# Patient Record
Sex: Female | Born: 1950
Health system: Southern US, Community
[De-identification: ages and names within clinical notes are randomized; demographics above are authoritative.]

## PROBLEM LIST (undated history)

## (undated) DIAGNOSIS — E785 Hyperlipidemia, unspecified: Secondary | ICD-10-CM

## (undated) DIAGNOSIS — F32A Depression, unspecified: Secondary | ICD-10-CM

## (undated) DIAGNOSIS — E7800 Pure hypercholesterolemia, unspecified: Secondary | ICD-10-CM

## (undated) DIAGNOSIS — Z974 Presence of external hearing-aid: Secondary | ICD-10-CM

## (undated) DIAGNOSIS — R112 Nausea with vomiting, unspecified: Secondary | ICD-10-CM

## (undated) DIAGNOSIS — M1612 Unilateral primary osteoarthritis, left hip: Secondary | ICD-10-CM

## (undated) DIAGNOSIS — Z860100 Personal history of colon polyps, unspecified: Secondary | ICD-10-CM

## (undated) DIAGNOSIS — M5412 Radiculopathy, cervical region: Secondary | ICD-10-CM

## (undated) DIAGNOSIS — B019 Varicella without complication: Secondary | ICD-10-CM

## (undated) DIAGNOSIS — R002 Palpitations: Secondary | ICD-10-CM

## (undated) DIAGNOSIS — D649 Anemia, unspecified: Secondary | ICD-10-CM

## (undated) DIAGNOSIS — I447 Left bundle-branch block, unspecified: Secondary | ICD-10-CM

## (undated) DIAGNOSIS — M1990 Unspecified osteoarthritis, unspecified site: Secondary | ICD-10-CM

## (undated) DIAGNOSIS — T753XXA Motion sickness, initial encounter: Secondary | ICD-10-CM

## (undated) DIAGNOSIS — N390 Urinary tract infection, site not specified: Secondary | ICD-10-CM

## (undated) DIAGNOSIS — F329 Major depressive disorder, single episode, unspecified: Secondary | ICD-10-CM

## (undated) DIAGNOSIS — Z9889 Other specified postprocedural states: Secondary | ICD-10-CM

## (undated) HISTORY — DX: Urinary tract infection, site not specified: N39.0

## (undated) HISTORY — DX: Major depressive disorder, single episode, unspecified: F32.9

## (undated) HISTORY — DX: Personal history of colonic polyps: Z86.010

## (undated) HISTORY — PX: JOINT REPLACEMENT: SHX530

## (undated) HISTORY — DX: Depression, unspecified: F32.A

## (undated) HISTORY — DX: Varicella without complication: B01.9

## (undated) HISTORY — PX: TUBAL LIGATION: SHX77

## (undated) HISTORY — DX: Personal history of colon polyps, unspecified: Z86.0100

---

## 1985-06-09 HISTORY — PX: ORIF ULNAR FRACTURE: SHX5417

## 1993-06-09 HISTORY — PX: TUBAL LIGATION: SHX77

## 2003-06-10 HISTORY — PX: BREAST SURGERY: SHX581

## 2005-06-09 HISTORY — PX: AUGMENTATION MAMMAPLASTY: SUR837

## 2010-08-28 ENCOUNTER — Ambulatory Visit: Payer: Self-pay | Admitting: Family Medicine

## 2011-06-23 ENCOUNTER — Ambulatory Visit: Payer: Self-pay | Admitting: Unknown Physician Specialty

## 2012-07-06 ENCOUNTER — Ambulatory Visit: Payer: Self-pay | Admitting: Family Medicine

## 2012-07-08 ENCOUNTER — Ambulatory Visit: Payer: Self-pay | Admitting: Family Medicine

## 2012-07-12 ENCOUNTER — Ambulatory Visit: Payer: Self-pay | Admitting: Family Medicine

## 2013-02-16 ENCOUNTER — Ambulatory Visit: Payer: Self-pay | Admitting: Family Medicine

## 2013-07-20 LAB — TSH: TSH: 2.4 u[IU]/mL (ref 0.41–5.90)

## 2013-07-20 LAB — BASIC METABOLIC PANEL
BUN: 12 mg/dL (ref 4–21)
Creatinine: 0.8 mg/dL (ref 0.5–1.1)
GLUCOSE: 91 mg/dL
SODIUM: 141 mmol/L (ref 137–147)

## 2013-07-20 LAB — CBC AND DIFFERENTIAL: WBC: 4.8 10*3/mL

## 2013-08-11 ENCOUNTER — Ambulatory Visit: Payer: Self-pay | Admitting: Family Medicine

## 2014-04-10 ENCOUNTER — Ambulatory Visit: Payer: Self-pay | Admitting: Obstetrics and Gynecology

## 2014-11-01 ENCOUNTER — Encounter: Payer: Self-pay | Admitting: Nurse Practitioner

## 2014-11-01 ENCOUNTER — Ambulatory Visit (INDEPENDENT_AMBULATORY_CARE_PROVIDER_SITE_OTHER): Payer: 59 | Admitting: Nurse Practitioner

## 2014-11-01 ENCOUNTER — Encounter (INDEPENDENT_AMBULATORY_CARE_PROVIDER_SITE_OTHER): Payer: Self-pay

## 2014-11-01 VITALS — BP 102/68 | HR 57 | Temp 98.0°F | Resp 16 | Ht 69.0 in | Wt 140.8 lb

## 2014-11-01 DIAGNOSIS — F324 Major depressive disorder, single episode, in partial remission: Secondary | ICD-10-CM | POA: Diagnosis not present

## 2014-11-01 DIAGNOSIS — F325 Major depressive disorder, single episode, in full remission: Secondary | ICD-10-CM

## 2014-11-01 DIAGNOSIS — R1011 Right upper quadrant pain: Secondary | ICD-10-CM

## 2014-11-01 DIAGNOSIS — Z7689 Persons encountering health services in other specified circumstances: Secondary | ICD-10-CM

## 2014-11-01 DIAGNOSIS — Z0000 Encounter for general adult medical examination without abnormal findings: Secondary | ICD-10-CM

## 2014-11-01 LAB — LIPID PANEL
Cholesterol: 219 mg/dL — ABNORMAL HIGH (ref 0–200)
HDL: 69.7 mg/dL (ref >39.00)
LDL CALC: 132 mg/dL — AB (ref 0–99)
NonHDL: 149.3
Total CHOL/HDL Ratio: 3
Triglycerides: 85 mg/dL (ref 0.0–149.0)
VLDL: 17 mg/dL (ref 0.0–40.0)

## 2014-11-01 LAB — COMPREHENSIVE METABOLIC PANEL
ALBUMIN: 4.1 g/dL (ref 3.5–5.2)
ALK PHOS: 48 U/L (ref 39–117)
ALT: 12 U/L (ref 0–35)
AST: 17 U/L (ref 0–37)
BUN: 10 mg/dL (ref 6–23)
CHLORIDE: 105 meq/L (ref 96–112)
CO2: 27 meq/L (ref 19–32)
Calcium: 9.3 mg/dL (ref 8.4–10.5)
Creatinine, Ser: 0.71 mg/dL (ref 0.40–1.20)
GFR: 88.47 mL/min (ref >60.00)
GLUCOSE: 99 mg/dL (ref 70–99)
Potassium: 4.3 mEq/L (ref 3.5–5.1)
SODIUM: 138 meq/L (ref 135–145)
Total Bilirubin: 1 mg/dL (ref 0.2–1.2)
Total Protein: 6.6 g/dL (ref 6.0–8.3)

## 2014-11-01 LAB — CBC WITH DIFFERENTIAL/PLATELET
Basophils Absolute: 0 10*3/uL (ref 0.0–0.1)
Basophils Relative: 0.6 % (ref 0.0–3.0)
EOS ABS: 0 10*3/uL (ref 0.0–0.7)
EOS PCT: 0.3 % (ref 0.0–5.0)
HEMATOCRIT: 33.4 % — AB (ref 36.0–46.0)
Hemoglobin: 11.5 g/dL — ABNORMAL LOW (ref 12.0–15.0)
Lymphocytes Relative: 31.5 % (ref 12.0–46.0)
Lymphs Abs: 1.7 10*3/uL (ref 0.7–4.0)
MCHC: 34.3 g/dL (ref 30.0–36.0)
MCV: 93.2 fl (ref 78.0–100.0)
MONOS PCT: 3.4 % (ref 3.0–12.0)
Monocytes Absolute: 0.2 10*3/uL (ref 0.1–1.0)
Neutro Abs: 3.4 10*3/uL (ref 1.4–7.7)
Neutrophils Relative %: 64.2 % (ref 43.0–77.0)
Platelets: 265 10*3/uL (ref 150.0–400.0)
RBC: 3.58 Mil/uL — ABNORMAL LOW (ref 3.87–5.11)
RDW: 13.3 % (ref 11.5–15.5)
WBC: 5.4 10*3/uL (ref 4.0–10.5)

## 2014-11-01 LAB — HEMOGLOBIN A1C: HEMOGLOBIN A1C: 5.6 % (ref 4.6–6.5)

## 2014-11-01 LAB — TSH: TSH: 1.53 u[IU]/mL (ref 0.35–4.50)

## 2014-11-01 NOTE — Progress Notes (Signed)
Pre visit review using our clinic review tool, if applicable. No additional management support is needed unless otherwise documented below in the visit note. 

## 2014-11-01 NOTE — Patient Instructions (Signed)
See you in 1 year for your annual physical.   You can call anytime you have concerns or illness.  Please visit the lab before leaving today.   Welcome to [facility]!

## 2014-11-01 NOTE — Progress Notes (Signed)
Subjective:    Patient ID: Angelica Newton, female    DOB: [birth year], 64 y.o.   MRN: [id number]  HPI  Angelica Newton is a 64 yo female establishing care and need of physical. Does not need PAP or breast exam today.   1) Health Maintenance-   Diet- Does not eat a lot of red meat   Exercise- Spinning 3-4 x a week, 1 hour, 1 hour other gym times   Immunizations- Flu 2016    tdap 2015   Mammogram- 2015, November- Dense breast tissue (Goes with ultrasound screenings)   Pap- 2015, normal results    Bone Density- Over 6 years ago   Colonoscopy- Due last year, insurance wanted her to go to     [place].   Eye Exam- 02/2014, glasses   Dental Exam- UTD  2) Chronic Problems-  Depression- Went through a divorce, was on Lexapro. Now   improved after divorce.   Feels like turtle neck on intermittently for years- no problems ever   found after work up by another provider  Feels RUQ "getting her attention", no episodes recently, also had   worked up with no findings.   Review of Systems  Constitutional: Positive for chills and fatigue. Negative for fever and diaphoresis.       Intermittent fatigue and chills 2 weeks ago   HENT: Negative for tinnitus and trouble swallowing.        Bilateral hearing aids  Eyes: Negative for visual disturbance.  Respiratory: Negative for chest tightness, shortness of breath and wheezing.   Cardiovascular: Negative for chest pain, palpitations and leg swelling.  Gastrointestinal: Negative for nausea, vomiting, diarrhea and constipation.  Genitourinary: Negative for difficulty urinating.  Musculoskeletal: Negative for back pain and neck pain.  Skin: Negative for rash.  Neurological: Negative for dizziness, syncope, weakness, numbness and headaches.  Hematological: Does not bruise/bleed easily.  Psychiatric/Behavioral: Negative for suicidal ideas and sleep disturbance. The patient is not nervous/anxious.    Past Medical History  Diagnosis Date  . Chicken pox     . Depression   . UTI (urinary tract infection)     History   Social History  . Marital Status: Single    Spouse Name: N/A  . Number of Children: N/A  . Years of Education: N/A   Occupational History  . Not on file.   Social History Main Topics  . Smoking status: Former Smoker    Types: Cigarettes    Quit date: 06/09/2010  . Smokeless tobacco: Never Used  . Alcohol Use: 0.6 oz/week    1 Glasses of wine per week  . Drug Use: No  . Sexual Activity:    Partners: Male   Other Topics Concern  . Not on file   Social History Narrative   Retired, but volunteers    Lives by herself   Children- 2 (2 and 74 yo)    Pets: None    Caffeine: 2 cups in the morning, diet coke rarely    Right handed    Enjoys bike riding, cooking, traveling        Past Surgical History  Procedure Laterality Date  . Orif ulnar fracture  1987    Broken Ulna/plates and screws  . Breast surgery  2005    Breast Lift    Family History  Problem Relation Age of Onset  . Hyperlipidemia Mother   . Stroke Mother   . Diabetes Mother   . Hyperlipidemia Father   . Stroke Father   .  Hypertension Father   . Kidney failure Father   . Diabetes Father   . Cancer Brother     squamous cell carcinoma  . Diabetes Maternal Grandmother   . Cancer Paternal Grandmother     Breast    Allergies  Allergen Reactions  . Tetracyclines & Related Other (See Comments)    Just felt lousey  . Zocor [Simvastatin] Other (See Comments)    Flu type symptoms  . Mango Butter Rash    Swollen lips    No current outpatient prescriptions on file prior to visit.   No current facility-administered medications on file prior to visit.      Objective:   Physical Exam  Constitutional: She is oriented to person, place, and time. She appears well-developed and well-nourished. No distress.  BP 102/68 mmHg  Pulse 57  Temp(Src) 98 F (36.7 C)  Resp 16  Ht 5\' 9"  (1.753 m)  Wt 140 lb 12.8 oz (63.866 kg)  BMI 20.78 kg/m2   SpO2 99%   HENT:  Head: Normocephalic and atraumatic.  Right Ear: External ear normal.  Left Ear: External ear normal.  Nose: Nose normal.  Mouth/Throat: Oropharynx is clear and moist. No oropharyngeal exudate.  TMs and canals clear bilaterally  Eyes: Conjunctivae and EOM are normal. Pupils are equal, round, and reactive to light. Right eye exhibits no discharge. Left eye exhibits no discharge. No scleral icterus.  Neck: Normal range of motion. Neck supple. No thyromegaly present.  Cardiovascular: Normal rate, regular rhythm, normal heart sounds and intact distal pulses.  Exam reveals no gallop and no friction rub.   No murmur heard. Pulmonary/Chest: Effort normal and breath sounds normal. No respiratory distress. She has no wheezes. She has no rales. She exhibits no tenderness.  Abdominal: Soft. Bowel sounds are normal. She exhibits no distension and no mass. There is no tenderness. There is no rebound and no guarding.  Genitourinary:  Deferred  Musculoskeletal: Normal range of motion. She exhibits no edema or tenderness.  Lymphadenopathy:    She has no cervical adenopathy.  Neurological: She is alert and oriented to person, place, and time. She has normal reflexes. No cranial nerve deficit. She exhibits normal muscle tone. Coordination normal.  Skin: Skin is warm and dry. No rash noted. She is not diaphoretic. No erythema. No pallor.  Psychiatric: She has a normal mood and affect. Her behavior is normal. Judgment and thought content normal.      Assessment & Plan:

## 2014-11-16 DIAGNOSIS — F325 Major depressive disorder, single episode, in full remission: Secondary | ICD-10-CM | POA: Insufficient documentation

## 2014-11-16 DIAGNOSIS — Z7689 Persons encountering health services in other specified circumstances: Secondary | ICD-10-CM | POA: Insufficient documentation

## 2014-11-16 DIAGNOSIS — R1011 Right upper quadrant pain: Secondary | ICD-10-CM | POA: Insufficient documentation

## 2014-11-16 DIAGNOSIS — Z0000 Encounter for general adult medical examination without abnormal findings: Secondary | ICD-10-CM | POA: Insufficient documentation

## 2014-11-16 NOTE — Assessment & Plan Note (Signed)
Will obtain records from previous facility. Patient came for physical and did not need breast or PAP exam today.

## 2014-11-16 NOTE — Assessment & Plan Note (Signed)
No problems today. Improved. Asked pt to follow up if flares in the future.

## 2014-11-16 NOTE — Assessment & Plan Note (Signed)
Discussed acute and chronic issues. Reviewed health maintenance measures, PFSHx, and immunizations. Obtain routine labs TSH, Lipid panel, CBC w/ diff, A1c, and CMET.

## 2014-11-16 NOTE — Assessment & Plan Note (Signed)
Patient improved and no longer on medication. States her divorced helped her.

## 2015-01-10 ENCOUNTER — Telehealth: Payer: Self-pay | Admitting: *Deleted

## 2015-01-10 NOTE — Telephone Encounter (Signed)
Patient called and was wanting a referral to [facility] Dr. Evorn Gong. Pt states she has a mole on Rt side that is bothering her. Pt states that she mentioned it during her Annual physical.  . Pt is requesting a call back if anything comes up.  She will be out of town her contact # is [phone]. Please advise NP

## 2015-01-11 ENCOUNTER — Other Ambulatory Visit: Payer: Self-pay | Admitting: Nurse Practitioner

## 2015-01-11 DIAGNOSIS — D229 Melanocytic nevi, unspecified: Secondary | ICD-10-CM

## 2015-01-29 ENCOUNTER — Ambulatory Visit (INDEPENDENT_AMBULATORY_CARE_PROVIDER_SITE_OTHER): Payer: 59 | Admitting: Nurse Practitioner

## 2015-01-29 VITALS — BP 116/82 | HR 57 | Temp 98.1°F | Resp 16 | Ht 69.0 in | Wt 143.6 lb

## 2015-01-29 DIAGNOSIS — M79671 Pain in right foot: Secondary | ICD-10-CM

## 2015-01-29 NOTE — Progress Notes (Signed)
Patient ID: Jowana Thumma, female    DOB: 04-09-1951  Age: 64 y.o. MRN: [id number]  CC: Foot Pain   HPI Deltha Bernales presents for right foot pain x 7 days.   1) Bottom of foot tingling, right foot 1 week No treatment to date. No history of diabetes. Patient reports no other symptoms or changes to skin  History [place] has a past medical history of Chicken pox; Depression; and UTI (urinary tract infection).   She has past surgical history that includes ORIF ulnar fracture (8101) and Breast surgery (2005).   Her family history includes Cancer in her brother and paternal grandmother; Diabetes in her father, maternal grandmother, and mother; Hyperlipidemia in her father and mother; Hypertension in her father; Kidney failure in her father; Stroke in her father and mother.She reports that she quit smoking about 4 years ago. Her smoking use included Cigarettes. She has never used smokeless tobacco. She reports that she drinks about 0.6 oz of alcohol per week. She reports that she does not use illicit drugs.  Outpatient Prescriptions Prior to Visit  Medication Sig Dispense Refill  . calcium-vitamin D (OSCAL WITH D) 250-125 MG-UNIT per tablet Take 1 tablet by mouth daily.    . cholecalciferol (VITAMIN D) 1000 UNITS tablet Take 1,000 Units by mouth daily.    . LUTEIN PO Take 1 tablet by mouth daily.     No facility-administered medications prior to visit.    ROS Review of Systems  Constitutional: Negative for fever, chills, diaphoresis and fatigue.  Musculoskeletal: Positive for arthralgias.       Right foot is tingling  Neurological: Positive for numbness.       Right foot    Objective:  BP 116/82 mmHg  Pulse 57  Temp(Src) 98.1 F (36.7 C)  Resp 16  Ht 5\' 9"  (1.753 m)  Wt 143 lb 9.6 oz (65.137 kg)  BMI 21.20 kg/m2  SpO2 97%  Physical Exam  Constitutional: She is oriented to person, place, and time. She appears well-developed and well-nourished. No distress.   HENT:  Head: Normocephalic and atraumatic.  Right Ear: External ear normal.  Left Ear: External ear normal.  Neurological: She is alert and oriented to person, place, and time. No cranial nerve deficit. She exhibits normal muscle tone. Coordination normal.  Monofilament exam is normal on right  Skin: Skin is warm and dry. No rash noted. She is not diaphoretic.  Psychiatric: She has a normal mood and affect. Her behavior is normal. Judgment and thought content normal.   Assessment & Plan:   There are no diagnoses linked to this encounter. I am having Ms. Fonte maintain her cholecalciferol, LUTEIN PO, and calcium-vitamin D.  No orders of the defined types were placed in this encounter.     Follow-up: No Follow-up on file.

## 2015-01-29 NOTE — Progress Notes (Signed)
Pre visit review using our clinic review tool, if applicable. No additional management support is needed unless otherwise documented below in the visit note. 

## 2015-01-29 NOTE — Patient Instructions (Signed)
Rest, Ice, and Advil/ibuprofen/Aspirin as needed   Call me if this worsens or persists over 7 more days.

## 2015-02-08 ENCOUNTER — Encounter: Payer: Self-pay | Admitting: Nurse Practitioner

## 2015-02-08 DIAGNOSIS — M79671 Pain in right foot: Secondary | ICD-10-CM | POA: Insufficient documentation

## 2015-02-08 NOTE — Assessment & Plan Note (Signed)
Uncontrolled. Patient and I discussed possible treatment methods. Patient decided to go with rest, ice, ibuprofen. Asked patient to call in 7 days if not improving.

## 2015-05-10 ENCOUNTER — Ambulatory Visit (INDEPENDENT_AMBULATORY_CARE_PROVIDER_SITE_OTHER): Payer: 59 | Admitting: Nurse Practitioner

## 2015-05-10 ENCOUNTER — Encounter: Payer: Self-pay | Admitting: Nurse Practitioner

## 2015-05-10 VITALS — BP 118/72 | HR 55 | Temp 98.0°F | Wt 142.0 lb

## 2015-05-10 DIAGNOSIS — E785 Hyperlipidemia, unspecified: Secondary | ICD-10-CM | POA: Diagnosis not present

## 2015-05-10 DIAGNOSIS — D649 Anemia, unspecified: Secondary | ICD-10-CM | POA: Insufficient documentation

## 2015-05-10 DIAGNOSIS — Z23 Encounter for immunization: Secondary | ICD-10-CM

## 2015-05-10 LAB — CBC WITH DIFFERENTIAL/PLATELET
BASOS PCT: 0.8 % (ref 0.0–3.0)
Basophils Absolute: 0 10*3/uL (ref 0.0–0.1)
Eosinophils Absolute: 0.1 10*3/uL (ref 0.0–0.7)
Eosinophils Relative: 1.3 % (ref 0.0–5.0)
HEMATOCRIT: 38.4 % (ref 36.0–46.0)
HEMOGLOBIN: 12.8 g/dL (ref 12.0–15.0)
LYMPHS PCT: 45 % (ref 12.0–46.0)
Lymphs Abs: 1.9 10*3/uL (ref 0.7–4.0)
MCHC: 33.3 g/dL (ref 30.0–36.0)
MCV: 95.4 fl (ref 78.0–100.0)
MONO ABS: 0.3 10*3/uL (ref 0.1–1.0)
Monocytes Relative: 6.3 % (ref 3.0–12.0)
Neutro Abs: 2 10*3/uL (ref 1.4–7.7)
Neutrophils Relative %: 46.6 % (ref 43.0–77.0)
PLATELETS: 228 10*3/uL (ref 150.0–400.0)
RBC: 4.03 Mil/uL (ref 3.87–5.11)
RDW: 13.3 % (ref 11.5–15.5)
WBC: 4.3 10*3/uL (ref 4.0–10.5)

## 2015-05-10 LAB — LIPID PANEL
CHOL/HDL RATIO: 3
Cholesterol: 204 mg/dL — ABNORMAL HIGH (ref 0–200)
HDL: 63.2 mg/dL (ref >39.00)
LDL CALC: 127 mg/dL — AB (ref 0–99)
NONHDL: 140.34
Triglycerides: 65 mg/dL (ref 0.0–149.0)
VLDL: 13 mg/dL (ref 0.0–40.0)

## 2015-05-10 NOTE — Progress Notes (Signed)
Patient ID: [name], female    DOB: [birth year]  Age: 64 y.o. MRN: [id number]  CC: Follow-up   HPI Angelica Newton presents for follow up of lipids and cbc w/ diff, flu vaccine and CC of neck spasm.   1) Put x-mas tree in trunk Sharp pain in neck. Has improved on its own today without anything. Mother in town for 10 days and she reports there is stress related to this.   2)Repeat cholesterol and CBC w/ diff today Patient is fasting She reports giving blood regularly and is due to go Sat. She reports she has never had that problem before with being turned down due to low hgb.   History Angelica Newton has a past medical history of Chicken pox; Depression; and UTI (urinary tract infection).   She has past surgical history that includes ORIF ulnar fracture [id number]) and Breast surgery (2005).   Her family history includes Cancer in her brother and paternal grandmother; Diabetes in her father, maternal grandmother, and mother; Hyperlipidemia in her father and mother; Hypertension in her father; Kidney failure in her father; Stroke in her father and mother.She reports that she quit smoking about 4 years ago. Her smoking use included Cigarettes. She has never used smokeless tobacco. She reports that she drinks about 0.6 oz of alcohol per week. She reports that she does not use illicit drugs.  Outpatient Prescriptions Prior to Visit  Medication Sig Dispense Refill  . calcium-vitamin D (OSCAL WITH D) 250-125 MG-UNIT per tablet Take 1 tablet by mouth daily.    . cholecalciferol (VITAMIN D) 1000 UNITS tablet Take 1,000 Units by mouth daily.    . LUTEIN PO Take 1 tablet by mouth daily.     No facility-administered medications prior to visit.    ROS Review of Systems  Constitutional: Negative for fever, chills, diaphoresis and fatigue.  Respiratory: Negative for chest tightness, shortness of breath and wheezing.   Cardiovascular: Negative for chest pain, palpitations and leg swelling.   Gastrointestinal: Negative for nausea, vomiting and diarrhea.  Musculoskeletal: Positive for myalgias.       Muscle pain around left shoulder  Skin: Negative for rash.  Neurological: Negative for dizziness, weakness, numbness and headaches.  Psychiatric/Behavioral: The patient is not nervous/anxious.     Objective:  BP 118/72 mmHg  Pulse 55  Temp(Src) 98 F (36.7 C) (Oral)  Wt 142 lb (64.411 kg)  SpO2 98%  Physical Exam  Constitutional: She is oriented to person, place, and time. She appears well-developed and well-nourished. No distress.  HENT:  Head: Normocephalic and atraumatic.  Right Ear: External ear normal.  Left Ear: External ear normal.  Cardiovascular: Normal rate, regular rhythm and normal heart sounds.  Exam reveals no gallop and no friction rub.   No murmur heard. Pulmonary/Chest: Effort normal and breath sounds normal. No respiratory distress. She has no wheezes. She has no rales. She exhibits no tenderness.  Musculoskeletal: Normal range of motion. She exhibits tenderness. She exhibits no edema.  Slightly tender to palpation Left Trapezius muscle. No tenderness of shoulder joint or scapula. Full ROM left shoulder and neck  Neurological: She is alert and oriented to person, place, and time. No cranial nerve deficit. She exhibits normal muscle tone. Coordination normal.  Skin: Skin is warm and dry. No rash noted. She is not diaphoretic.  Psychiatric: She has a normal mood and affect. Her behavior is normal. Judgment and thought content normal.   Assessment & Plan:   Angelica Newton was seen today for  follow-up.  Diagnoses and all orders for this visit:  Hyperlipidemia -     Lipid Profile  Anemia, unspecified anemia type -     CBC with Differential/Platelet  Need for prophylactic vaccination and inoculation against influenza -     Flu Vaccine QUAD 36+ mos IM   I am having Ms. Cuellar maintain her cholecalciferol, LUTEIN PO, and calcium-vitamin D.  No orders of  the defined types were placed in this encounter.     Follow-up: Return in about 1 year (around 05/09/2016) for CPE w/ fasting labs.

## 2015-05-10 NOTE — Assessment & Plan Note (Signed)
Repeat lipid panel today. Pt is not on therapy at this time and is a fairly healthy eater. Will follow after results

## 2015-05-10 NOTE — Patient Instructions (Addendum)
Please visit the lab today.   See your for physical next year and Happy Holidays!

## 2015-05-10 NOTE — Assessment & Plan Note (Addendum)
Pt had anemia on last CBC w/ diff. Will repeat today. Denies bleeding from any orifice. Does bruise easily she reports. She gives blood regularly (next time is Sat), and reports they have not had any concerns with anemia.

## 2015-08-27 ENCOUNTER — Encounter: Payer: Self-pay | Admitting: Nurse Practitioner

## 2015-08-27 ENCOUNTER — Ambulatory Visit (INDEPENDENT_AMBULATORY_CARE_PROVIDER_SITE_OTHER): Payer: 59 | Admitting: Nurse Practitioner

## 2015-08-27 VITALS — BP 112/72 | HR 66 | Temp 98.1°F | Resp 14 | Ht 69.0 in | Wt 144.2 lb

## 2015-08-27 DIAGNOSIS — M76891 Other specified enthesopathies of right lower limb, excluding foot: Secondary | ICD-10-CM

## 2015-08-27 DIAGNOSIS — Z1211 Encounter for screening for malignant neoplasm of colon: Secondary | ICD-10-CM | POA: Diagnosis not present

## 2015-08-27 DIAGNOSIS — M65851 Other synovitis and tenosynovitis, right thigh: Secondary | ICD-10-CM

## 2015-08-27 DIAGNOSIS — Z1239 Encounter for other screening for malignant neoplasm of breast: Secondary | ICD-10-CM

## 2015-08-27 NOTE — Patient Instructions (Signed)
[facility]  Address: [address] [place], [place] [address]  Phone: [phone] Hours:  Monday - Thursday: 8 a.m. - 5 p.m. Friday: 8 a.m. - 3 p.m.   We will call about your colonoscopy.   Follow up in 3 months.

## 2015-08-27 NOTE — Progress Notes (Signed)
Patient ID: Angelica Newton, female    DOB: Mar 22, 1951  Age: 65 y.o. MRN: [id number]  CC: Follow-up and Hip Pain   HPI Zarahi Foor presents for CC of colonoscopy need, mammogram, and right hip pain.   1) Colonoscopy- Dr. Allen Norris or Vira Agar   2) Right hip  Family history of hip arthritis  Trouble tying shoes  Aleve, ice, stretching, topicals without relief   3) Wants to order mammogram  History [place] has a past medical history of Chicken pox; Depression; and UTI (urinary tract infection).   She has past surgical history that includes ORIF ulnar fracture [id number]) and Breast surgery (2005).   Her family history includes Cancer in her brother and paternal grandmother; Diabetes in her father, maternal grandmother, and mother; Hyperlipidemia in her father and mother; Hypertension in her father; Kidney failure in her father; Stroke in her father and mother.She reports that she quit smoking about 5 years ago. Her smoking use included Cigarettes. She has never used smokeless tobacco. She reports that she drinks about 0.6 oz of alcohol per week. She reports that she does not use illicit drugs.  Outpatient Prescriptions Prior to Visit  Medication Sig Dispense Refill  . calcium-vitamin D (OSCAL WITH D) 250-125 MG-UNIT per tablet Take 1 tablet by mouth daily.    . cholecalciferol (VITAMIN D) 1000 UNITS tablet Take 1,000 Units by mouth daily. Reported on 08/27/2015    . LUTEIN PO Take 1 tablet by mouth daily. Reported on 08/27/2015     No facility-administered medications prior to visit.    ROS Review of Systems  Constitutional: Negative for fever, chills, diaphoresis and fatigue.  Respiratory: Negative for chest tightness, shortness of breath and wheezing.   Cardiovascular: Negative for chest pain, palpitations and leg swelling.  Gastrointestinal: Negative for nausea, vomiting and diarrhea.  Skin: Negative for rash.  Neurological: Negative for dizziness and headaches.     Objective:  BP 112/72 mmHg  Pulse 66  Temp(Src) 98.1 F (36.7 C) (Oral)  Resp 14  Ht 5\' 9"  (1.753 m)  Wt 144 lb 3.2 oz (65.409 kg)  BMI 21.29 kg/m2  SpO2 97%  Physical Exam  Constitutional: She is oriented to person, place, and time. She appears well-developed and well-nourished. No distress.  HENT:  Head: Normocephalic and atraumatic.  Right Ear: External ear normal.  Left Ear: External ear normal.  Cardiovascular: Normal rate and regular rhythm.   Pulmonary/Chest: Effort normal and breath sounds normal. No respiratory distress. She has no wheezes. She has no rales. She exhibits no tenderness.  Neurological: She is alert and oriented to person, place, and time. No cranial nerve deficit. She exhibits normal muscle tone. Coordination normal.  Skin: Skin is warm and dry. No rash noted. She is not diaphoretic.  Psychiatric: She has a normal mood and affect. Her behavior is normal. Judgment and thought content normal.    Assessment & Plan:   Angelica Newton was seen today for follow-up and hip pain.  Diagnoses and all orders for this visit:  Screening for breast cancer -     MM SCREENING BREAST TOMO BILATERAL; Future  Special screening for malignant neoplasms, colon -     Ambulatory referral to Gastroenterology   I am having Ms. Montigny maintain her cholecalciferol, LUTEIN PO, and calcium-vitamin D.  No orders of the defined types were placed in this encounter.     Follow-up: Return in about 3 months (around 11/27/2015) for Follow up for right hip and epigastric pain.

## 2015-09-02 DIAGNOSIS — Z0001 Encounter for general adult medical examination with abnormal findings: Secondary | ICD-10-CM | POA: Insufficient documentation

## 2015-09-02 DIAGNOSIS — Z1211 Encounter for screening for malignant neoplasm of colon: Secondary | ICD-10-CM | POA: Insufficient documentation

## 2015-09-02 DIAGNOSIS — M25551 Pain in right hip: Secondary | ICD-10-CM | POA: Insufficient documentation

## 2015-09-02 NOTE — Assessment & Plan Note (Signed)
Tight hip flexors and likely a tendinitis  Light stretching ice and NSAIDs

## 2015-09-02 NOTE — Assessment & Plan Note (Signed)
Ordered mammogram.

## 2015-09-02 NOTE — Assessment & Plan Note (Signed)
Amb referral for colonoscopy placed

## 2015-09-05 ENCOUNTER — Telehealth: Payer: Self-pay

## 2015-09-05 ENCOUNTER — Other Ambulatory Visit: Payer: Self-pay

## 2015-09-05 NOTE — Telephone Encounter (Signed)
Gastroenterology Pre-Procedure Review  Request Date: 09/17/15 Requesting Physician: Lorane Gell, NP  PATIENT REVIEW QUESTIONS: The patient responded to the following health history questions as indicated:    1. Are you having any GI issues? no 2. Do you have a personal history of Polyps? yes (repeat every 10 years) 3. Do you have a family history of Colon Cancer or Polyps? yes (Maternal Aunt, colon cancer- unsure) 4. Diabetes Mellitus? no 5. Joint replacements in the past 12 months?no 6. Major health problems in the past 3 months?no 7. Any artificial heart valves, MVP, or defibrillator?no    MEDICATIONS & ALLERGIES:    Patient reports the following regarding taking any anticoagulation/antiplatelet therapy:   Plavix, Coumadin, Eliquis, Xarelto, Lovenox, Pradaxa, Brilinta, or Effient? no Aspirin? no  Patient confirms/reports the following medications:  Current Outpatient Prescriptions  Medication Sig Dispense Refill  . calcium-vitamin D (OSCAL WITH D) 250-125 MG-UNIT per tablet Take 1 tablet by mouth daily.    . cholecalciferol (VITAMIN D) 1000 UNITS tablet Take 1,000 Units by mouth daily. Reported on 08/27/2015    . LUTEIN PO Take 1 tablet by mouth daily. Reported on 08/27/2015     No current facility-administered medications for this visit.    Patient confirms/reports the following allergies:  Allergies  Allergen Reactions  . Lipitor [Atorvastatin]     "feels terrible"  . Tetracyclines & Related Other (See Comments)    Just felt lousey  . Zocor [Simvastatin] Other (See Comments)    Flu type symptoms  . Mango Butter Rash    Swollen lips    No orders of the defined types were placed in this encounter.    AUTHORIZATION INFORMATION Primary Insurance: 1D#: Group #:  Secondary Insurance: 1D#: Group #:  SCHEDULE INFORMATION: Date: 09/17/15 Time: Location: [facility]

## 2015-09-12 ENCOUNTER — Encounter: Payer: Self-pay | Admitting: *Deleted

## 2015-09-13 NOTE — Discharge Instructions (Signed)

## 2015-09-17 ENCOUNTER — Ambulatory Visit: Payer: No Typology Code available for payment source | Admitting: Anesthesiology

## 2015-09-17 ENCOUNTER — Encounter
Admission: RE | Disposition: A | Discharge status: Home / Self Care | Payer: Self-pay | Source: Ambulatory Visit | Attending: Gastroenterology

## 2015-09-17 ENCOUNTER — Ambulatory Visit
Admission: RE | Admit: 2015-09-17 | Discharge: 2015-09-17 | Disposition: A | Discharge status: Home / Self Care | Payer: No Typology Code available for payment source | Source: Ambulatory Visit | Attending: Gastroenterology | Admitting: Gastroenterology

## 2015-09-17 DIAGNOSIS — Z881 Allergy status to other antibiotic agents status: Secondary | ICD-10-CM | POA: Insufficient documentation

## 2015-09-17 DIAGNOSIS — F329 Major depressive disorder, single episode, unspecified: Secondary | ICD-10-CM | POA: Diagnosis not present

## 2015-09-17 DIAGNOSIS — Z8249 Family history of ischemic heart disease and other diseases of the circulatory system: Secondary | ICD-10-CM | POA: Diagnosis not present

## 2015-09-17 DIAGNOSIS — Z808 Family history of malignant neoplasm of other organs or systems: Secondary | ICD-10-CM | POA: Diagnosis not present

## 2015-09-17 DIAGNOSIS — Z1211 Encounter for screening for malignant neoplasm of colon: Secondary | ICD-10-CM | POA: Diagnosis not present

## 2015-09-17 DIAGNOSIS — Z888 Allergy status to other drugs, medicaments and biological substances status: Secondary | ICD-10-CM | POA: Insufficient documentation

## 2015-09-17 DIAGNOSIS — D125 Benign neoplasm of sigmoid colon: Secondary | ICD-10-CM | POA: Insufficient documentation

## 2015-09-17 DIAGNOSIS — M1611 Unilateral primary osteoarthritis, right hip: Secondary | ICD-10-CM | POA: Diagnosis not present

## 2015-09-17 DIAGNOSIS — Z91018 Allergy to other foods: Secondary | ICD-10-CM | POA: Insufficient documentation

## 2015-09-17 DIAGNOSIS — Z841 Family history of disorders of kidney and ureter: Secondary | ICD-10-CM | POA: Diagnosis not present

## 2015-09-17 DIAGNOSIS — Z87891 Personal history of nicotine dependence: Secondary | ICD-10-CM | POA: Insufficient documentation

## 2015-09-17 DIAGNOSIS — Z823 Family history of stroke: Secondary | ICD-10-CM | POA: Insufficient documentation

## 2015-09-17 DIAGNOSIS — H9193 Unspecified hearing loss, bilateral: Secondary | ICD-10-CM | POA: Diagnosis not present

## 2015-09-17 DIAGNOSIS — Z803 Family history of malignant neoplasm of breast: Secondary | ICD-10-CM | POA: Insufficient documentation

## 2015-09-17 DIAGNOSIS — Z833 Family history of diabetes mellitus: Secondary | ICD-10-CM | POA: Diagnosis not present

## 2015-09-17 DIAGNOSIS — Z79899 Other long term (current) drug therapy: Secondary | ICD-10-CM | POA: Insufficient documentation

## 2015-09-17 HISTORY — DX: Presence of external hearing-aid: Z97.4

## 2015-09-17 HISTORY — DX: Unspecified osteoarthritis, unspecified site: M19.90

## 2015-09-17 HISTORY — DX: Other specified postprocedural states: Z98.890

## 2015-09-17 HISTORY — DX: Motion sickness, initial encounter: T75.3XXA

## 2015-09-17 HISTORY — DX: Other specified postprocedural states: R11.2

## 2015-09-17 HISTORY — PX: COLONOSCOPY WITH PROPOFOL: SHX5780

## 2015-09-17 HISTORY — PX: POLYPECTOMY: SHX149

## 2015-09-17 SURGERY — COLONOSCOPY WITH PROPOFOL
Anesthesia: Monitor Anesthesia Care | Wound class: Contaminated

## 2015-09-17 MED ORDER — OXYCODONE HCL 5 MG PO TABS: 5.0000 mg | ORAL_TABLET | Freq: Once | ORAL | Status: DC | PRN

## 2015-09-17 MED ORDER — LACTATED RINGERS IV SOLN
INTRAVENOUS | Status: DC
  Administered 2015-09-17: 07:00:00 via INTRAVENOUS

## 2015-09-17 MED ORDER — LIDOCAINE HCL (CARDIAC) 20 MG/ML IV SOLN
INTRAVENOUS | Status: DC | PRN
  Administered 2015-09-17: 40 mg via INTRAVENOUS

## 2015-09-17 MED ORDER — LACTATED RINGERS IV SOLN: INTRAVENOUS | Status: DC

## 2015-09-17 MED ORDER — OXYCODONE HCL 5 MG/5ML PO SOLN: 5.0000 mg | Freq: Once | ORAL | Status: DC | PRN

## 2015-09-17 MED ORDER — PROPOFOL 10 MG/ML IV BOLUS
INTRAVENOUS | Status: DC | PRN
  Administered 2015-09-17 (×2): 20 mg via INTRAVENOUS
  Administered 2015-09-17: 30 mg via INTRAVENOUS
  Administered 2015-09-17 (×3): 20 mg via INTRAVENOUS
  Administered 2015-09-17: 70 mg via INTRAVENOUS
  Administered 2015-09-17: 20 mg via INTRAVENOUS

## 2015-09-17 SURGICAL SUPPLY — 21 items
CANISTER SUCT 1200ML W/VALVE (MISCELLANEOUS) ×4 IMPLANT
CLIP HMST 235XBRD CATH ROT (MISCELLANEOUS) IMPLANT
CLIP RESOLUTION 360 11X235 (MISCELLANEOUS)
FCP ESCP3.2XJMB 240X2.8X (MISCELLANEOUS)
FORCEPS BIOP RAD 4 LRG CAP 4 (CUTTING FORCEPS) IMPLANT
FORCEPS BIOP RJ4 240 W/NDL (MISCELLANEOUS)
FORCEPS ESCP3.2XJMB 240X2.8X (MISCELLANEOUS) IMPLANT
GOWN CVR UNV OPN BCK APRN NK (MISCELLANEOUS) ×4 IMPLANT
GOWN ISOL THUMB LOOP REG UNIV (MISCELLANEOUS) ×4
INJECTOR VARIJECT VIN23 (MISCELLANEOUS) IMPLANT
KIT DEFENDO VALVE AND CONN (KITS) IMPLANT
KIT ENDO PROCEDURE OLY (KITS) ×4 IMPLANT
MARKER SPOT ENDO TATTOO 5ML (MISCELLANEOUS) IMPLANT
PAD GROUND ADULT SPLIT (MISCELLANEOUS) IMPLANT
PROBE APC STR FIRE (PROBE) ×4 IMPLANT
SNARE SHORT THROW 13M SML OVAL (MISCELLANEOUS) ×4 IMPLANT
SNARE SHORT THROW 30M LRG OVAL (MISCELLANEOUS) IMPLANT
SPOT EX ENDOSCOPIC TATTOO (MISCELLANEOUS)
VARIJECT INJECTOR VIN23 (MISCELLANEOUS)
WATER STERILE IRR 250ML POUR (IV SOLUTION) ×4 IMPLANT
WIDE-EYE POLYPTRAP (MISCELLANEOUS) ×4 IMPLANT

## 2015-09-17 NOTE — Anesthesia Procedure Notes (Signed)
Procedure Name: MAC Performed by: Delailah Spieth Pre-anesthesia Checklist: Patient identified, Emergency Drugs available, Suction available, Timeout performed and Patient being monitored Patient Re-evaluated:Patient Re-evaluated prior to inductionOxygen Delivery Method: Nasal cannula Placement Confirmation: positive ETCO2       

## 2015-09-17 NOTE — H&P (Signed)
[facility]  [address], [place], [place] [address] Phone: [phone] Fax : [phone]  Primary Care Physician:  Rubbie Battiest, NP Primary Gastroenterologist:  Dr. Allen Norris  Pre-Procedure History & Physical: HPI:  Angelica Newton is a 65 y.o. female is here for a screening colonoscopy.   Past Medical History  Diagnosis Date  . Chicken pox   . Depression   . UTI (urinary tract infection)   . PONV (postoperative nausea and vomiting)   . Motion sickness     in past  . Arthritis     Right hip  . Wears hearing aid     bilateral    Past Surgical History  Procedure Laterality Date  . Orif ulnar fracture  1987    Broken Ulna/plates and screws  . Breast surgery  2005    Breast Lift  . Tubal ligation      Prior to Admission medications   Medication Sig Start Date End Date Taking? Authorizing Provider  calcium-vitamin D (OSCAL WITH D) 250-125 MG-UNIT per tablet Take 1 tablet by mouth daily.   Yes Historical Provider, MD  cholecalciferol (VITAMIN D) 1000 UNITS tablet Take 1,000 Units by mouth daily. Reported on 08/27/2015   Yes Historical Provider, MD  LUTEIN PO Take 1 tablet by mouth daily. Reported on 08/27/2015   Yes Historical Provider, MD    Allergies as of 09/05/2015 - Review Complete 09/05/2015  Allergen Reaction Noted  . Lipitor [atorvastatin]  05/10/2015  . Tetracyclines & related Other (See Comments) 11/01/2014  . Zocor [simvastatin] Other (See Comments) 11/01/2014  . Mango butter Rash 11/01/2014    Family History  Problem Relation Age of Onset  . Hyperlipidemia Mother   . Stroke Mother   . Diabetes Mother   . Hyperlipidemia Father   . Stroke Father   . Hypertension Father   . Kidney failure Father   . Diabetes Father   . Cancer Brother     squamous cell carcinoma  . Diabetes Maternal Grandmother   . Cancer Paternal Grandmother     Breast    Social History   Social History  . Marital Status: Single    Spouse Name: N/A    . Number of Children: N/A  . Years of Education: N/A   Occupational History  . Not on file.   Social History Main Topics  . Smoking status: Former Smoker    Types: Cigarettes    Quit date: 06/09/2010  . Smokeless tobacco: Never Used  . Alcohol Use: 6.0 oz/week    10 Glasses of wine per week  . Drug Use: No  . Sexual Activity:    Partners: Male   Other Topics Concern  . Not on file   Social History Narrative   Retired, but volunteers    Lives by herself   Children- 2 (79 and 60 yo)    Pets: None    Caffeine: 2 cups in the morning, diet coke rarely    Right handed    Enjoys bike riding, cooking, traveling        Review of Systems: See HPI, otherwise negative ROS  Physical Exam: BP 108/72 mmHg  Pulse 63  Temp(Src) 97.5 F (36.4 C) (Temporal)  Resp 16  Ht 5\' 9"  (1.753 m)  Wt 143 lb (64.864 kg)  BMI 21.11 kg/m2  SpO2 100% General:   Alert,  pleasant and cooperative in NAD Head:  Normocephalic and atraumatic. Neck:  Supple; no masses or thyromegaly. Lungs:  Clear  throughout to auscultation.    Heart:  Regular rate and rhythm. Abdomen:  Soft, nontender and nondistended. Normal bowel sounds, without guarding, and without rebound.   Neurologic:  Alert and  oriented x4;  grossly normal neurologically.  Impression/Plan: Angelica Newton is now here to undergo a screening colonoscopy.  Risks, benefits, and alternatives regarding colonoscopy have been reviewed with the patient.  Questions have been answered.  All parties agreeable.

## 2015-09-17 NOTE — Transfer of Care (Signed)
Immediate Anesthesia Transfer of Care Note  Patient: Angelica Newton  Procedure(s) Performed: Procedure(s) with comments: COLONOSCOPY WITH PROPOFOL (N/A) POLYPECTOMY INTESTINAL - Sigmoid colon polyp  Patient Location: PACU  Anesthesia Type: MAC  Level of Consciousness: awake, alert  and patient cooperative  Airway and Oxygen Therapy: Patient Spontanous Breathing and Patient connected to supplemental oxygen  Post-op Assessment: Post-op Vital signs reviewed, Patient's Cardiovascular Status Stable, Respiratory Function Stable, Patent Airway and No signs of Nausea or vomiting  Post-op Vital Signs: Reviewed and stable  Complications: No apparent anesthesia complications

## 2015-09-17 NOTE — Anesthesia Postprocedure Evaluation (Signed)
Anesthesia Post Note  Patient: Angelica Newton  Procedure(s) Performed: Procedure(s) (LRB): COLONOSCOPY WITH PROPOFOL (N/A) POLYPECTOMY INTESTINAL  Patient location during evaluation: PACU Anesthesia Type: MAC Level of consciousness: awake and alert Pain management: pain level controlled Vital Signs Assessment: post-procedure vital signs reviewed and stable Respiratory status: spontaneous breathing and respiratory function stable Cardiovascular status: stable Anesthetic complications: no    Jaci Standard, III,  Rett Stehlik D

## 2015-09-17 NOTE — Op Note (Signed)
[facility] Gastroenterology Patient Name: Angelica Newton Procedure Date: 09/17/2015 8:26 AM MRN: [id number] Account #: [id number] Date of Birth: 29-Feb-1951 Admit Type: Outpatient Age: 65 Room: [facility] OR ROOM 01 Gender: Female Note Status: Finalized Procedure:            Colonoscopy Indications:          Screening for colorectal malignant neoplasm Providers:            Lucilla Lame, MD Referring MD:         Velora Heckler. Doss (Referring MD) Medicines:            Propofol per Anesthesia Complications:        No immediate complications. Procedure:            Pre-Anesthesia Assessment:                       - Prior to the procedure, a History and Physical was                        performed, and patient medications and allergies were                        reviewed. The patient's tolerance of previous                        anesthesia was also reviewed. The risks and benefits of                        the procedure and the sedation options and risks were                        discussed with the patient. All questions were                        answered, and informed consent was obtained. Prior                        Anticoagulants: The patient has taken no previous                        anticoagulant or antiplatelet agents. ASA Grade                        Assessment: II - A patient with mild systemic disease.                        After reviewing the risks and benefits, the patient was                        deemed in satisfactory condition to undergo the                        procedure.                       After obtaining informed consent, the colonoscope was                        passed under direct vision. Throughout the procedure,  the patient's blood pressure, pulse, and oxygen                        saturations were monitored continuously. The Olympus CF                        H180AL colonoscope (S#: [id number]) was introduced through                      the anus and advanced to the the cecum, identified by                        appendiceal orifice and ileocecal valve. The                        colonoscopy was performed without difficulty. The                        patient tolerated the procedure well. The quality of                        the bowel preparation was excellent. Findings:      The perianal and digital rectal examinations were normal.      A 7 mm polyp was found in the sigmoid colon. The polyp was pedunculated.       The polyp was removed with a cold snare. Resection and retrieval were       complete. Impression:           - One 7 mm polyp in the sigmoid colon, removed with a                        cold snare. Resected and retrieved. Recommendation:       - Await pathology results.                       - Repeat colonoscopy in 5 years if polyp adenoma and 10                        years if hyperplastic Procedure Code(s):    --- Professional ---                       [phone], Colonoscopy, flexible; with removal of tumor(s),                        polyp(s), or other lesion(s) by snare technique Diagnosis Code(s):    --- Professional ---                       Z12.11, Encounter for screening for malignant neoplasm                        of colon                       D12.5, Benign neoplasm of sigmoid colon CPT copyright 2016 American Medical Association. All rights reserved. The codes documented in this report are preliminary and upon coder review may  be revised to meet current compliance requirements. Lucilla Lame, MD 09/17/2015 8:47:07 AM This report has been signed electronically. Number of Addenda: 0 Note Initiated On:  09/17/2015 8:26 AM Scope Withdrawal Time: 0 hours 6 minutes 28 seconds  Total Procedure Duration: 0 hours 11 minutes 16 seconds       [facility]

## 2015-09-17 NOTE — Anesthesia Preprocedure Evaluation (Signed)
Anesthesia Evaluation  Patient identified by MRN, date of birth, ID band Patient awake    Reviewed: Allergy & Precautions, H&P , NPO status , Patient's Chart, lab work & pertinent test results  History of Anesthesia Complications (+) history of anesthetic complications (ponv)  Airway Mallampati: I  TM Distance: >3 FB Neck ROM: full    Dental no notable dental hx.    Pulmonary neg pulmonary ROS, former smoker,    Pulmonary exam normal        Cardiovascular Normal cardiovascular exam     Neuro/Psych    GI/Hepatic negative GI ROS, Neg liver ROS,   Endo/Other  negative endocrine ROS  Renal/GU negative Renal ROS     Musculoskeletal   Abdominal   Peds  Hematology negative hematology ROS (+)   Anesthesia Other Findings   Reproductive/Obstetrics                             Anesthesia Physical Anesthesia Plan  ASA: I  Anesthesia Plan: MAC   Post-op Pain Management:    Induction:   Airway Management Planned:   Additional Equipment:   Intra-op Plan:   Post-operative Plan:   Informed Consent: I have reviewed the patients History and Physical, chart, labs and discussed the procedure including the risks, benefits and alternatives for the proposed anesthesia with the patient or authorized representative who has indicated his/her understanding and acceptance.     Plan Discussed with: CRNA  Anesthesia Plan Comments:         Anesthesia Quick Evaluation

## 2015-09-18 ENCOUNTER — Encounter: Payer: Self-pay | Admitting: Gastroenterology

## 2015-09-26 ENCOUNTER — Ambulatory Visit
Admission: RE | Admit: 2015-09-26 | Discharge: 2015-09-26 | Disposition: A | Discharge status: Home / Self Care | Payer: No Typology Code available for payment source | Source: Ambulatory Visit | Attending: Nurse Practitioner | Admitting: Nurse Practitioner

## 2015-09-26 DIAGNOSIS — Z1239 Encounter for other screening for malignant neoplasm of breast: Secondary | ICD-10-CM

## 2015-09-26 DIAGNOSIS — Z1231 Encounter for screening mammogram for malignant neoplasm of breast: Secondary | ICD-10-CM | POA: Insufficient documentation

## 2015-11-28 ENCOUNTER — Ambulatory Visit (INDEPENDENT_AMBULATORY_CARE_PROVIDER_SITE_OTHER): Payer: No Typology Code available for payment source

## 2015-11-28 ENCOUNTER — Ambulatory Visit (INDEPENDENT_AMBULATORY_CARE_PROVIDER_SITE_OTHER): Payer: No Typology Code available for payment source | Admitting: Family Medicine

## 2015-11-28 ENCOUNTER — Ambulatory Visit: Payer: 59 | Admitting: Nurse Practitioner

## 2015-11-28 ENCOUNTER — Encounter: Payer: Self-pay | Admitting: Family Medicine

## 2015-11-28 VITALS — BP 102/62 | HR 69 | Temp 98.2°F | Ht 69.0 in | Wt 143.1 lb

## 2015-11-28 DIAGNOSIS — M25551 Pain in right hip: Secondary | ICD-10-CM

## 2015-11-28 DIAGNOSIS — E785 Hyperlipidemia, unspecified: Secondary | ICD-10-CM | POA: Diagnosis not present

## 2015-11-28 DIAGNOSIS — R21 Rash and other nonspecific skin eruption: Secondary | ICD-10-CM | POA: Diagnosis not present

## 2015-11-28 NOTE — Assessment & Plan Note (Signed)
Suspect rash was contact dermatitis related to sunscreen patient use. Advised to avoid that sunscreen. Continue monitor as it is improving. Given return precautions.

## 2015-11-28 NOTE — Progress Notes (Signed)
Patient ID: Angelica Newton, female   DOB: 27-Aug-1950, 65 y.o.   MRN: [id number]  Angelica Rumps, MD Phone: [phone]  Angelica Newton is a 65 y.o. female who presents today for follow-up.  Hyperlipidemia: No longer on medications. Mildly elevated LDL. She works on diet and exercise. Exercises 5 days a week by riding a bike. Fish oil. Fruit. Little red meat. Lots of lean cuisine. No longer smoking. No chest pain, shortness of breath, or myalgias.  Right hip pain: Chronic issue. Hurts with lifting leg at the hip. Not debilitating. Notes strong family history. Has never had an x-ray of her hip.  Rash: Patient reports rash on legs and arms from applying sunscreen when in the [place] recently. Was very itchy. Has improved significantly since returning from the [place]. Did take some ibuprofen for this.  PMH: former smoker   ROS see history of present illness  Objective  Physical Exam Filed Vitals:   11/28/15 1340  BP: 102/62  Pulse: 69  Temp: 98.2 F (36.8 C)    BP Readings from Last 3 Encounters:  11/28/15 102/62  09/17/15 109/61  08/27/15 112/72   Wt Readings from Last 3 Encounters:  11/28/15 143 lb 1.6 oz (64.91 kg)  09/17/15 143 lb (64.864 kg)  08/27/15 144 lb 3.2 oz (65.409 kg)    Physical Exam  Constitutional: She is well-developed, well-nourished, and in no distress.  HENT:  Head: Normocephalic and atraumatic.  Right Ear: External ear normal.  Left Ear: External ear normal.  Cardiovascular: Normal rate, regular rhythm and normal heart sounds.   Pulmonary/Chest: Effort normal and breath sounds normal.  Musculoskeletal:  No hip tenderness bilaterally, right hip with discomfort on internal range of motion and external range of motion, discomfort on hip flexor strength testing, left hip with no discomfort on range of motion, 5 out of 5 strength in bilateral hip flexors, quads, hamstrings, plantar flexion, and dorsiflexion, sensation light touch intact  bilateral extremities  Neurological: She is alert. Gait normal.  Skin: Skin is warm and dry. She is not diaphoretic.  Minimal faint erythematous papules still noticeable     Assessment/Plan: Please see individual problem list.  Right hip pain Given exam suspect right hip pain is due to osteoarthritis. Will obtain an x-ray of her right hip and pelvis to determine extent of arthritis.  Hyperlipidemia Has not tolerated statins. Is quite active with exercise. Diet is fairly good. Discussed continuing to monitor and rechecking cholesterol in 6 months.  Rash and nonspecific skin eruption Suspect rash was contact dermatitis related to sunscreen patient use. Advised to avoid that sunscreen. Continue monitor as it is improving. Given return precautions.    Orders Placed This Encounter  Procedures  . DG HIP UNILAT WITH PELVIS 2-3 VIEWS RIGHT    Standing Status: Future     Number of Occurrences: 1     Standing Expiration Date: 01/27/2017    Order Specific Question:  Reason for Exam (SYMPTOM  OR DIAGNOSIS REQUIRED)    Answer:  right hip pain, discomfort on internal ROM    Order Specific Question:  Preferred imaging location?    Answer:  [facility]    Angelica Rumps, MD [place]

## 2015-11-28 NOTE — Assessment & Plan Note (Signed)
Has not tolerated statins. Is quite active with exercise. Diet is fairly good. Discussed continuing to monitor and rechecking cholesterol in 6 months.

## 2015-11-28 NOTE — Patient Instructions (Signed)
Nice to meet you. We will obtain an x-ray of your right hip to evaluate further. Please continue diet and exercise. Please avoid sunscreen negative rash. If you develop recurrent rash, worsening hip pain, chest pain or shortness of breath, bleeding, or any new or changing symptoms please seek medical attention.

## 2015-11-28 NOTE — Progress Notes (Signed)
Pre visit review using our clinic review tool, if applicable. No additional management support is needed unless otherwise documented below in the visit note. 

## 2015-11-28 NOTE — Assessment & Plan Note (Signed)
Given exam suspect right hip pain is due to osteoarthritis. Will obtain an x-ray of her right hip and pelvis to determine extent of arthritis.

## 2016-05-07 ENCOUNTER — Encounter: Payer: Self-pay | Admitting: Family Medicine

## 2016-05-07 ENCOUNTER — Ambulatory Visit (INDEPENDENT_AMBULATORY_CARE_PROVIDER_SITE_OTHER): Payer: No Typology Code available for payment source | Admitting: Family Medicine

## 2016-05-07 ENCOUNTER — Other Ambulatory Visit: Payer: 59

## 2016-05-07 ENCOUNTER — Other Ambulatory Visit (HOSPITAL_COMMUNITY)
Admission: RE | Admit: 2016-05-07 | Discharge: 2016-05-07 | Disposition: A | Discharge status: Home / Self Care | Payer: No Typology Code available for payment source | Source: Ambulatory Visit | Attending: Family Medicine | Admitting: Family Medicine

## 2016-05-07 VITALS — BP 118/68 | HR 66 | Temp 98.4°F | Wt 143.8 lb

## 2016-05-07 DIAGNOSIS — Z01419 Encounter for gynecological examination (general) (routine) without abnormal findings: Secondary | ICD-10-CM | POA: Diagnosis present

## 2016-05-07 DIAGNOSIS — Z0000 Encounter for general adult medical examination without abnormal findings: Secondary | ICD-10-CM | POA: Diagnosis not present

## 2016-05-07 DIAGNOSIS — Z1151 Encounter for screening for human papillomavirus (HPV): Secondary | ICD-10-CM | POA: Insufficient documentation

## 2016-05-07 DIAGNOSIS — E785 Hyperlipidemia, unspecified: Secondary | ICD-10-CM

## 2016-05-07 DIAGNOSIS — Z130 Encounter for screening for diseases of the blood and blood-forming organs and certain disorders involving the immune mechanism: Secondary | ICD-10-CM

## 2016-05-07 DIAGNOSIS — Z1329 Encounter for screening for other suspected endocrine disorder: Secondary | ICD-10-CM

## 2016-05-07 DIAGNOSIS — Z124 Encounter for screening for malignant neoplasm of cervix: Secondary | ICD-10-CM

## 2016-05-07 LAB — COMPREHENSIVE METABOLIC PANEL
ALK PHOS: 45 U/L (ref 39–117)
ALT: 17 U/L (ref 0–35)
AST: 22 U/L (ref 0–37)
Albumin: 4.5 g/dL (ref 3.5–5.2)
BUN: 17 mg/dL (ref 6–23)
CHLORIDE: 105 meq/L (ref 96–112)
CO2: 28 mEq/L (ref 19–32)
Calcium: 9.5 mg/dL (ref 8.4–10.5)
Creatinine, Ser: 0.88 mg/dL (ref 0.40–1.20)
GFR: 68.72 mL/min (ref >60.00)
GLUCOSE: 87 mg/dL (ref 70–99)
POTASSIUM: 4.1 meq/L (ref 3.5–5.1)
SODIUM: 141 meq/L (ref 135–145)
TOTAL PROTEIN: 7.1 g/dL (ref 6.0–8.3)
Total Bilirubin: 0.8 mg/dL (ref 0.2–1.2)

## 2016-05-07 LAB — LIPID PANEL
Cholesterol: 248 mg/dL — ABNORMAL HIGH (ref 0–200)
HDL: 68 mg/dL (ref >39.00)
LDL Cholesterol: 162 mg/dL — ABNORMAL HIGH (ref 0–99)
NONHDL: 179.51
Total CHOL/HDL Ratio: 4
Triglycerides: 89 mg/dL (ref 0.0–149.0)
VLDL: 17.8 mg/dL (ref 0.0–40.0)

## 2016-05-07 LAB — TSH: TSH: 1.89 u[IU]/mL (ref 0.35–4.50)

## 2016-05-07 LAB — CBC
HEMATOCRIT: 38.2 % (ref 36.0–46.0)
HEMOGLOBIN: 13 g/dL (ref 12.0–15.0)
MCHC: 33.9 g/dL (ref 30.0–36.0)
MCV: 94.5 fl (ref 78.0–100.0)
Platelets: 239 10*3/uL (ref 150.0–400.0)
RBC: 4.04 Mil/uL (ref 3.87–5.11)
RDW: 13.4 % (ref 11.5–15.5)
WBC: 6.1 10*3/uL (ref 4.0–10.5)

## 2016-05-07 NOTE — Assessment & Plan Note (Addendum)
Doing well overall. Healthy weight. Blood pressure well controlled. Health maintenance up-to-date with exception of flu shot. She will return after she sees if her sniffles improve to get a flu shot. We will obtain lab work as outlined below. Pap smear completed. Breast exam completed. Most recent mammogram reviewed. She will monitor for recurrence of the catch that she described. She has a benign exam and has not had any recurrence.

## 2016-05-07 NOTE — Progress Notes (Signed)
Pre visit review using our clinic review tool, if applicable. No additional management support is needed unless otherwise documented below in the visit note. 

## 2016-05-07 NOTE — Progress Notes (Signed)
Tommi Rumps, MD Phone: [phone]  Angelica Newton is a 65 y.o. female who presents today for physical exam.  Diet is described as pretty good. Does not eat much red meat. She eats oatmeal and eggs. Does not eat out. Exercises by spinning 4 times a week. Last Pap smear about 3 years ago. Mammogram and colonoscopy up-to-date. Vaccinations up-to-date. With exception of flu shot. Patient does report some sniffles over the last 3 days with a mild sore throat. She reports no other symptoms with this. Has not progressed since Sunday. Hepatitis C and HIV testing up-to-date. Former cigarette smoker. Quit many years ago. Drinks 1-2 glasses of wine several times a week. No illicit drug use. Sees a dentist and ophthalmologist.  Patient reports 1-2 months ago she developed a catch in her right side while she was in spin class. Notes it felt as though she was ovulating at that time. Lasted for a day and when away on its own. She had no other associated symptoms with it. No nausea or vomiting or diarrhea with it. No vaginal bleeding. Has not recurred since then.  Active Ambulatory Problems    Diagnosis Date Noted  . Routine general medical examination at a health care facility 11/16/2014  . Major depression in remission ([place]) 11/16/2014  . RUQ pain 11/16/2014  . Right foot pain 02/08/2015  . Hyperlipidemia 05/10/2015  . Absolute anemia 05/10/2015  . Screening for breast cancer 09/02/2015  . Special screening for malignant neoplasms, colon 09/02/2015  . Right hip pain 09/02/2015  . Benign neoplasm of sigmoid colon   . Rash and nonspecific skin eruption 11/28/2015   Resolved Ambulatory Problems    Diagnosis Date Noted  . Encounter to establish care 11/16/2014   Past Medical History:  Diagnosis Date  . Arthritis   . Chicken pox   . Depression   . Motion sickness   . PONV (postoperative nausea and vomiting)   . UTI (urinary tract infection)   . Wears hearing aid     Family  History  Problem Relation Age of Onset  . Hyperlipidemia Mother   . Stroke Mother   . Diabetes Mother   . Hyperlipidemia Father   . Stroke Father   . Hypertension Father   . Kidney failure Father   . Diabetes Father   . Cancer Brother     squamous cell carcinoma  . Diabetes Maternal Grandmother   . Cancer Paternal Grandmother     Breast  . Breast cancer Paternal Aunt     Social History   Social History  . Marital status: Single    Spouse name: N/A  . Number of children: N/A  . Years of education: N/A   Occupational History  . Not on file.   Social History Main Topics  . Smoking status: Former Smoker    Types: Cigarettes    Quit date: 06/09/2010  . Smokeless tobacco: Never Used  . Alcohol use 6.0 oz/week    10 Glasses of wine per week  . Drug use: No  . Sexual activity: Yes    Partners: Male   Other Topics Concern  . Not on file   Social History Narrative   Retired, but volunteers    Lives by herself   Children- 2 (37 and 29 yo)    Pets: None    Caffeine: 2 cups in the morning, diet coke rarely    Right handed    Enjoys bike riding, cooking, traveling  ROS  General:  Negative for nexplained weight loss, fever Skin: Negative for new or changing mole, sore that won't heal HEENT: Negative for trouble hearing, trouble seeing, ringing in ears, mouth sores, hoarseness, change in voice, dysphagia. CV:  Negative for chest pain, dyspnea, edema, palpitations Resp: Negative for cough, dyspnea, hemoptysis GI: Negative for nausea, vomiting, diarrhea, constipation, abdominal pain, melena, hematochezia. GU: Negative for dysuria, incontinence, urinary hesitance, hematuria, vaginal or penile discharge, polyuria, sexual difficulty, lumps in testicle or breasts MSK: Negative for muscle cramps or aches, joint pain or swelling Neuro: Negative for headaches, weakness, numbness, dizziness, passing out/fainting Psych: Negative for depression, anxiety, memory  problems  Objective  Physical Exam Vitals:   05/07/16 0806  BP: 118/68  Pulse: 66  Temp: 98.4 F (36.9 C)    BP Readings from Last 3 Encounters:  05/07/16 118/68  11/28/15 102/62  09/17/15 109/61   Wt Readings from Last 3 Encounters:  05/07/16 143 lb 12.8 oz (65.2 kg)  11/28/15 143 lb 1.6 oz (64.9 kg)  09/17/15 143 lb (64.9 kg)    Physical Exam  Constitutional: She is well-developed, well-nourished, and in no distress.  HENT:  Head: Normocephalic and atraumatic.  Mouth/Throat: Oropharynx is clear and moist.  Eyes: Conjunctivae are normal. Pupils are equal, round, and reactive to light.  Cardiovascular: Normal rate, regular rhythm and normal heart sounds.   Pulmonary/Chest: Effort normal and breath sounds normal.  Abdominal: Soft. Bowel sounds are normal. She exhibits no distension. There is no tenderness. There is no rebound and no guarding.  Genitourinary: Cervix normal.  Genitourinary Comments: Mildly atrophic vaginal mucosa, normal cervix, normal bimanual exam, bilateral breasts with nipple inversion that the patient reports is chronic and unchanged, no skin changes, no masses palpated, no axillary masses bilaterally  Musculoskeletal: She exhibits no edema.  Neurological: She is alert. Gait normal.  Skin: Skin is warm and dry.  Psychiatric: Mood and affect normal.     Assessment/Plan:   Routine general medical examination at a health care facility Doing well overall. Healthy weight. Blood pressure well controlled. Health maintenance up-to-date with exception of flu shot. She will return after she sees if her sniffles improve to get a flu shot. We will obtain lab work as outlined below. Pap smear completed. Breast exam completed. Most recent mammogram reviewed. She will monitor for recurrence of the catch that she described. She has a benign exam and has not had any recurrence.   Orders Placed This Encounter  Procedures  . Lipid panel  . Comp Met (CMET)  . CBC   . TSH    No orders of the defined types were placed in this encounter.    Tommi Rumps, MD [place]

## 2016-05-07 NOTE — Patient Instructions (Signed)
Nice to see you. We will check some lab work and call with the results. Please monitor your sniffles and if they progress please let us know. If they improve or do not progress you can come back for your flu shot. Please continue diet and exercise. Please monitor for recurrence of a catch in her right side. If this occurs please let us know.

## 2016-05-09 ENCOUNTER — Encounter: Payer: 59 | Admitting: Nurse Practitioner

## 2016-05-09 ENCOUNTER — Other Ambulatory Visit: Payer: Self-pay | Admitting: Family Medicine

## 2016-05-09 LAB — CYTOLOGY - PAP
Diagnosis: NEGATIVE
HPV (WINDOPATH): NOT DETECTED

## 2016-05-09 MED ORDER — EZETIMIBE 10 MG PO TABS: 10.0000 mg | ORAL_TABLET | Freq: Every day | ORAL | 3 refills | Status: DC

## 2017-04-05 DIAGNOSIS — Z23 Encounter for immunization: Secondary | ICD-10-CM | POA: Diagnosis not present

## 2017-05-02 ENCOUNTER — Other Ambulatory Visit: Payer: Self-pay | Admitting: Family Medicine

## 2017-05-08 ENCOUNTER — Encounter: Payer: Self-pay | Admitting: Family Medicine

## 2017-05-08 ENCOUNTER — Other Ambulatory Visit: Payer: Self-pay

## 2017-05-08 ENCOUNTER — Ambulatory Visit (INDEPENDENT_AMBULATORY_CARE_PROVIDER_SITE_OTHER): Payer: Medicare Other | Admitting: Family Medicine

## 2017-05-08 VITALS — BP 118/64 | HR 60 | Temp 97.8°F | Ht 69.0 in | Wt 148.0 lb

## 2017-05-08 DIAGNOSIS — R42 Dizziness and giddiness: Secondary | ICD-10-CM | POA: Insufficient documentation

## 2017-05-08 DIAGNOSIS — H539 Unspecified visual disturbance: Secondary | ICD-10-CM

## 2017-05-08 DIAGNOSIS — I951 Orthostatic hypotension: Secondary | ICD-10-CM | POA: Diagnosis not present

## 2017-05-08 DIAGNOSIS — Z0001 Encounter for general adult medical examination with abnormal findings: Secondary | ICD-10-CM

## 2017-05-08 DIAGNOSIS — Z8669 Personal history of other diseases of the nervous system and sense organs: Secondary | ICD-10-CM

## 2017-05-08 DIAGNOSIS — E785 Hyperlipidemia, unspecified: Secondary | ICD-10-CM | POA: Diagnosis not present

## 2017-05-08 DIAGNOSIS — Z780 Asymptomatic menopausal state: Secondary | ICD-10-CM

## 2017-05-08 DIAGNOSIS — Z23 Encounter for immunization: Secondary | ICD-10-CM

## 2017-05-08 DIAGNOSIS — Z1239 Encounter for other screening for malignant neoplasm of breast: Secondary | ICD-10-CM

## 2017-05-08 LAB — COMPREHENSIVE METABOLIC PANEL
ALBUMIN: 4.3 g/dL (ref 3.5–5.2)
ALK PHOS: 45 U/L (ref 39–117)
ALT: 26 U/L (ref 0–35)
AST: 28 U/L (ref 0–37)
BILIRUBIN TOTAL: 0.8 mg/dL (ref 0.2–1.2)
BUN: 14 mg/dL (ref 6–23)
CALCIUM: 9.5 mg/dL (ref 8.4–10.5)
CO2: 28 mEq/L (ref 19–32)
CREATININE: 0.9 mg/dL (ref 0.40–1.20)
Chloride: 105 mEq/L (ref 96–112)
GFR: 66.75 mL/min (ref >60.00)
Glucose, Bld: 95 mg/dL (ref 70–99)
Potassium: 4.7 mEq/L (ref 3.5–5.1)
Sodium: 139 mEq/L (ref 135–145)
Total Protein: 7.2 g/dL (ref 6.0–8.3)

## 2017-05-08 LAB — LIPID PANEL
CHOLESTEROL: 226 mg/dL — AB (ref 0–200)
HDL: 75 mg/dL (ref >39.00)
LDL CALC: 139 mg/dL — AB (ref 0–99)
NonHDL: 151.36
TRIGLYCERIDES: 62 mg/dL (ref 0.0–149.0)
Total CHOL/HDL Ratio: 3
VLDL: 12.4 mg/dL (ref 0.0–40.0)

## 2017-05-08 LAB — CBC
HCT: 36.8 % (ref 36.0–46.0)
Hemoglobin: 12.1 g/dL (ref 12.0–15.0)
MCHC: 32.9 g/dL (ref 30.0–36.0)
MCV: 94.5 fl (ref 78.0–100.0)
Platelets: 305 10*3/uL (ref 150.0–400.0)
RBC: 3.9 Mil/uL (ref 3.87–5.11)
RDW: 13.5 % (ref 11.5–15.5)
WBC: 4.4 10*3/uL (ref 4.0–10.5)

## 2017-05-08 MED ORDER — PNEUMOCOCCAL 13-VAL CONJ VACC IM SUSP: 0.5000 mL | INTRAMUSCULAR | 0 refills | Status: DC

## 2017-05-08 MED ORDER — EZETIMIBE 10 MG PO TABS: 10.0000 mg | ORAL_TABLET | Freq: Every day | ORAL | 3 refills | Status: DC

## 2017-05-08 NOTE — Assessment & Plan Note (Signed)
Physical exam completed.  Encouraged diet and exercise.  Mammogram ordered.  DEXA scan ordered.  Prevnar given.  Lab work as outlined below.  Other health maintenance up-to-date.

## 2017-05-08 NOTE — Assessment & Plan Note (Signed)
Normal on vision screening today.  She will monitor and if not improving follow-up with her eye doctor.

## 2017-05-08 NOTE — Progress Notes (Signed)
Tommi Rumps, MD Phone: [phone]  Angelica Newton is a 66 y.o. female who presents today for physical exam.  Exercises 3 times a week at the gym. Diet has not been quite as good recently though she did stop eating cereal.  She eats plenty of fruits and vegetables.  Tuna and eggs for protein. Pap smear last year was negative.  She reports no abnormal Pap smears over the last 10 years.  Did have an abnormal one many years ago.  No history of cervical cancer Colonoscopy last year.  5-year recall. DEXA scan due. Prevnar due. Tdap up-to-date.  Hepatitis C up-to-date.  HIV screening up-to-date.  Flu vaccine up-to-date. No tobacco use or illicit drug use.  6-8 alcoholic beverages a week.  Patient reports 3 or 4 times recently she has felt lightheaded.  It particularly occurs when she stands up or after she is still up and walked for short period of time.  Resolves quickly.  Does not occur all the time.  Orthostatic today.  She reports some occasional blurry vision particularly when she looks at the TV.  She last saw the eye doctor in June of this year  Active Ambulatory Problems    Diagnosis Date Noted  . Major depression in remission ([place]) 11/16/2014  . RUQ pain 11/16/2014  . Right foot pain 02/08/2015  . Hyperlipidemia 05/10/2015  . Absolute anemia 05/10/2015  . Encounter for general adult medical examination with abnormal findings 09/02/2015  . Special screening for malignant neoplasms, colon 09/02/2015  . Right hip pain 09/02/2015  . Benign neoplasm of sigmoid colon   . Rash and nonspecific skin eruption 11/28/2015  . Light headedness 05/08/2017  . Vision changes 05/08/2017   Resolved Ambulatory Problems    Diagnosis Date Noted  . Encounter to establish care 11/16/2014  . Routine general medical examination at a health care facility 11/16/2014   Past Medical History:  Diagnosis Date  . Arthritis   . Chicken pox   . Depression   . Motion sickness   . PONV  (postoperative nausea and vomiting)   . UTI (urinary tract infection)   . Wears hearing aid     Family History  Problem Relation Age of Onset  . Hyperlipidemia Mother   . Stroke Mother   . Diabetes Mother   . Hyperlipidemia Father   . Stroke Father   . Hypertension Father   . Kidney failure Father   . Diabetes Father   . Cancer Brother        squamous cell carcinoma  . Diabetes Maternal Grandmother   . Cancer Paternal Grandmother        Breast  . Breast cancer Paternal Aunt     Social History   Socioeconomic History  . Marital status: Single    Spouse name: Not on file  . Number of children: Not on file  . Years of education: Not on file  . Highest education level: Not on file  Social Needs  . Financial resource strain: Not on file  . Food insecurity - worry: Not on file  . Food insecurity - inability: Not on file  . Transportation needs - medical: Not on file  . Transportation needs - non-medical: Not on file  Occupational History  . Not on file  Tobacco Use  . Smoking status: Former Smoker    Types: Cigarettes    Last attempt to quit: 06/09/2010    Years since quitting: 6.9  . Smokeless tobacco: Never Used  Substance and  Sexual Activity  . Alcohol use: Yes    Alcohol/week: 6.0 oz    Types: 10 Glasses of wine per week  . Drug use: No  . Sexual activity: Yes    Partners: Male  Other Topics Concern  . Not on file  Social History Narrative   Retired, but volunteers    Lives by herself   Children- 2 (53 and 11 yo)    Pets: None    Caffeine: 2 cups in the morning, diet coke rarely    Right handed    Enjoys bike riding, cooking, traveling     ROS  General:  Negative for nexplained weight loss, fever Skin: Negative for new or changing mole, sore that won't heal HEENT: Negative for trouble hearing, trouble seeing, ringing in ears, mouth sores, hoarseness, change in voice, dysphagia. CV: Positive for lightheadedness, negative for chest pain, dyspnea, edema,  palpitations Resp: Negative for cough, dyspnea, hemoptysis GI: Negative for nausea, vomiting, diarrhea, constipation, abdominal pain, melena, hematochezia. GU: Negative for dysuria, incontinence, urinary hesitance, hematuria, vaginal or penile discharge, polyuria, sexual difficulty, lumps in testicle or breasts MSK: Negative for muscle cramps or aches, joint pain or swelling Neuro: Negative for headaches, weakness, numbness, passing out/fainting Psych: Negative for depression, anxiety, memory problems  Objective  Physical Exam Vitals:   05/08/17 0759  BP: 118/64  Pulse: 60  Temp: 97.8 F (36.6 C)  SpO2: 99%   Laying blood pressure 130/70 pulse 61 Sitting blood pressure 112/70 pulse 59 Standing blood pressure 100/70  BP Readings from Last 3 Encounters:  05/08/17 118/64  05/07/16 118/68  11/28/15 102/62   Wt Readings from Last 3 Encounters:  05/08/17 148 lb (67.1 kg)  05/07/16 143 lb 12.8 oz (65.2 kg)  11/28/15 143 lb 1.6 oz (64.9 kg)    Physical Exam  Constitutional: No distress.  HENT:  Head: Normocephalic and atraumatic.  Mouth/Throat: Oropharynx is clear and moist. No oropharyngeal exudate.  Eyes: Conjunctivae are normal. Pupils are equal, round, and reactive to light.  Cardiovascular: Normal rate, regular rhythm and normal heart sounds.  Pulmonary/Chest: Effort normal and breath sounds normal.  Abdominal: Soft. Bowel sounds are normal. She exhibits no distension. There is no tenderness. There is no rebound and no guarding.  Genitourinary:  Genitourinary Comments: Bilateral nipples slightly inverted which the patient reports is been a chronic issue since she was very young no changes to the breasts per patient, skin changes or masses palpated, bilateral axilla with no masses  Musculoskeletal: She exhibits no edema.  Neurological: She is alert. Gait normal.  Skin: Skin is warm and dry. She is not diaphoretic.  Psychiatric: Mood and affect normal.      Assessment/Plan:   Encounter for general adult medical examination with abnormal findings Physical exam completed.  Encouraged diet and exercise.  Mammogram ordered.  DEXA scan ordered.  Prevnar given.  Lab work as outlined below.  Other health maintenance up-to-date.  Light headedness Orthostatic on exam.  Patient reports she does not drink very much water.  We will check lab work as below.  She will increase water intake to at least 8 glasses/day.  We will continue to monitor.  Vision changes Normal on vision screening today.  She will monitor and if not improving follow-up with her eye doctor.   Orders Placed This Encounter  Procedures  . DG Bone Density    Standing Status:   Future    Standing Expiration Date:   07/08/2018    Order Specific Question:  Reason for Exam (SYMPTOM  OR DIAGNOSIS REQUIRED)    Answer:   osteoporosis screening, post menopausal estrogen deficiency    Order Specific Question:   Preferred imaging location?    Answer:   [facility]  . MM SCREENING BREAST TOMO BILATERAL    Standing Status:   Future    Standing Expiration Date:   07/08/2018    Order Specific Question:   Reason for Exam (SYMPTOM  OR DIAGNOSIS REQUIRED)    Answer:   breast cancer screening    Order Specific Question:   Preferred imaging location?    Answer:   [facility]  . Comp Met (CMET)  . CBC  . Lipid panel    Meds ordered this encounter  Medications  . DISCONTD: pneumococcal 13-valent conjugate vaccine (PREVNAR 13) SUSP injection    Sig: Inject 0.5 mLs into the muscle tomorrow at 10 am for 1 dose.    Dispense:  0.5 mL    Refill:  0     Tommi Rumps, MD [place]

## 2017-05-08 NOTE — Assessment & Plan Note (Signed)
Orthostatic on exam.  Patient reports she does not drink very much water.  We will check lab work as below.  She will increase water intake to at least 8 glasses/day.  We will continue to monitor.

## 2017-05-08 NOTE — Patient Instructions (Addendum)
Nice to see you. Please work on diet and exercise. We will get you set up for a mammogram and bone density test. Please monitor your vision and if this does not improve please follow-up with your eye doctor. You need to drink at least 8 glasses of water per day to help with your lightheadedness.  If this does not improve please let us know.

## 2017-06-25 ENCOUNTER — Ambulatory Visit
Admission: RE | Admit: 2017-06-25 | Discharge: 2017-06-25 | Disposition: A | Discharge status: Home / Self Care | Payer: Medicare Other | Source: Ambulatory Visit | Attending: Family Medicine | Admitting: Family Medicine

## 2017-06-25 DIAGNOSIS — Z1231 Encounter for screening mammogram for malignant neoplasm of breast: Secondary | ICD-10-CM | POA: Insufficient documentation

## 2017-06-25 DIAGNOSIS — Z780 Asymptomatic menopausal state: Secondary | ICD-10-CM | POA: Insufficient documentation

## 2017-06-25 DIAGNOSIS — Z13820 Encounter for screening for osteoporosis: Secondary | ICD-10-CM | POA: Insufficient documentation

## 2017-06-25 DIAGNOSIS — M810 Age-related osteoporosis without current pathological fracture: Secondary | ICD-10-CM | POA: Insufficient documentation

## 2017-06-25 DIAGNOSIS — Z1239 Encounter for other screening for malignant neoplasm of breast: Secondary | ICD-10-CM

## 2017-07-20 ENCOUNTER — Encounter: Payer: Self-pay | Admitting: Family Medicine

## 2017-07-20 ENCOUNTER — Ambulatory Visit (INDEPENDENT_AMBULATORY_CARE_PROVIDER_SITE_OTHER): Payer: Medicare Other | Admitting: Family Medicine

## 2017-07-20 ENCOUNTER — Other Ambulatory Visit: Payer: Self-pay

## 2017-07-20 DIAGNOSIS — M810 Age-related osteoporosis without current pathological fracture: Secondary | ICD-10-CM | POA: Diagnosis not present

## 2017-07-20 DIAGNOSIS — H6983 Other specified disorders of Eustachian tube, bilateral: Secondary | ICD-10-CM

## 2017-07-20 DIAGNOSIS — H6990 Unspecified Eustachian tube disorder, unspecified ear: Secondary | ICD-10-CM | POA: Insufficient documentation

## 2017-07-20 DIAGNOSIS — H6980 Other specified disorders of Eustachian tube, unspecified ear: Secondary | ICD-10-CM | POA: Insufficient documentation

## 2017-07-20 NOTE — Assessment & Plan Note (Signed)
Discussed treatment options.  She does not want medication for this.  She will start weightbearing exercise.  Continue adequate calcium and vitamin D intake.

## 2017-07-20 NOTE — Assessment & Plan Note (Signed)
Symptoms most consistent with eustachian tube dysfunction.  She reports she saw ENT already and had her hearing aids checked.  Discussed trialing Claritin for this or Flonase.  I offered to have her go back to see ENT given the sensation that she occasionally has a turtle neck on despite nothing being on her neck though she deferred this.  I did discuss that I did not feel anything abnormal in her neck or see anything abnormal in her posterior oropharynx though I could not look down her throat or tell her what was going on inside of her neck.  She wanted to monitor and if she develops any other symptoms I would have her see ENT.

## 2017-07-20 NOTE — Progress Notes (Signed)
  Tommi Rumps, MD Phone: [phone]  Angelica Newton is a 67 y.o. female who presents today for follow-up.  Osteoporosis: Recently diagnosed on DEXA scan.  No prior medications.  No ulcer history.  Does have a history of broken bone in her 73s though none recently.  She is taking calcium and vitamin D.  Not doing weightbearing exercise.  She does not want to start on medication.  Reports her bilateral ears have intermittently felt plugged up for some time now.  She had her hearing aids checked and ENT advised her those were normal.  No tinnitus.  Occasional shooting pain in her right ear though nothing consistent.  Occasionally feels like she is wearing a turtleneck around her neck.  This is not a strangling sensation.  No trouble breathing or swallowing.  Notes that is come and gone for some time.  She reports she was previously advised it was related to allergies when she lived in [place].  Social History   Tobacco Use  Smoking Status Former Smoker  . Types: Cigarettes  . Last attempt to quit: 06/09/2010  . Years since quitting: 7.1  Smokeless Tobacco Never Used     ROS see history of present illness  Objective  Physical Exam Vitals:   07/20/17 1316  BP: 138/72  Pulse: (!) 56  Temp: 97.7 F (36.5 C)  SpO2: 98%    BP Readings from Last 3 Encounters:  07/20/17 138/72  05/08/17 118/64  05/07/16 118/68   Wt Readings from Last 3 Encounters:  07/20/17 147 lb (66.7 kg)  05/08/17 148 lb (67.1 kg)  05/07/16 143 lb 12.8 oz (65.2 kg)    Physical Exam  Constitutional: No distress.  HENT:  Head: Normocephalic and atraumatic.  Mouth/Throat: Oropharynx is clear and moist. No oropharyngeal exudate.  Normal TMs  Eyes: Conjunctivae are normal. Pupils are equal, round, and reactive to light.  Neck: Neck supple.  Cardiovascular: Normal rate, regular rhythm and normal heart sounds.  Pulmonary/Chest: Effort normal and breath sounds normal.  Musculoskeletal: She exhibits  no edema.  Lymphadenopathy:    She has no cervical adenopathy.  Neurological: She is alert. Gait normal.  Skin: Skin is warm and dry. She is not diaphoretic.     Assessment/Plan: Please see individual problem list.  Osteoporosis Discussed treatment options.  She does not want medication for this.  She will start weightbearing exercise.  Continue adequate calcium and vitamin D intake.  Eustachian tube dysfunction Symptoms most consistent with eustachian tube dysfunction.  She reports she saw ENT already and had her hearing aids checked.  Discussed trialing Claritin for this or Flonase.  I offered to have her go back to see ENT given the sensation that she occasionally has a turtle neck on despite nothing being on her neck though she deferred this.  I did discuss that I did not feel anything abnormal in her neck or see anything abnormal in her posterior oropharynx though I could not look down her throat or tell her what was going on inside of her neck.  She wanted to monitor and if she develops any other symptoms I would have her see ENT.   No orders of the defined types were placed in this encounter.   No orders of the defined types were placed in this encounter.    Tommi Rumps, MD [place]

## 2017-07-20 NOTE — Patient Instructions (Signed)
Please take adequate vitamin D and calcium supplementation.  Please start weightbearing exercise. Please try Claritin for your ears.  If they do not improve please let us know.

## 2017-10-16 DIAGNOSIS — H2513 Age-related nuclear cataract, bilateral: Secondary | ICD-10-CM | POA: Diagnosis not present

## 2017-10-16 DIAGNOSIS — H353131 Nonexudative age-related macular degeneration, bilateral, early dry stage: Secondary | ICD-10-CM | POA: Diagnosis not present

## 2017-10-16 DIAGNOSIS — H524 Presbyopia: Secondary | ICD-10-CM | POA: Diagnosis not present

## 2018-03-02 DIAGNOSIS — D2261 Melanocytic nevi of right upper limb, including shoulder: Secondary | ICD-10-CM | POA: Diagnosis not present

## 2018-03-02 DIAGNOSIS — L7211 Pilar cyst: Secondary | ICD-10-CM | POA: Diagnosis not present

## 2018-03-02 DIAGNOSIS — D2271 Melanocytic nevi of right lower limb, including hip: Secondary | ICD-10-CM | POA: Diagnosis not present

## 2018-03-02 DIAGNOSIS — L821 Other seborrheic keratosis: Secondary | ICD-10-CM | POA: Diagnosis not present

## 2018-03-02 DIAGNOSIS — D2262 Melanocytic nevi of left upper limb, including shoulder: Secondary | ICD-10-CM | POA: Diagnosis not present

## 2018-03-02 DIAGNOSIS — D225 Melanocytic nevi of trunk: Secondary | ICD-10-CM | POA: Diagnosis not present

## 2018-03-02 DIAGNOSIS — D2272 Melanocytic nevi of left lower limb, including hip: Secondary | ICD-10-CM | POA: Diagnosis not present

## 2018-05-10 ENCOUNTER — Encounter: Payer: Self-pay | Admitting: Family Medicine

## 2018-05-10 ENCOUNTER — Ambulatory Visit (INDEPENDENT_AMBULATORY_CARE_PROVIDER_SITE_OTHER): Payer: Medicare Other | Admitting: Family Medicine

## 2018-05-10 VITALS — BP 130/90 | HR 62 | Temp 97.9°F | Ht 69.0 in | Wt 147.6 lb

## 2018-05-10 DIAGNOSIS — Z130 Encounter for screening for diseases of the blood and blood-forming organs and certain disorders involving the immune mechanism: Secondary | ICD-10-CM

## 2018-05-10 DIAGNOSIS — E785 Hyperlipidemia, unspecified: Secondary | ICD-10-CM | POA: Diagnosis not present

## 2018-05-10 DIAGNOSIS — Z23 Encounter for immunization: Secondary | ICD-10-CM

## 2018-05-10 DIAGNOSIS — L821 Other seborrheic keratosis: Secondary | ICD-10-CM | POA: Insufficient documentation

## 2018-05-10 DIAGNOSIS — Z1329 Encounter for screening for other suspected endocrine disorder: Secondary | ICD-10-CM

## 2018-05-10 DIAGNOSIS — Z1239 Encounter for other screening for malignant neoplasm of breast: Secondary | ICD-10-CM | POA: Diagnosis not present

## 2018-05-10 LAB — COMPREHENSIVE METABOLIC PANEL
ALT: 18 U/L (ref 0–35)
AST: 23 U/L (ref 0–37)
Albumin: 4.3 g/dL (ref 3.5–5.2)
Alkaline Phosphatase: 46 U/L (ref 39–117)
BILIRUBIN TOTAL: 0.8 mg/dL (ref 0.2–1.2)
BUN: 13 mg/dL (ref 6–23)
CO2: 26 meq/L (ref 19–32)
CREATININE: 0.94 mg/dL (ref 0.40–1.20)
Calcium: 9.6 mg/dL (ref 8.4–10.5)
Chloride: 105 mEq/L (ref 96–112)
GFR: 63.29 mL/min (ref >60.00)
GLUCOSE: 107 mg/dL — AB (ref 70–99)
Potassium: 4 mEq/L (ref 3.5–5.1)
SODIUM: 140 meq/L (ref 135–145)
Total Protein: 7.3 g/dL (ref 6.0–8.3)

## 2018-05-10 LAB — LIPID PANEL
CHOL/HDL RATIO: 3
Cholesterol: 224 mg/dL — ABNORMAL HIGH (ref 0–200)
HDL: 73.4 mg/dL (ref >39.00)
LDL Cholesterol: 123 mg/dL — ABNORMAL HIGH (ref 0–99)
NONHDL: 151.03
Triglycerides: 138 mg/dL (ref 0.0–149.0)
VLDL: 27.6 mg/dL (ref 0.0–40.0)

## 2018-05-10 LAB — TSH: TSH: 2.49 u[IU]/mL (ref 0.35–4.50)

## 2018-05-10 MED ORDER — PNEUMOCOCCAL VAC POLYVALENT 25 MCG/0.5ML IJ INJ: 0.5000 mL | INJECTION | INTRAMUSCULAR | 0 refills | Status: AC

## 2018-05-10 NOTE — Assessment & Plan Note (Signed)
Check lipid panel.  Continue diet and exercise. ?

## 2018-05-10 NOTE — Progress Notes (Signed)
Tommi Rumps, MD Phone: [phone]  Angelica Newton is a 67 y.o. female who presents today for f/u.  CC: skin lesion, hld  Skin lesion: Patient notes she has noted a rough skin spot on her mid back.  She saw dermatology in September and notes they did not comment on this.  She notes she is just noticed this.  Hyperlipidemia: She does 2 spin classes weekly.  She walks for exercise.  She eats fairly healthy with salad for lunch grilled chicken for dinner.  Has a cup of fruit a day.  No fried foods.  No sweet tea or soda.  No chest pain or shortness of breath.  No abdominal pain.  Mammogram is due early next year.  Due for breast exam.  Colonoscopy up-to-date.  Patient is postmenopausal with no bleeding.  Due for flu vaccination.  Due for pneumonia vaccination.  No tobacco use or illicit drug use.  Does drink about 5 alcoholic beverages weekly.  Sees a dentist twice yearly.  Sees an ophthalmologist twice yearly.  Social History   Tobacco Use  Smoking Status Former Smoker  . Types: Cigarettes  . Last attempt to quit: 06/09/2010  . Years since quitting: 7.9  Smokeless Tobacco Never Used     ROS see history of present illness  Objective  Physical Exam Vitals:   05/10/18 0905  BP: 130/90  Pulse: 62  Temp: 97.9 F (36.6 C)  SpO2: 98%    BP Readings from Last 3 Encounters:  05/10/18 130/90  07/20/17 138/72  05/08/17 118/64   Wt Readings from Last 3 Encounters:  05/10/18 147 lb 9.6 oz (67 kg)  07/20/17 147 lb (66.7 kg)  05/08/17 148 lb (67.1 kg)    Physical Exam  Constitutional: No distress.  HENT:  Head: Normocephalic and atraumatic.  Mouth/Throat: Oropharynx is clear and moist.  Eyes: Pupils are equal, round, and reactive to light. Conjunctivae are normal.  Neck: Neck supple.  Cardiovascular: Normal rate, regular rhythm and normal heart sounds.  Pulmonary/Chest: Effort normal and breath sounds normal.  Abdominal: Soft. Bowel sounds are normal. She exhibits  no distension. There is no tenderness. There is no rebound and no guarding.  Genitourinary:  Genitourinary Comments: Chaperone used, bilateral breasts with chronic stable nipple inversion, patient reports this is been a chronic issue since she was young and they have not changed, no breast masses, tenderness, or skin changes noted, no axillary masses  Musculoskeletal: She exhibits no edema.  Lymphadenopathy:    She has no cervical adenopathy.  Neurological: She is alert.  Skin: Skin is warm and dry. She is not diaphoretic.        Assessment/Plan: Please see individual problem list.  Seborrheic keratosis Skin lesion is consistent with a seborrheic keratosis.  Patient will monitor.  Hyperlipidemia Check lipid panel.  Continue diet and exercise.   Health Maintenance: Attempted to order mammogram though it appears that there is a warning regarding Medicare not covering this.  We will check with our referral coordinator regarding this.  Check lab work as outlined below.  Flu vaccine given.  Pneumonia vaccine sent to pharmacy.  Orders Placed This Encounter  Procedures  . Flu vaccine HIGH DOSE PF (Fluzone High dose)  . Lipid panel  . Comp Met (CMET)  . TSH    Meds ordered this encounter  Medications  . pneumococcal 23 valent vaccine (PNU-IMMUNE) 25 MCG/0.5ML injection    Sig: Inject 0.5 mLs into the muscle tomorrow at 10 am for 1 dose.  Dispense:  0.5 mL    Refill:  0     Tommi Rumps, MD [place]

## 2018-05-10 NOTE — Assessment & Plan Note (Signed)
Skin lesion is consistent with a seborrheic keratosis.  Patient will monitor.

## 2018-05-10 NOTE — Patient Instructions (Signed)
Nice to see you. We will get lab work today and contact you with the results. 

## 2018-05-11 ENCOUNTER — Encounter: Payer: Self-pay | Admitting: *Deleted

## 2018-05-11 ENCOUNTER — Other Ambulatory Visit: Payer: Self-pay

## 2018-05-11 MED ORDER — EZETIMIBE 10 MG PO TABS: 10.0000 mg | ORAL_TABLET | Freq: Every day | ORAL | 3 refills | Status: DC

## 2018-07-20 ENCOUNTER — Other Ambulatory Visit: Payer: Self-pay | Admitting: Family Medicine

## 2018-07-20 DIAGNOSIS — Z1231 Encounter for screening mammogram for malignant neoplasm of breast: Secondary | ICD-10-CM

## 2018-07-29 ENCOUNTER — Ambulatory Visit
Admission: RE | Admit: 2018-07-29 | Discharge: 2018-07-29 | Disposition: A | Discharge status: Home / Self Care | Payer: Medicare Other | Source: Ambulatory Visit | Attending: Family Medicine | Admitting: Family Medicine

## 2018-07-29 DIAGNOSIS — Z1231 Encounter for screening mammogram for malignant neoplasm of breast: Secondary | ICD-10-CM

## 2018-08-16 DIAGNOSIS — J069 Acute upper respiratory infection, unspecified: Secondary | ICD-10-CM | POA: Diagnosis not present

## 2018-08-16 DIAGNOSIS — H903 Sensorineural hearing loss, bilateral: Secondary | ICD-10-CM | POA: Diagnosis not present

## 2018-12-24 ENCOUNTER — Ambulatory Visit (INDEPENDENT_AMBULATORY_CARE_PROVIDER_SITE_OTHER): Payer: Medicare Other

## 2018-12-24 ENCOUNTER — Other Ambulatory Visit: Payer: Self-pay

## 2018-12-24 DIAGNOSIS — Z0000 Encounter for general adult medical examination without abnormal findings: Secondary | ICD-10-CM | POA: Diagnosis not present

## 2018-12-24 NOTE — Patient Instructions (Addendum)
  Ms. Zaugg , Thank you for taking time to come for your Medicare Wellness Visit. I appreciate your ongoing commitment to your health goals. Please review the following plan we discussed and let me know if I can assist you in the future.   These are the goals we discussed: Goals      Patient Stated   . Increase physical activity (pt-stated)     Use the gym        This is a list of the screening recommended for you and due dates:  Health Maintenance  Topic Date Due  . Pneumonia vaccines (2 of 2 - PPSV23) 05/08/2018  . Flu Shot  01/08/2019  . Mammogram  07/29/2020  . Colon Cancer Screening  09/16/2020  . Tetanus Vaccine  06/10/2023  . DEXA scan (bone density measurement)  Completed  .  Hepatitis C: One time screening is recommended by Center for Disease Control  (CDC) for  adults born from [birth year] through 1964.   Completed

## 2018-12-24 NOTE — Progress Notes (Signed)
Subjective:   Angelica Newton is a 68 y.o. female who presents for an Initial Medicare Annual Wellness Visit.  Review of Systems    No ROS.  Medicare Wellness Virtual Visit.  Visual/audio telehealth visit, UTA vital signs.   See social history for additional risk factors.  Cardiac Risk Factors include: advanced age (>21men, >68 women)     Objective:    Today's Vitals   There is no height or weight on file to calculate BMI.  Advanced Directives 12/24/2018 09/17/2015  Does Patient Have a Medical Advance Directive? Yes No  Type of Paramedic of [place];Living will -  Does patient want to make changes to medical advance directive? No - Patient declined -  Copy of [place] in Chart? No - copy requested -  Would patient like information on creating a medical advance directive? - No - patient declined information    Current Medications (verified) Outpatient Encounter Medications as of 12/24/2018  Medication Sig  . calcium-vitamin D (OSCAL WITH D) 250-125 MG-UNIT per tablet Take 1 tablet by mouth daily.  . cholecalciferol (VITAMIN D) 1000 UNITS tablet Take 1,000 Units by mouth daily. Reported on 08/27/2015  . ezetimibe (ZETIA) 10 MG tablet Take 1 tablet (10 mg total) by mouth daily.  . LUTEIN PO Take 1 tablet by mouth daily. Reported on 08/27/2015   No facility-administered encounter medications on file as of 12/24/2018.     Allergies (verified) Lipitor [atorvastatin], Tetracyclines & related, Zocor [simvastatin], and Mango butter   History: Past Medical History:  Diagnosis Date  . Arthritis    Right hip  . Chicken pox   . Depression   . Motion sickness    in past  . PONV (postoperative nausea and vomiting)   . UTI (urinary tract infection)   . Wears hearing aid    bilateral   Past Surgical History:  Procedure Laterality Date  . AUGMENTATION MAMMAPLASTY Bilateral 2007   mastopexy/breast lift  . BREAST SURGERY  2005   Breast Lift  . COLONOSCOPY WITH PROPOFOL N/A 09/17/2015   Procedure: COLONOSCOPY WITH PROPOFOL;  Surgeon: Lucilla Lame, MD;  Location: [facility];  Service: Endoscopy;  Laterality: N/A;  . ORIF ULNAR FRACTURE  1987   Broken Ulna/plates and screws  . POLYPECTOMY  09/17/2015   Procedure: POLYPECTOMY INTESTINAL;  Surgeon: Lucilla Lame, MD;  Location: [facility];  Service: Endoscopy;;  Sigmoid colon polyp  . TUBAL LIGATION     Family History  Problem Relation Age of Onset  . Hyperlipidemia Mother   . Stroke Mother   . Diabetes Mother   . Hyperlipidemia Father   . Stroke Father   . Hypertension Father   . Kidney failure Father   . Diabetes Father   . Cancer Paternal Grandmother        Breast  . Cancer Brother        squamous cell carcinoma  . Diabetes Maternal Grandmother   . Breast cancer Paternal Aunt    Social History   Socioeconomic History  . Marital status: Single    Spouse name: Not on file  . Number of children: Not on file  . Years of education: Not on file  . Highest education level: Not on file  Occupational History  . Not on file  Social Needs  . Financial resource strain: Not hard at all  . Food insecurity    Worry: Never true    Inability: Never true  . Transportation  needs    Medical: No    Non-medical: No  Tobacco Use  . Smoking status: Former Smoker    Types: Cigarettes    Quit date: 06/09/2010    Years since quitting: 8.5  . Smokeless tobacco: Never Used  Substance and Sexual Activity  . Alcohol use: Yes    Alcohol/week: 10.0 standard drinks    Types: 10 Glasses of wine per week  . Drug use: No  . Sexual activity: Yes    Partners: Male  Lifestyle  . Physical activity    Days per week: 5 days    Minutes per session: 30 min  . Stress: Not at all  Relationships  . Social Herbalist on phone: Not on file    Gets together: Not on file    Attends religious service: Not on file    Active member of club or organization: Not  on file    Attends meetings of clubs or organizations: Not on file    Relationship status: Not on file  Other Topics Concern  . Not on file  Social History Narrative   Retired, but volunteers    Lives by herself   Children- 2 (59 and 23 yo)    Pets: None    Caffeine: 2 cups in the morning, diet coke rarely    Right handed    Enjoys bike riding, cooking, traveling     Tobacco Counseling Counseling given: Not Answered   Clinical Intake:  Pre-visit preparation completed: Yes        Diabetes: No  How often do you need to have someone help you when you read instructions, pamphlets, or other written materials from your doctor or pharmacy?: 1 - Never  Interpreter Needed?: No    Activities of Daily Living In your present state of health, do you have any difficulty performing the following activities: 12/24/2018  Hearing? Y  Comment Hearing aids  Vision? N  Difficulty concentrating or making decisions? N  Walking or climbing stairs? N  Dressing or bathing? N  Doing errands, shopping? N  Preparing Food and eating ? N  Using the Toilet? N  In the past six months, have you accidently leaked urine? N  Do you have problems with loss of bowel control? N  Managing your Medications? N  Managing your Finances? N  Housekeeping or managing your Housekeeping? N  Some recent data might be hidden   Immunizations and Health Maintenance Immunization History  Administered Date(s) Administered  . Influenza, High Dose Seasonal PF 05/10/2018  . Influenza,inj,Quad PF,6+ Mos 05/10/2015  . Pneumococcal Conjugate-13 05/08/2017   Health Maintenance Due  Topic Date Due  . PNA vac Low Risk Adult (2 of 2 - PPSV23) 05/08/2018    Patient Care Team: Leone Haven, MD as PCP - General (Family Medicine)  Indicate any recent Medical Services you may have received from other than [facility] providers in the past year (date may be approximate).     Assessment:   This is a routine wellness  examination for [place].  I connected with patient 12/24/18 at  9:30 AM EDT by a video/audio enabled telemedicine application and verified that I am speaking with the correct person using two identifiers. Patient stated full name and DOB. Patient gave permission to continue with virtual visit. Patient's location was at home and Nurse's location was at [place] office.   Health Screenings  Mammogram - 07/2018 Colonoscopy - 09/2015 Bone Density - 06/2017 Glaucoma -none Hearing - hearing  aids Hemoglobin A1C - 10/2014 (5.6) Cholesterol - 05/2018 Dental- visits every 6 months Vision- visits within the last 12 months.  Social  Alcohol intake - yes     Smoking history- former   Smokers in home? none Illicit drug use? none Exercise - walking daily 30-45 min Diet - lean meats, low carb Sexually Active -not currently BMI- discussed the importance of a healthy diet, water intake and the benefits of aerobic exercise.  Educational material provided.   Safety  Patient feels safe at home- yes Patient does have smoke detectors at home- yes Patient does wear sunscreen or protective clothing when in direct sunlight -yes Patient does wear seat belt when in a moving vehicle -yes Adequate lighting in the home- yes Walkways free from clutter, free throw rugs and extension cords- yes Handrails in use when available- yes  Covid-19 precautions and sickness symptoms discussed.   Activities of Daily Living Patient denies needing assistance with: driving, household chores, feeding themselves, getting from bed to chair, getting to the toilet, bathing/showering, dressing, managing money, or preparing meals.  No new identified risk were noted.    Depression Screen Patient denies losing interest in daily life, feeling hopeless, or crying easily over simple problems.   Medication-taking as directed and without issues.   Fall Screen Patient denies being afraid of falling or falling in the last year.    Memory Screen Patient is alert.  Correctly identified the president of the [place], season and recall. Patient likes complete jigsaw puzzles, word search for brain stimulation.  Immunizations The following Immunizations were discussed: Influenza, shingles, pneumonia, and tetanus.   Other Providers Patient Care Team: Leone Haven, MD as PCP - General (Family Medicine)  Hearing/Vision screen  Hearing Screening   125Hz  250Hz  500Hz  1000Hz  2000Hz  3000Hz  4000Hz  6000Hz  8000Hz   Right ear:           Left ear:           Comments: Hearing aid  Vision Screening Comments: Wears corrective lenses Visual acuity not assessed, virtual visit.  They have seen their ophthalmologist in the last 12 months.    Dietary issues and exercise activities discussed: Current Exercise Habits: Home exercise routine, Type of exercise: walking, Intensity: Mild  Goals      Patient Stated   . Increase physical activity (pt-stated)     Use the gym       Depression Screen PHQ 2/9 Scores 12/24/2018 05/10/2018  PHQ - 2 Score 0 0    Fall Risk Fall Risk  12/24/2018 05/10/2018 05/08/2017  Falls in the past year? 0 0 No  Number falls in past yr: - 0 -  Injury with Fall? - 0 -   Cognitive Function:     6CIT Screen 12/24/2018  What Year? 0 points  What month? 0 points  What time? 0 points  Count back from 20 0 points  Months in reverse 0 points  Repeat phrase 0 points  Total Score 0    Screening Tests Health Maintenance  Topic Date Due  . PNA vac Low Risk Adult (2 of 2 - PPSV23) 05/08/2018  . INFLUENZA VACCINE  01/08/2019  . MAMMOGRAM  07/29/2020  . COLONOSCOPY  09/16/2020  . TETANUS/TDAP  06/10/2023  . DEXA SCAN  Completed  . Hepatitis C Screening  Completed     Plan:    End of life planning; Advance aging; Advanced directives discussed.  Copy of current HCPOA/Living Will requested.    I have personally reviewed and  noted the following in the patient's chart:   . Medical and social history  . Use of alcohol, tobacco or illicit drugs  . Current medications and supplements . Functional ability and status . Nutritional status . Physical activity . Advanced directives . List of other physicians . Hospitalizations, surgeries, and ER visits in previous 12 months . Vitals . Screenings to include cognitive, depression, and falls . Referrals and appointments  In addition, I have reviewed and discussed with patient certain preventive protocols, quality metrics, and best practice recommendations. A written personalized care plan for preventive services as well as general preventive health recommendations were provided to patient.     Varney Biles, LPN   5/64/3329

## 2018-12-26 NOTE — Progress Notes (Signed)
I have reviewed the above note and agree.  Ausencio Vaden, M.D.  

## 2019-01-07 DIAGNOSIS — M25551 Pain in right hip: Secondary | ICD-10-CM | POA: Diagnosis not present

## 2019-01-07 DIAGNOSIS — G8929 Other chronic pain: Secondary | ICD-10-CM | POA: Diagnosis not present

## 2019-01-07 DIAGNOSIS — M1611 Unilateral primary osteoarthritis, right hip: Secondary | ICD-10-CM | POA: Diagnosis not present

## 2019-01-09 DIAGNOSIS — M1611 Unilateral primary osteoarthritis, right hip: Secondary | ICD-10-CM | POA: Insufficient documentation

## 2019-01-11 DIAGNOSIS — H2513 Age-related nuclear cataract, bilateral: Secondary | ICD-10-CM | POA: Diagnosis not present

## 2019-01-11 DIAGNOSIS — H524 Presbyopia: Secondary | ICD-10-CM | POA: Diagnosis not present

## 2019-01-11 DIAGNOSIS — H353131 Nonexudative age-related macular degeneration, bilateral, early dry stage: Secondary | ICD-10-CM | POA: Diagnosis not present

## 2019-02-26 NOTE — Discharge Instructions (Signed)
Instructions after Total Hip Replacement ° ° °  Fabricio Endsley P. Duaine Radin, Jr., M.D.    ° Dept. of Orthopaedics & Sports Medicine ° [facility] ° [address] ° [place], [place]  [address] ° Phone: [phone]   Fax: [phone] ° °  °DIET: °• Drink plenty of non-alcoholic fluids. °• Resume your normal diet. Include foods high in fiber. ° °ACTIVITY:  °• You may use crutches or a walker with weight-bearing as tolerated, unless instructed otherwise. °• You may be weaned off of the walker or crutches by your Physical Therapist.  °• Do NOT reach below the level of your knees or cross your legs until allowed.    °• Continue doing gentle exercises. Exercising will reduce the pain and swelling, increase motion, and prevent muscle weakness.   °• Please continue to use the TED compression stockings for 6 weeks. You may remove the stockings at night, but should reapply them in the morning. °• Do not drive or operate any equipment until instructed. ° °WOUND CARE:  °• Continue to use ice packs periodically to reduce pain and swelling. °• Keep the incision clean and dry. °• You may bathe or shower after the staples are removed at the first office visit following surgery. ° °MEDICATIONS: °• You may resume your regular medications. °• Please take the pain medication as prescribed on the medication. °• Do not take pain medication on an empty stomach. °• You have been given a prescription for a blood thinner to prevent blood clots. Please take the medication as instructed. (NOTE: After completing a 2 week course of Lovenox, take one Enteric-coated aspirin once a day.) °• Pain medications and iron supplements can cause constipation. Use a stool softener (Senokot or Colace) on a daily basis and a laxative (dulcolax or miralax) as needed. °• Do not drive or drink alcoholic beverages when taking pain medications. ° °CALL THE OFFICE FOR: °• Temperature above 101 degrees °• Excessive bleeding or drainage on the dressing. °• Excessive  swelling, coldness, or paleness of the toes. °• Persistent nausea and vomiting. ° °FOLLOW-UP:  °• You should have an appointment to return to the office in 6 weeks after surgery. °• Arrangements have been made for continuation of Physical Therapy (either home therapy or outpatient therapy). °  °

## 2019-03-01 ENCOUNTER — Encounter
Admission: RE | Admit: 2019-03-01 | Discharge: 2019-03-01 | Disposition: A | Discharge status: Home / Self Care | Payer: Medicare Other | Source: Ambulatory Visit | Attending: Orthopedic Surgery | Admitting: Orthopedic Surgery

## 2019-03-01 ENCOUNTER — Other Ambulatory Visit: Payer: Self-pay

## 2019-03-01 DIAGNOSIS — Z01810 Encounter for preprocedural cardiovascular examination: Secondary | ICD-10-CM | POA: Insufficient documentation

## 2019-03-01 DIAGNOSIS — Z01812 Encounter for preprocedural laboratory examination: Secondary | ICD-10-CM | POA: Diagnosis not present

## 2019-03-01 DIAGNOSIS — I447 Left bundle-branch block, unspecified: Secondary | ICD-10-CM | POA: Diagnosis not present

## 2019-03-01 LAB — COMPREHENSIVE METABOLIC PANEL
ALT: 20 U/L (ref 0–44)
AST: 25 U/L (ref 15–41)
Albumin: 4.4 g/dL (ref 3.5–5.0)
Alkaline Phosphatase: 43 U/L (ref 38–126)
Anion gap: 10 (ref 5–15)
BUN: 16 mg/dL (ref 8–23)
CO2: 24 mmol/L (ref 22–32)
Calcium: 9.3 mg/dL (ref 8.9–10.3)
Chloride: 105 mmol/L (ref 98–111)
Creatinine, Ser: 0.83 mg/dL (ref 0.44–1.00)
GFR calc Af Amer: >60 mL/min (ref >60)
GFR calc non Af Amer: >60 mL/min (ref >60)
Glucose, Bld: 98 mg/dL (ref 70–99)
Potassium: 3.8 mmol/L (ref 3.5–5.1)
Sodium: 139 mmol/L (ref 135–145)
Total Bilirubin: 0.7 mg/dL (ref 0.3–1.2)
Total Protein: 7.4 g/dL (ref 6.5–8.1)

## 2019-03-01 LAB — URINALYSIS, ROUTINE W REFLEX MICROSCOPIC
Bilirubin Urine: NEGATIVE
Glucose, UA: NEGATIVE mg/dL
Hgb urine dipstick: NEGATIVE
Ketones, ur: NEGATIVE mg/dL
Nitrite: POSITIVE — AB
Protein, ur: NEGATIVE mg/dL
Specific Gravity, Urine: 1.009 (ref 1.005–1.030)
pH: 6 (ref 5.0–8.0)

## 2019-03-01 LAB — CBC
HCT: 38.6 % (ref 36.0–46.0)
Hemoglobin: 12.6 g/dL (ref 12.0–15.0)
MCH: 30.6 pg (ref 26.0–34.0)
MCHC: 32.6 g/dL (ref 30.0–36.0)
MCV: 93.7 fL (ref 80.0–100.0)
Platelets: 255 10*3/uL (ref 150–400)
RBC: 4.12 MIL/uL (ref 3.87–5.11)
RDW: 13.3 % (ref 11.5–15.5)
WBC: 6.6 10*3/uL (ref 4.0–10.5)
nRBC: 0 % (ref 0.0–0.2)

## 2019-03-01 LAB — PROTIME-INR
INR: 0.9 (ref 0.8–1.2)
Prothrombin Time: 12 seconds (ref 11.4–15.2)

## 2019-03-01 LAB — TYPE AND SCREEN
ABO/RH(D): A POS
Antibody Screen: NEGATIVE

## 2019-03-01 LAB — SURGICAL PCR SCREEN
MRSA, PCR: NEGATIVE
Staphylococcus aureus: NEGATIVE

## 2019-03-01 LAB — C-REACTIVE PROTEIN: CRP: <0.8 mg/dL (ref <1.0)

## 2019-03-01 LAB — SEDIMENTATION RATE: Sed Rate: 15 mm/hr (ref 0–30)

## 2019-03-01 LAB — APTT: aPTT: 30 seconds (ref 24–36)

## 2019-03-01 NOTE — Pre-Procedure Instructions (Signed)
UA results sent to Dr. Hooten for review. 

## 2019-03-01 NOTE — Patient Instructions (Addendum)
Your procedure is scheduled on: 03-09-19 [facility] Report to Same Day Surgery 2nd floor medical mall [facility] Entrance-take elevator on left to 2nd floor.  Check in with surgery information desk.) To find out your arrival time please call [phone] between 1PM - 3PM on 03-08-19 TUESDAY  Remember: Instructions that are not followed completely may result in serious medical risk, up to and including death, or upon the discretion of your surgeon and anesthesiologist your surgery may need to be rescheduled.    _x___ 1. Do not eat food after midnight the night before your procedure. NO GUM OR CANDY AFTER MIDNIGHT. You may drink clear liquids up to 2 hours before you are scheduled to arrive at the hospital for your procedure.  Do not drink clear liquids within 2 hours of your scheduled arrival to the hospital.  Clear liquids include  --Water or Apple juice without pulp  --Gatorade  --Black Coffee or Clear Tea (No milk, no creamers, do not add anything to the coffee or Tea   ____Ensure clear carbohydrate drink on the way to the hospital for bariatric patients  _X___Ensure clear carbohydrate drink 3 hours before surgery.     __x__ 2. No Alcohol for 24 hours before or after surgery.   __x__3. No Smoking or e-cigarettes for 24 prior to surgery.  Do not use any chewable tobacco products for at least 6 hour prior to surgery   ____  4. Bring all medications with you on the day of surgery if instructed.    __x__ 5. Notify your doctor if there is any change in your medical condition     (cold, fever, infections).    x___6. On the morning of surgery brush your teeth with toothpaste and water.  You may rinse your mouth with mouth wash if you wish.  Do not swallow any toothpaste or mouthwash.   Do not wear jewelry, make-up, hairpins, clips or nail polish.  Do not wear lotions, powders, or perfumes.   Do not shave 48 hours prior to surgery. Men may shave face and neck.  Do not bring valuables  to the hospital.    [facility] is not responsible for any belongings or valuables.               Contacts, dentures or bridgework may not be worn into surgery.  Leave your suitcase in the car. After surgery it may be brought to your room.  For patients admitted to the hospital, discharge time is determined by your treatment team.  _  Patients discharged the day of surgery will not be allowed to drive home.  You will need someone to drive you home and stay with you the night of your procedure.    Please read over the following fact sheets that you were given:   [facility] Preparing for Surgery and or MRSA Information   _x___ TAKE THE FOLLOWING MEDICATION THE MORNING OF SURGERY WITH A SMALL SIP OF WATER. These include:  1. ZETIA  2.  3.  4.  5.  6.  ____Fleets enema or Magnesium Citrate as directed.   _x___ Use CHG Soap or sage wipes as directed on instruction sheet   ____ Use inhalers on the day of surgery and bring to hospital day of surgery  ____ Stop Metformin and Janumet 2 days prior to surgery.    ____ Take 1/2 of usual insulin dose the night before surgery and none on the morning surgery.   _x___ Follow recommendations from Cardiologist,  Pulmonologist or PCP regarding stopping Aspirin, Coumadin, Plavix ,Eliquis, Effient, or Pradaxa, and Pletal-STOP ASPRIN NOW  X____Stop Anti-inflammatories such as Advil, Aleve, Ibuprofen, Motrin, Naproxen, Naprosyn, Goodies powders or aspirin products NOW-OK to take Tylenol    _x___ Stop supplements until after surgery-STOP LUTEIN NOW-MAY RESUME AFTER SURGERY   ____ Bring C-Pap to the hospital.

## 2019-03-01 NOTE — Pre-Procedure Instructions (Signed)
EKG DONE TODAY THAT SHOWS [facility]BBB-NO OTHER EKGS IN Epic OR CARE EVERYWHERE FOR COMPARISON-DR KEPHART WANTS CARDIAC CLEARANCE-TIFFANY AT DR HOOTENS NOTIFIED OF THIS. I WILL SEND CLEARANCE TO HER AND SHE WILL GET PT SET UP WITH CARDIOLOGIST

## 2019-03-02 DIAGNOSIS — I447 Left bundle-branch block, unspecified: Secondary | ICD-10-CM | POA: Diagnosis not present

## 2019-03-02 DIAGNOSIS — E7800 Pure hypercholesterolemia, unspecified: Secondary | ICD-10-CM | POA: Diagnosis not present

## 2019-03-02 DIAGNOSIS — Z01810 Encounter for preprocedural cardiovascular examination: Secondary | ICD-10-CM | POA: Diagnosis not present

## 2019-03-02 DIAGNOSIS — R079 Chest pain, unspecified: Secondary | ICD-10-CM | POA: Diagnosis not present

## 2019-03-03 DIAGNOSIS — D2261 Melanocytic nevi of right upper limb, including shoulder: Secondary | ICD-10-CM | POA: Diagnosis not present

## 2019-03-03 DIAGNOSIS — D225 Melanocytic nevi of trunk: Secondary | ICD-10-CM | POA: Diagnosis not present

## 2019-03-03 DIAGNOSIS — D2262 Melanocytic nevi of left upper limb, including shoulder: Secondary | ICD-10-CM | POA: Diagnosis not present

## 2019-03-03 DIAGNOSIS — I447 Left bundle-branch block, unspecified: Secondary | ICD-10-CM | POA: Diagnosis not present

## 2019-03-03 DIAGNOSIS — L821 Other seborrheic keratosis: Secondary | ICD-10-CM | POA: Diagnosis not present

## 2019-03-03 DIAGNOSIS — D2271 Melanocytic nevi of right lower limb, including hip: Secondary | ICD-10-CM | POA: Diagnosis not present

## 2019-03-03 DIAGNOSIS — D2272 Melanocytic nevi of left lower limb, including hip: Secondary | ICD-10-CM | POA: Diagnosis not present

## 2019-03-03 DIAGNOSIS — Z01810 Encounter for preprocedural cardiovascular examination: Secondary | ICD-10-CM | POA: Diagnosis not present

## 2019-03-03 LAB — URINE CULTURE
Culture: >=100000 — AB
Special Requests: NORMAL

## 2019-03-03 NOTE — Pre-Procedure Instructions (Signed)
Called Dr Clydell Hakim office regarding cardiac clearance.  Patient had appointment 01/30/2019 with cardiology and a stress test today.

## 2019-03-04 ENCOUNTER — Other Ambulatory Visit
Admission: RE | Admit: 2019-03-04 | Discharge: 2019-03-04 | Disposition: A | Discharge status: Home / Self Care | Payer: Medicare Other | Source: Ambulatory Visit | Attending: Orthopedic Surgery | Admitting: Orthopedic Surgery

## 2019-03-04 ENCOUNTER — Other Ambulatory Visit: Payer: Self-pay

## 2019-03-04 DIAGNOSIS — Z20828 Contact with and (suspected) exposure to other viral communicable diseases: Secondary | ICD-10-CM | POA: Diagnosis not present

## 2019-03-04 DIAGNOSIS — Z01812 Encounter for preprocedural laboratory examination: Secondary | ICD-10-CM | POA: Diagnosis not present

## 2019-03-04 LAB — SARS CORONAVIRUS 2 (TAT 6-24 HRS): SARS Coronavirus 2: NEGATIVE

## 2019-03-08 MED ORDER — TRANEXAMIC ACID-NACL 1000-0.7 MG/100ML-% IV SOLN
1000.0000 mg | INTRAVENOUS | Status: AC
  Administered 2019-03-09: 08:00:00 1000 mg via INTRAVENOUS
  Filled 2019-03-08: qty 100

## 2019-03-09 ENCOUNTER — Encounter
Admission: RE | Disposition: A | Discharge status: Home Health | Payer: Self-pay | Source: Home / Self Care | Attending: Orthopedic Surgery

## 2019-03-09 ENCOUNTER — Other Ambulatory Visit: Payer: Self-pay

## 2019-03-09 ENCOUNTER — Inpatient Hospital Stay: Payer: Medicare Other

## 2019-03-09 ENCOUNTER — Inpatient Hospital Stay: Payer: Medicare Other | Admitting: Anesthesiology

## 2019-03-09 ENCOUNTER — Inpatient Hospital Stay
Admission: RE | Admit: 2019-03-09 | Discharge: 2019-03-11 | DRG: 470 | Disposition: A | Discharge status: Home Health | Payer: Medicare Other | Attending: Orthopedic Surgery | Admitting: Orthopedic Surgery

## 2019-03-09 ENCOUNTER — Encounter: Payer: Self-pay | Admitting: Orthopedic Surgery

## 2019-03-09 DIAGNOSIS — Z841 Family history of disorders of kidney and ureter: Secondary | ICD-10-CM | POA: Diagnosis not present

## 2019-03-09 DIAGNOSIS — Z823 Family history of stroke: Secondary | ICD-10-CM

## 2019-03-09 DIAGNOSIS — Z833 Family history of diabetes mellitus: Secondary | ICD-10-CM | POA: Diagnosis not present

## 2019-03-09 DIAGNOSIS — M810 Age-related osteoporosis without current pathological fracture: Secondary | ICD-10-CM | POA: Diagnosis present

## 2019-03-09 DIAGNOSIS — Z96649 Presence of unspecified artificial hip joint: Secondary | ICD-10-CM

## 2019-03-09 DIAGNOSIS — Z8249 Family history of ischemic heart disease and other diseases of the circulatory system: Secondary | ICD-10-CM

## 2019-03-09 DIAGNOSIS — Z471 Aftercare following joint replacement surgery: Secondary | ICD-10-CM | POA: Diagnosis not present

## 2019-03-09 DIAGNOSIS — E785 Hyperlipidemia, unspecified: Secondary | ICD-10-CM | POA: Diagnosis present

## 2019-03-09 DIAGNOSIS — M1611 Unilateral primary osteoarthritis, right hip: Principal | ICD-10-CM | POA: Diagnosis present

## 2019-03-09 DIAGNOSIS — Z96641 Presence of right artificial hip joint: Secondary | ICD-10-CM | POA: Diagnosis not present

## 2019-03-09 HISTORY — PX: TOTAL HIP ARTHROPLASTY: SHX124

## 2019-03-09 LAB — ABO/RH: ABO/RH(D): A POS

## 2019-03-09 SURGERY — ARTHROPLASTY, HIP, TOTAL,POSTERIOR APPROACH
Anesthesia: Spinal | Site: Hip | Laterality: Right

## 2019-03-09 MED ORDER — ACETAMINOPHEN 325 MG PO TABS
325.0000 mg | ORAL_TABLET | Freq: Four times a day (QID) | ORAL | Status: DC | PRN
  Administered 2019-03-10: 650 mg via ORAL
  Filled 2019-03-09: qty 2

## 2019-03-09 MED ORDER — DEXAMETHASONE SODIUM PHOSPHATE 10 MG/ML IJ SOLN
8.0000 mg | Freq: Once | INTRAMUSCULAR | Status: AC
  Administered 2019-03-09: 07:00:00 8 mg via INTRAVENOUS

## 2019-03-09 MED ORDER — MENTHOL 3 MG MT LOZG
1.0000 | LOZENGE | OROMUCOSAL | Status: DC | PRN
  Filled 2019-03-09: qty 9

## 2019-03-09 MED ORDER — OXYCODONE HCL 5 MG/5ML PO SOLN: 5.0000 mg | Freq: Once | ORAL | Status: DC | PRN

## 2019-03-09 MED ORDER — METOCLOPRAMIDE HCL 10 MG PO TABS
10.0000 mg | ORAL_TABLET | Freq: Three times a day (TID) | ORAL | Status: DC
  Administered 2019-03-09 – 2019-03-11 (×7): 10 mg via ORAL
  Filled 2019-03-09 (×7): qty 1

## 2019-03-09 MED ORDER — SODIUM CHLORIDE 0.9 % IV SOLN
INTRAVENOUS | Status: DC
  Administered 2019-03-09 – 2019-03-11 (×3): via INTRAVENOUS

## 2019-03-09 MED ORDER — FLEET ENEMA 7-19 GM/118ML RE ENEM: 1.0000 | ENEMA | Freq: Once | RECTAL | Status: DC | PRN

## 2019-03-09 MED ORDER — OXYCODONE HCL 5 MG PO TABS
10.0000 mg | ORAL_TABLET | ORAL | Status: DC | PRN
  Administered 2019-03-10: 10 mg via ORAL
  Filled 2019-03-09: qty 2

## 2019-03-09 MED ORDER — CELECOXIB 200 MG PO CAPS
ORAL_CAPSULE | ORAL | Status: AC
  Filled 2019-03-09: qty 2

## 2019-03-09 MED ORDER — ACETAMINOPHEN 10 MG/ML IV SOLN
1000.0000 mg | Freq: Four times a day (QID) | INTRAVENOUS | Status: AC
  Administered 2019-03-09 – 2019-03-10 (×4): 1000 mg via INTRAVENOUS
  Filled 2019-03-09 (×5): qty 100

## 2019-03-09 MED ORDER — MAGNESIUM HYDROXIDE 400 MG/5ML PO SUSP
30.0000 mL | Freq: Every day | ORAL | Status: DC
  Administered 2019-03-10 – 2019-03-11 (×2): 30 mL via ORAL
  Filled 2019-03-09 (×2): qty 30

## 2019-03-09 MED ORDER — TRAMADOL HCL 50 MG PO TABS: 50.0000 mg | ORAL_TABLET | ORAL | Status: DC | PRN

## 2019-03-09 MED ORDER — METOCLOPRAMIDE HCL 10 MG PO TABS: 5.0000 mg | ORAL_TABLET | Freq: Three times a day (TID) | ORAL | Status: DC | PRN

## 2019-03-09 MED ORDER — BISACODYL 10 MG RE SUPP: 10.0000 mg | Freq: Every day | RECTAL | Status: DC | PRN

## 2019-03-09 MED ORDER — INFLUENZA VAC A&B SA ADJ QUAD 0.5 ML IM PRSY
0.5000 mL | PREFILLED_SYRINGE | INTRAMUSCULAR | Status: DC
  Filled 2019-03-09: qty 0.5

## 2019-03-09 MED ORDER — MIDAZOLAM HCL 5 MG/5ML IJ SOLN
INTRAMUSCULAR | Status: DC | PRN
  Administered 2019-03-09: 2 mg via INTRAVENOUS

## 2019-03-09 MED ORDER — GABAPENTIN 300 MG PO CAPS
300.0000 mg | ORAL_CAPSULE | Freq: Every day | ORAL | Status: DC
  Administered 2019-03-09 – 2019-03-10 (×2): 300 mg via ORAL
  Filled 2019-03-09 (×2): qty 1

## 2019-03-09 MED ORDER — GLYCOPYRROLATE 0.2 MG/ML IJ SOLN
INTRAMUSCULAR | Status: DC | PRN
  Administered 2019-03-09: 0.2 mg via INTRAVENOUS

## 2019-03-09 MED ORDER — LACTATED RINGERS IV SOLN
INTRAVENOUS | Status: DC
  Administered 2019-03-09 (×2): via INTRAVENOUS

## 2019-03-09 MED ORDER — FENTANYL CITRATE (PF) 100 MCG/2ML IJ SOLN
INTRAMUSCULAR | Status: DC | PRN
  Administered 2019-03-09: 25 ug via INTRAVENOUS
  Administered 2019-03-09: 50 ug via INTRAVENOUS
  Administered 2019-03-09: 25 ug via INTRAVENOUS

## 2019-03-09 MED ORDER — ENSURE PRE-SURGERY PO LIQD
296.0000 mL | Freq: Once | ORAL | Status: DC
  Filled 2019-03-09: qty 296

## 2019-03-09 MED ORDER — OXYCODONE HCL 5 MG PO TABS
5.0000 mg | ORAL_TABLET | ORAL | Status: DC | PRN
  Administered 2019-03-09: 5 mg via ORAL
  Filled 2019-03-09 (×2): qty 1

## 2019-03-09 MED ORDER — FERROUS SULFATE 325 (65 FE) MG PO TABS
325.0000 mg | ORAL_TABLET | Freq: Two times a day (BID) | ORAL | Status: DC
  Administered 2019-03-09 – 2019-03-11 (×4): 325 mg via ORAL
  Filled 2019-03-09 (×4): qty 1

## 2019-03-09 MED ORDER — EZETIMIBE 10 MG PO TABS
10.0000 mg | ORAL_TABLET | Freq: Every day | ORAL | Status: DC
  Administered 2019-03-10 – 2019-03-11 (×2): 10 mg via ORAL
  Filled 2019-03-09 (×2): qty 1

## 2019-03-09 MED ORDER — GABAPENTIN 300 MG PO CAPS
ORAL_CAPSULE | ORAL | Status: AC
  Filled 2019-03-09: qty 1

## 2019-03-09 MED ORDER — SENNOSIDES-DOCUSATE SODIUM 8.6-50 MG PO TABS
1.0000 | ORAL_TABLET | Freq: Two times a day (BID) | ORAL | Status: DC
  Administered 2019-03-09 – 2019-03-11 (×4): 1 via ORAL
  Filled 2019-03-09 (×4): qty 1

## 2019-03-09 MED ORDER — FENTANYL CITRATE (PF) 100 MCG/2ML IJ SOLN
INTRAMUSCULAR | Status: AC
  Filled 2019-03-09: qty 2

## 2019-03-09 MED ORDER — BUPIVACAINE LIPOSOME 1.3 % IJ SUSP
INTRAMUSCULAR | Status: AC
  Filled 2019-03-09: qty 20

## 2019-03-09 MED ORDER — CEFAZOLIN SODIUM-DEXTROSE 2-4 GM/100ML-% IV SOLN
2.0000 g | Freq: Four times a day (QID) | INTRAVENOUS | Status: AC
  Administered 2019-03-09 – 2019-03-10 (×4): 2 g via INTRAVENOUS
  Filled 2019-03-09 (×4): qty 100

## 2019-03-09 MED ORDER — ONDANSETRON HCL 4 MG PO TABS: 4.0000 mg | ORAL_TABLET | Freq: Four times a day (QID) | ORAL | Status: DC | PRN

## 2019-03-09 MED ORDER — DIPHENHYDRAMINE HCL 12.5 MG/5ML PO ELIX: 12.5000 mg | ORAL_SOLUTION | ORAL | Status: DC | PRN

## 2019-03-09 MED ORDER — PROPOFOL 10 MG/ML IV BOLUS
INTRAVENOUS | Status: DC | PRN
  Administered 2019-03-09: 40 mg via INTRAVENOUS
  Administered 2019-03-09: 30 mg via INTRAVENOUS

## 2019-03-09 MED ORDER — PROPOFOL 500 MG/50ML IV EMUL
INTRAVENOUS | Status: AC
  Filled 2019-03-09: qty 50

## 2019-03-09 MED ORDER — TRANEXAMIC ACID-NACL 1000-0.7 MG/100ML-% IV SOLN
1000.0000 mg | Freq: Once | INTRAVENOUS | Status: AC
  Administered 2019-03-09: 1000 mg via INTRAVENOUS
  Filled 2019-03-09: qty 100

## 2019-03-09 MED ORDER — OXYCODONE HCL 5 MG PO TABS: 5.0000 mg | ORAL_TABLET | Freq: Once | ORAL | Status: DC | PRN

## 2019-03-09 MED ORDER — FAMOTIDINE 20 MG PO TABS
ORAL_TABLET | ORAL | Status: AC
  Filled 2019-03-09: qty 1

## 2019-03-09 MED ORDER — SODIUM CHLORIDE 0.9 % IV SOLN
INTRAVENOUS | Status: DC | PRN
  Administered 2019-03-09: 10 ug/min via INTRAVENOUS

## 2019-03-09 MED ORDER — CEFAZOLIN SODIUM-DEXTROSE 2-4 GM/100ML-% IV SOLN
2.0000 g | INTRAVENOUS | Status: AC
  Administered 2019-03-09: 07:00:00 2 g via INTRAVENOUS

## 2019-03-09 MED ORDER — CHLORHEXIDINE GLUCONATE 4 % EX LIQD: 60.0000 mL | Freq: Once | CUTANEOUS | Status: DC

## 2019-03-09 MED ORDER — BUPIVACAINE HCL (PF) 0.25 % IJ SOLN
INTRAMUSCULAR | Status: AC
  Filled 2019-03-09: qty 30

## 2019-03-09 MED ORDER — ONDANSETRON HCL 4 MG/2ML IJ SOLN: 4.0000 mg | Freq: Four times a day (QID) | INTRAMUSCULAR | Status: DC | PRN

## 2019-03-09 MED ORDER — VITAMIN D 25 MCG (1000 UNIT) PO TABS
2000.0000 [IU] | ORAL_TABLET | Freq: Every evening | ORAL | Status: DC
  Filled 2019-03-09 (×3): qty 2

## 2019-03-09 MED ORDER — PROPOFOL 500 MG/50ML IV EMUL
INTRAVENOUS | Status: DC | PRN
  Administered 2019-03-09: 80 ug/kg/min via INTRAVENOUS

## 2019-03-09 MED ORDER — PANTOPRAZOLE SODIUM 40 MG PO TBEC
40.0000 mg | DELAYED_RELEASE_TABLET | Freq: Two times a day (BID) | ORAL | Status: DC
  Administered 2019-03-09 – 2019-03-11 (×4): 40 mg via ORAL
  Filled 2019-03-09 (×4): qty 1

## 2019-03-09 MED ORDER — GABAPENTIN 300 MG PO CAPS
300.0000 mg | ORAL_CAPSULE | Freq: Once | ORAL | Status: AC
  Administered 2019-03-09: 06:00:00 300 mg via ORAL

## 2019-03-09 MED ORDER — HYDROMORPHONE HCL 1 MG/ML IJ SOLN: 0.5000 mg | INTRAMUSCULAR | Status: DC | PRN

## 2019-03-09 MED ORDER — ONDANSETRON HCL 4 MG/2ML IJ SOLN
INTRAMUSCULAR | Status: DC | PRN
  Administered 2019-03-09: 4 mg via INTRAVENOUS

## 2019-03-09 MED ORDER — DEXAMETHASONE SODIUM PHOSPHATE 10 MG/ML IJ SOLN
INTRAMUSCULAR | Status: AC
  Filled 2019-03-09: qty 1

## 2019-03-09 MED ORDER — CEFAZOLIN SODIUM-DEXTROSE 2-4 GM/100ML-% IV SOLN
INTRAVENOUS | Status: AC
  Filled 2019-03-09: qty 100

## 2019-03-09 MED ORDER — BUPIVACAINE HCL (PF) 0.5 % IJ SOLN
INTRAMUSCULAR | Status: DC | PRN
  Administered 2019-03-09: 3 mL

## 2019-03-09 MED ORDER — ADULT MULTIVITAMIN W/MINERALS CH
1.0000 | ORAL_TABLET | Freq: Every evening | ORAL | Status: DC
  Filled 2019-03-09 (×3): qty 1

## 2019-03-09 MED ORDER — ACETAMINOPHEN 10 MG/ML IV SOLN
INTRAVENOUS | Status: DC | PRN
  Administered 2019-03-09: 1000 mg via INTRAVENOUS

## 2019-03-09 MED ORDER — ENOXAPARIN SODIUM 30 MG/0.3ML ~~LOC~~ SOLN
30.0000 mg | Freq: Two times a day (BID) | SUBCUTANEOUS | Status: DC
  Administered 2019-03-10 – 2019-03-11 (×3): 30 mg via SUBCUTANEOUS
  Filled 2019-03-09 (×3): qty 0.3

## 2019-03-09 MED ORDER — ALUM & MAG HYDROXIDE-SIMETH 200-200-20 MG/5ML PO SUSP: 30.0000 mL | ORAL | Status: DC | PRN

## 2019-03-09 MED ORDER — FAMOTIDINE 20 MG PO TABS
20.0000 mg | ORAL_TABLET | Freq: Once | ORAL | Status: AC
  Administered 2019-03-09: 06:00:00 20 mg via ORAL

## 2019-03-09 MED ORDER — METOCLOPRAMIDE HCL 5 MG/ML IJ SOLN: 5.0000 mg | Freq: Three times a day (TID) | INTRAMUSCULAR | Status: DC | PRN

## 2019-03-09 MED ORDER — PHENOL 1.4 % MT LIQD
1.0000 | OROMUCOSAL | Status: DC | PRN
  Filled 2019-03-09: qty 177

## 2019-03-09 MED ORDER — CELECOXIB 200 MG PO CAPS
400.0000 mg | ORAL_CAPSULE | Freq: Once | ORAL | Status: AC
  Administered 2019-03-09: 06:00:00 400 mg via ORAL

## 2019-03-09 MED ORDER — MIDAZOLAM HCL 2 MG/2ML IJ SOLN
INTRAMUSCULAR | Status: AC
  Filled 2019-03-09: qty 2

## 2019-03-09 MED ORDER — NEOMYCIN-POLYMYXIN B GU 40-200000 IR SOLN
Status: DC | PRN
  Administered 2019-03-09: 14 mL

## 2019-03-09 MED ORDER — SODIUM CHLORIDE FLUSH 0.9 % IV SOLN
INTRAVENOUS | Status: AC
  Filled 2019-03-09: qty 40

## 2019-03-09 MED ORDER — CELECOXIB 200 MG PO CAPS
200.0000 mg | ORAL_CAPSULE | Freq: Two times a day (BID) | ORAL | Status: DC
  Administered 2019-03-09 – 2019-03-11 (×4): 200 mg via ORAL
  Filled 2019-03-09 (×5): qty 1

## 2019-03-09 MED ORDER — FENTANYL CITRATE (PF) 100 MCG/2ML IJ SOLN: 25.0000 ug | INTRAMUSCULAR | Status: DC | PRN

## 2019-03-09 SURGICAL SUPPLY — 63 items
AML 19.5 SML 150 LEN 43 ×2 IMPLANT
AML 19.5 SML 150 LEN 43MM ×1 IMPLANT
BLADE DRUM FLTD (BLADE) ×3 IMPLANT
BLADE SAW 90X25X1.19 OSCILLAT (BLADE) ×3 IMPLANT
CANISTER SUCT 1200ML W/VALVE (MISCELLANEOUS) ×3 IMPLANT
CANISTER SUCT 3000ML PPV (MISCELLANEOUS) ×6 IMPLANT
CARTRIDGE OIL MAESTRO DRILL (MISCELLANEOUS) ×1 IMPLANT
COVER WAND RF STERILE (DRAPES) ×3 IMPLANT
CUP ACETBLR 54 OD 100 SERIES (Hips) ×3 IMPLANT
DIFFUSER DRILL AIR PNEUMATIC (MISCELLANEOUS) ×3 IMPLANT
DRAPE 3/4 80X56 (DRAPES) ×3 IMPLANT
DRAPE INCISE IOBAN 66X60 STRL (DRAPES) ×3 IMPLANT
DRSG DERMACEA 8X12 NADH (GAUZE/BANDAGES/DRESSINGS) ×3 IMPLANT
DRSG OPSITE POSTOP 4X12 (GAUZE/BANDAGES/DRESSINGS) ×3 IMPLANT
DRSG OPSITE POSTOP 4X14 (GAUZE/BANDAGES/DRESSINGS) IMPLANT
DRSG TEGADERM 4X4.75 (GAUZE/BANDAGES/DRESSINGS) ×3 IMPLANT
DURAPREP 26ML APPLICATOR (WOUND CARE) ×3 IMPLANT
ELECT REM PT RETURN 9FT ADLT (ELECTROSURGICAL) ×3
ELECTRODE REM PT RTRN 9FT ADLT (ELECTROSURGICAL) ×1 IMPLANT
GLOVE BIO SURGEON STRL SZ7.5 (GLOVE) ×6 IMPLANT
GLOVE BIOGEL M STRL SZ7.5 (GLOVE) ×6 IMPLANT
GLOVE BIOGEL PI IND STRL 7.5 (GLOVE) ×1 IMPLANT
GLOVE BIOGEL PI INDICATOR 7.5 (GLOVE) ×2
GLOVE INDICATOR 8.0 STRL GRN (GLOVE) ×3 IMPLANT
GOWN STRL REUS W/ TWL LRG LVL3 (GOWN DISPOSABLE) ×2 IMPLANT
GOWN STRL REUS W/ TWL XL LVL3 (GOWN DISPOSABLE) ×1 IMPLANT
GOWN STRL REUS W/TWL LRG LVL3 (GOWN DISPOSABLE) ×4
GOWN STRL REUS W/TWL XL LVL3 (GOWN DISPOSABLE) ×2
HEAD M SROM 36MM PLUS 1.5 (Hips) ×1 IMPLANT
HEMOVAC 400CC 10FR (MISCELLANEOUS) ×3 IMPLANT
HIP AML 19.5 SML 150 LEN 43 ×1 IMPLANT
HOLDER FOLEY CATH W/STRAP (MISCELLANEOUS) ×3 IMPLANT
HOOD PEEL AWAY FLYTE STAYCOOL (MISCELLANEOUS) ×6 IMPLANT
KIT TURNOVER KIT A (KITS) ×3 IMPLANT
LINER MARATHON 10D 36X54 (Hips) ×1 IMPLANT
LINER MARATHON 10DEG 36X54 (Hips) ×2 IMPLANT
MANIFOLD NEPTUNE II (INSTRUMENTS) ×3 IMPLANT
NDL SAFETY ECLIPSE 18X1.5 (NEEDLE) ×1 IMPLANT
NEEDLE HYPO 18GX1.5 SHARP (NEEDLE) ×2
NS IRRIG 500ML POUR BTL (IV SOLUTION) ×3 IMPLANT
OIL CARTRIDGE MAESTRO DRILL (MISCELLANEOUS) ×3
PACK HIP PROSTHESIS (MISCELLANEOUS) ×3 IMPLANT
PENCIL SMOKE ULTRAEVAC 22 CON (MISCELLANEOUS) ×3 IMPLANT
PIN STEIN THRED 5/32 (Pin) ×3 IMPLANT
PULSAVAC PLUS IRRIG FAN TIP (DISPOSABLE) ×3
SOL .9 NS 3000ML IRR  AL (IV SOLUTION) ×2
SOL .9 NS 3000ML IRR UROMATIC (IV SOLUTION) ×1 IMPLANT
SOL PREP PVP 2OZ (MISCELLANEOUS) ×3
SOLUTION PREP PVP 2OZ (MISCELLANEOUS) ×1 IMPLANT
SPONGE DRAIN TRACH 4X4 STRL 2S (GAUZE/BANDAGES/DRESSINGS) ×3 IMPLANT
SROM M HEAD 36MM PLUS 1.5 (Hips) ×3 IMPLANT
STAPLER SKIN PROX 35W (STAPLE) ×3 IMPLANT
SUT ETHIBOND #5 BRAIDED 30INL (SUTURE) ×3 IMPLANT
SUT VIC AB 0 CT1 36 (SUTURE) ×6 IMPLANT
SUT VIC AB 1 CT1 36 (SUTURE) ×6 IMPLANT
SUT VIC AB 2-0 CT1 27 (SUTURE) ×2
SUT VIC AB 2-0 CT1 TAPERPNT 27 (SUTURE) ×1 IMPLANT
SYR 20ML LL LF (SYRINGE) ×3 IMPLANT
TAPE CLOTH 3X10 WHT NS LF (GAUZE/BANDAGES/DRESSINGS) ×3 IMPLANT
TAPE TRANSPORE STRL 2 31045 (GAUZE/BANDAGES/DRESSINGS) ×3 IMPLANT
TIP FAN IRRIG PULSAVAC PLUS (DISPOSABLE) ×1 IMPLANT
TOWEL OR 17X26 4PK STRL BLUE (TOWEL DISPOSABLE) ×3 IMPLANT
TRAY FOLEY MTR SLVR 16FR STAT (SET/KITS/TRAYS/PACK) ×3 IMPLANT

## 2019-03-09 NOTE — H&P (Signed)
The patient has been re-examined, and the chart reviewed, and there have been no interval changes to the documented history and physical.    The risks, benefits, and alternatives have been discussed at length. The patient expressed understanding of the risks benefits and agreed with plans for surgical intervention.  James P. Hooten, Jr. M.D.    

## 2019-03-09 NOTE — Transfer of Care (Signed)
Immediate Anesthesia Transfer of Care Note  Patient: Angelica Newton  Procedure(s) Performed: TOTAL HIP ARTHROPLASTY (Right Hip)  Patient Location: PACU  Anesthesia Type:Spinal  Level of Consciousness: awake, alert  and oriented  Airway & Oxygen Therapy: Patient Spontanous Breathing and Patient connected to face mask oxygen  Post-op Assessment: Report given to RN and Post -op Vital signs reviewed and stable  Post vital signs: Reviewed and stable  Last Vitals:  Vitals Value Taken Time  BP    Temp    Pulse 65 03/09/19 1046  Resp 16 03/09/19 1046  SpO2 100 % 03/09/19 1046  Vitals shown include unvalidated device data.  Last Pain:  Vitals:   03/09/19 0621  TempSrc: Tympanic  PainSc: 0-No pain         Complications: No apparent anesthesia complications

## 2019-03-09 NOTE — Anesthesia Post-op Follow-up Note (Signed)
Anesthesia QCDR form completed.        

## 2019-03-09 NOTE — Anesthesia Procedure Notes (Signed)
Spinal  Patient location during procedure: OR Start time: 03/09/2019 7:16 AM End time: 03/09/2019 7:20 AM Staffing Resident/CRNA: Nelda Marseille, CRNA Performed: resident/CRNA  Preanesthetic Checklist Completed: patient identified, site marked, surgical consent, pre-op evaluation, timeout performed, IV checked, risks and benefits discussed and monitors and equipment checked Spinal Block Patient position: sitting Prep: Betadine Patient monitoring: heart rate, continuous pulse ox, blood pressure and cardiac monitor Approach: midline Location: L3-4 Injection technique: single-shot Needle Needle type: Whitacre and Introducer  Needle gauge: 25 G Needle length: 9 cm Assessment Sensory level: T10 Additional Notes Negative paresthesia. Negative blood return. Positive free-flowing CSF. Expiration date of kit checked and confirmed. Patient tolerated procedure well, without complications.

## 2019-03-09 NOTE — Evaluation (Signed)
Physical Therapy Evaluation Patient Details Name: Angelica Newton MRN: [id number] DOB: 16-Oct-1950 Today's Date: 03/09/2019   History of Present Illness  Pt admitted for R THR and is POD 0 at time of evaluation.  Clinical Impression  Pt is a pleasant 68 year old female who was admitted for R THR- post approach. Pt performs bed mobility, transfers, and ambulation with cga and RW. Pt demonstrates deficits with strength/mobility. Pt educated on hip precautions and WBing prior to initiating mobility. Pt is very motivated to participate. Would benefit from skilled PT to address above deficits and promote optimal return to PLOF. Recommend transition to [place] upon discharge from acute hospitalization.     Follow Up Recommendations Home health PT    Equipment Recommendations  Rolling walker with 5" wheels;3in1 (PT)    Recommendations for Other Services       Precautions / Restrictions Precautions Precautions: Fall;Posterior Hip Precaution Booklet Issued: No Restrictions Weight Bearing Restrictions: Yes RLE Weight Bearing: Weight bearing as tolerated      Mobility  Bed Mobility Overal bed mobility: Needs Assistance Bed Mobility: Supine to Sit     Supine to sit: Min guard     General bed mobility comments: bed mobility performed with safe technique. Needs mod cues for sequencing and maintaining hip precautions. Once seated at EOB, able to sit with supervision  Transfers Overall transfer level: Needs assistance Equipment used: Rolling walker (2 wheeled) Transfers: Sit to/from Stand Sit to Stand: Min guard         General transfer comment: safe technique with RW and demonstrates upright posture.  Ambulation/Gait Ambulation/Gait assistance: Min guard Gait Distance (Feet): 3 Feet Assistive device: Rolling walker (2 wheeled) Gait Pattern/deviations: Step-to pattern     General Gait Details: ambulated to recliner with safe technique. Step to gait pattern.  Stairs             Wheelchair Mobility    Modified Rankin (Stroke Patients Only)       Balance Overall balance assessment: Needs assistance Sitting-balance support: Feet supported Sitting balance-Leahy Scale: Good     Standing balance support: Bilateral upper extremity supported Standing balance-Leahy Scale: Good                               Pertinent Vitals/Pain Pain Assessment: No/denies pain    Home Living Family/patient expects to be discharged to:: Private residence Living Arrangements: Alone Available Help at Discharge: Family(will have daughter staying with her 24/7 post dc) Type of Home: House Home Access: Stairs to enter Entrance Stairs-Rails: None Entrance Stairs-Number of Steps: 2 Home Layout: One level Home Equipment: None      Prior Function Level of Independence: Independent         Comments: active prior, taking spin class     Hand Dominance        Extremity/Trunk Assessment   Upper Extremity Assessment Upper Extremity Assessment: Overall WFL for tasks assessed    Lower Extremity Assessment Lower Extremity Assessment: Generalized weakness(R LE grossly 3/5; L LE grossly 5/5)       Communication   Communication: No difficulties  Cognition Arousal/Alertness: Awake/alert Behavior During Therapy: WFL for tasks assessed/performed Overall Cognitive Status: Within Functional Limits for tasks assessed  General Comments      Exercises Other Exercises Other Exercises: supine ther-ex performed on R LE including AP, glut sets, quad sets, and hip abd/add. All ther-ex performed x 10 reps with supervision   Assessment/Plan    PT Assessment Patient needs continued PT services  PT Problem List Decreased strength;Decreased mobility;Decreased knowledge of use of DME;Decreased knowledge of precautions       PT Treatment Interventions DME instruction;Gait training;Stair  training;Therapeutic exercise;Balance training    PT Goals (Current goals can be found in the Care Plan section)  Acute Rehab PT Goals Patient Stated Goal: to return to spin class PT Goal Formulation: With patient Time For Goal Achievement: 03/23/19 Potential to Achieve Goals: Good    Frequency BID   Barriers to discharge        Co-evaluation               AM-PAC PT "6 Clicks" Mobility  Outcome Measure Help needed turning from your back to your side while in a flat bed without using bedrails?: A Little Help needed moving from lying on your back to sitting on the side of a flat bed without using bedrails?: A Little Help needed moving to and from a bed to a chair (including a wheelchair)?: A Little Help needed standing up from a chair using your arms (e.g., wheelchair or bedside chair)?: A Little Help needed to walk in hospital room?: A Little Help needed climbing 3-5 steps with a railing? : A Lot 6 Click Score: 17    End of Session Equipment Utilized During Treatment: Gait belt Activity Tolerance: Patient tolerated treatment well Patient left: in chair;with chair alarm set;with SCD's reapplied Nurse Communication: Mobility status PT Visit Diagnosis: Muscle weakness (generalized) (M62.81);Difficulty in walking, not elsewhere classified (R26.2)    Time: [id number] PT Time Calculation (min) (ACUTE ONLY): 38 min   Charges:   PT Evaluation $PT Eval Low Complexity: 1 Low PT Treatments $Therapeutic Exercise: 23-37 mins        Greggory Stallion, PT, DPT [phone]   Alandis Bluemel 03/09/2019, 4:30 PM

## 2019-03-09 NOTE — Op Note (Signed)
OPERATIVE NOTE  DATE OF SURGERY:  03/09/2019  PATIENT NAME:  Angelica Newton   DOB: 04/25/1951  MRN: [id number]  PRE-OPERATIVE DIAGNOSIS: Degenerative arthrosis of the right hip, primary  POST-OPERATIVE DIAGNOSIS:  Same  PROCEDURE:  Right total hip arthroplasty  SURGEON:  Marciano Sequin. M.D.  ASSISTANT: Cassell Smiles, PA-C (present and scrubbed throughout the case, critical for assistance with exposure, retraction, instrumentation, and closure)  ANESTHESIA: spinal  ESTIMATED BLOOD LOSS: 50 mL  FLUIDS REPLACED: 1200 mL of crystalloid  DRAINS: 2 medium drains to a Hemovac reservoir  IMPLANTS UTILIZED: DePuy 18 mm small stature AML femoral stem, 54 mm OD Pinnacle 100 acetabular component, +4 mm 10 degree Pinnacle Marathon polyethylene insert, and a 36 mm M-SPEC +1.5 mm hip ball  INDICATIONS FOR SURGERY: Angelica Newton is a 68 y.o. year old female with a long history of progressive hip and groin  pain. X-rays demonstrated severe degenerative changes. The patient had not seen any significant improvement despite conservative nonsurgical intervention. After discussion of the risks and benefits of surgical intervention, the patient expressed understanding of the risks benefits and agree with plans for total hip arthroplasty.   The risks, benefits, and alternatives were discussed at length including but not limited to the risks of infection, bleeding, nerve injury, stiffness, blood clots, the need for revision surgery, limb length inequality, dislocation, cardiopulmonary complications, among others, and they were willing to proceed.  PROCEDURE IN DETAIL: The patient was brought into the operating room and, after adequate spinal anesthesia was achieved, the patient was placed in a left lateral decubitus position. Axillary roll was placed and all bony prominences were well-padded. The patient's right hip was cleaned and prepped with alcohol and DuraPrep and draped in the usual sterile  fashion. A "timeout" was performed as per usual protocol. A lateral curvilinear incision was made gently curving towards the posterior superior iliac spine. The IT band was incised in line with the skin incision and the fibers of the gluteus maximus were split in line. The piriformis tendon was identified, skeletonized, and incised at its insertion to the proximal femur and reflected posteriorly. A T type posterior capsulotomy was performed. Prior to dislocation of the femoral head, a threaded Steinmann pin was inserted through a separate stab incision into the pelvis superior to the acetabulum and bent in the form of a stylus so as to assess limb length and hip offset throughout the procedure. The femoral head was then dislocated posteriorly. Inspection of the femoral head demonstrated severe degenerative changes with full-thickness loss of articular cartilage. The femoral neck cut was performed using an oscillating saw. The anterior capsule was elevated off of the femoral neck using a periosteal elevator. Attention was then directed to the acetabulum. The remnant of the labrum was excised using electrocautery. Inspection of the acetabulum also demonstrated significant degenerative changes. The acetabulum was reamed in sequential fashion up to a 53 mm diameter. Good punctate bleeding bone was encountered. A 54 mm Pinnacle 100 acetabular component was positioned and impacted into place. Good scratch fit was appreciated. A +4 mm neutral polyethylene trial was inserted.  Attention was then directed to the proximal femur. A hole for reaming of the proximal femoral canal was created using a high-speed burr. The femoral canal was reamed in sequential fashion up to a 17.5 mm diameter. This allowed for approximately 7 cm of scratch fit. Serial broaches were inserted up to a 18 mm small stature femoral broach. Calcar region was planed and a  trial reduction was performed using a 36 mm hip ball with a +1.5 mm neck length.   Reasonably good stability was noted.  However, was elected to trial with the +4 mm 10 degree liner with a high side directed at the 10 o'clock position.  Good equalization of limb lengths and hip offset was appreciated and excellent stability was noted both anteriorly and posteriorly. Trial components were removed. The acetabular shell was irrigated with copious amounts of normal saline with antibiotic solution and suctioned dry. A +4 mm 10 degree Pinnacle Marathon polyethylene insert was positioned and impacted into place with the high side at the 10 o'clock position. Next, a 18 mm small stature AML femoral stem was positioned and impacted into place. Excellent scratch fit was appreciated. A trial reduction was again performed with a 36 mm hip ball with a +1.5 mm neck length. Again, good equalization of limb lengths was appreciated and excellent stability appreciated both anteriorly and posteriorly. The hip was then dislocated and the trial hip ball was removed. The Morse taper was cleaned and dried. A 36 mm M-SPEC hip ball with a +1.5 mm neck length was placed on the trunnion and impacted into place. The hip was then reduced and placed through range of motion. Excellent stability was appreciated both anteriorly and posteriorly.  The wound was irrigated with copious amounts of normal saline with antibiotic solution and suctioned dry. Good hemostasis was appreciated. The posterior capsulotomy was repaired using #5 Ethibond. Piriformis tendon was reapproximated to the undersurface of the gluteus medius tendon using #5 Ethibond. Two medium drains were placed in the wound bed and brought out through separate stab incisions to be attached to a Hemovac reservoir. The IT band was reapproximated using interrupted sutures of #1 Vicryl. Subcutaneous tissue was approximated using first #0 Vicryl followed by #2-0 Vicryl. The skin was closed with skin staples.  The patient tolerated the procedure well and was transported to  the recovery room in stable condition.   Marciano Sequin., M.D.

## 2019-03-09 NOTE — Anesthesia Procedure Notes (Signed)
Date/Time: 03/09/2019 7:37 AM Performed by: Nelda Marseille, CRNA Pre-anesthesia Checklist: Patient identified, Emergency Drugs available, Suction available, Patient being monitored and Timeout performed Oxygen Delivery Method: Simple face mask

## 2019-03-09 NOTE — TOC Progression Note (Signed)
Transition of Care [facility]) - Progression Note    Patient Details  Name: Ilaina Bouler MRN: [id number] Date of Birth: [birth year]  Transition of Care [facility]) CM/SW [place], RN Phone Number: 03/09/2019, 1:34 PM  Clinical Narrative:     Requested the price of Lovenox       Expected Discharge Plan and Services                                                 Social Determinants of Health (SDOH) Interventions    Readmission Risk Interventions No flowsheet data found.

## 2019-03-09 NOTE — Progress Notes (Signed)
ADMISSION NOTE:  Pt admitted to room 160 from PACU. Alert and oriented X4. Surgical dressing clean, dry and intact. Hemovac in place. No complaints of pain. Clear liquid tray ordered. Bed in lowest position, call bell in reach, and bed alarm on.

## 2019-03-09 NOTE — Anesthesia Preprocedure Evaluation (Signed)
Anesthesia Evaluation  Patient identified by MRN, date of birth, ID band Patient awake    Reviewed: Allergy & Precautions, H&P , NPO status , Patient's Chart, lab work & pertinent test results  History of Anesthesia Complications (+) PONV and history of anesthetic complications  Airway Mallampati: II  TM Distance: >3 FB Neck ROM: full    Dental  (+) Teeth Intact   Pulmonary neg pulmonary ROS, neg COPD, former smoker,           Cardiovascular (-) angina(-) Past MI and (-) Cardiac Stents + dysrhythmias (LBBB, negative stress test)      Neuro/Psych PSYCHIATRIC DISORDERS Depression negative neurological ROS     GI/Hepatic negative GI ROS, Neg liver ROS, neg GERD  ,  Endo/Other  negative endocrine ROS  Renal/GU      Musculoskeletal   Abdominal   Peds  Hematology negative hematology ROS (+)   Anesthesia Other Findings Past Medical History: No date: Arthritis     Comment:  Right hip No date: Chicken pox No date: Depression No date: Motion sickness     Comment:  in past No date: PONV (postoperative nausea and vomiting) No date: UTI (urinary tract infection) No date: Wears hearing aid     Comment:  bilateral  Past Surgical History: 2007: AUGMENTATION MAMMAPLASTY; Bilateral     Comment:  mastopexy/breast lift 2005: BREAST SURGERY     Comment:  Breast Lift 09/17/2015: COLONOSCOPY WITH PROPOFOL; N/A     Comment:  Procedure: COLONOSCOPY WITH PROPOFOL;  Surgeon: Lucilla Lame, MD;  Location: [place];  Service:               Endoscopy;  Laterality: N/A; 1987: ORIF ULNAR FRACTURE     Comment:  Broken Ulna/plates and screws 075-GRM: POLYPECTOMY     Comment:  Procedure: POLYPECTOMY INTESTINAL;  Surgeon: Lucilla Lame, MD;  Location: [place];  Service:               Endoscopy;;  Sigmoid colon polyp No date: TUBAL LIGATION  BMI    Body Mass Index: 22.14 kg/m      Reproductive/Obstetrics negative OB ROS                             Anesthesia Physical Anesthesia Plan  ASA: II  Anesthesia Plan: Spinal   Post-op Pain Management:    Induction:   PONV Risk Score and Plan: Propofol infusion  Airway Management Planned: Simple Face Mask and Natural Airway  Additional Equipment:   Intra-op Plan:   Post-operative Plan:   Informed Consent: I have reviewed the patients History and Physical, chart, labs and discussed the procedure including the risks, benefits and alternatives for the proposed anesthesia with the patient or authorized representative who has indicated his/her understanding and acceptance.     Dental Advisory Given  Plan Discussed with: Anesthesiologist and CRNA  Anesthesia Plan Comments:         Anesthesia Quick Evaluation

## 2019-03-10 LAB — SURGICAL PATHOLOGY

## 2019-03-10 MED ORDER — SODIUM CHLORIDE 0.9 % IV BOLUS
500.0000 mL | Freq: Once | INTRAVENOUS | Status: AC
  Administered 2019-03-10: 01:00:00 500 mL via INTRAVENOUS

## 2019-03-10 MED ORDER — SODIUM CHLORIDE 0.9 % IV BOLUS
500.0000 mL | Freq: Once | INTRAVENOUS | Status: AC
  Administered 2019-03-10: 500 mL via INTRAVENOUS

## 2019-03-10 NOTE — Evaluation (Signed)
Occupational Therapy Evaluation Patient Details Name: Angelica Newton MRN: [id number] DOB: 04-22-1951 Today's Date: 03/10/2019    History of Present Illness Pt admitted for R THR  (posterior appraoch).   Clinical Impression   Angelica Newton was seen for OT evaluation this date, POD#1 from above surgery. Pt was active and independent in all ADLs/IADLs prior to surgery. She endorses driving and attending regular spin classes when able. Pt is eager to return to PLOF with less pain and improved safety and independence. Pt currently requires minimal assist for LB dressing while in seated position due to pain and limited AROM of R hip. Pt able to recall 3/3 posterior total hip precautions at start of session but unable to verbalize how to implement during ADL and mobility. Pt instructed in posterior total hip precautions and how to implement, self care skills, falls prevention strategies, home/routines modifications, DME/AE for LB bathing and dressing tasks, & compression stocking mgt strategies. At end of session, pt able to recall 3/3 posterior total hip precautions. Pt would benefit from additional instruction in self care skills and techniques to help maintain precautions with or without assistive devices to support recall and carryover prior to discharge. Recommend HHOT upon hospital discharge to maximize pt safety and functional independence during meaningful occupations of daily life.       Follow Up Recommendations  Home health OT    Equipment Recommendations  3 in 1 bedside commode    Recommendations for Other Services       Precautions / Restrictions Precautions Precautions: Fall;Posterior Hip Precaution Booklet Issued: Yes (comment) Restrictions Weight Bearing Restrictions: Yes RLE Weight Bearing: Weight bearing as tolerated      Mobility Bed Mobility Overal bed mobility: Needs Assistance Bed Mobility: Supine to Sit     Supine to sit: Min guard     General bed mobility  comments: Deferred. Pt up in room recliner at star/end of session. Per PT required min guard for EOB with VCs for adherence to hip precautions.  Transfers Overall transfer level: Needs assistance Equipment used: Rolling walker (2 wheeled) Transfers: Sit to/from Stand Sit to Stand: Supervision         General transfer comment: safe technique with cues for hand placement.    Balance Overall balance assessment: Needs assistance Sitting-balance support: Feet supported;No upper extremity supported Sitting balance-Leahy Scale: Good Sitting balance - Comments: Steady sitting, reaching within BOS. Good static and dynamic balance with weight shifts during functional activity this date.   Standing balance support: Bilateral upper extremity supported Standing balance-Leahy Scale: Good                             ADL either performed or assessed with clinical judgement   ADL                                         General ADL Comments: Pt requires min assist for LB ADL mgt due to pain and decreased ROM of her R hip. Min VC's during LB dressing task this date for adherence to posterior THP's, but pt overall demonstrates good safety awareness and adherence.     Vision Baseline Vision/History: Wears glasses Wears Glasses: At all times Patient Visual Report: No change from baseline       Perception     Praxis      Pertinent Vitals/Pain Pain  Assessment: 0-10 Pain Score: 1  Pain Location: R hip Pain Descriptors / Indicators: Sore;Operative site guarding Pain Intervention(s): Limited activity within patient's tolerance;Monitored during session;Premedicated before session     Hand Dominance Right   Extremity/Trunk Assessment Upper Extremity Assessment Upper Extremity Assessment: Overall WFL for tasks assessed   Lower Extremity Assessment Lower Extremity Assessment: RLE deficits/detail;Defer to PT evaluation RLE Deficits / Details: s/p THR (posterior)  with precautions. RLE Coordination: decreased gross motor   Cervical / Trunk Assessment Cervical / Trunk Assessment: Normal   Communication Communication Communication: No difficulties   Cognition Arousal/Alertness: Awake/alert Behavior During Therapy: WFL for tasks assessed/performed Overall Cognitive Status: Within Functional Limits for tasks assessed                                     General Comments       Exercises Other Exercises  Other Exercises: Pt educated on falls prevention strategies, safe use of AE/DME, posterior hip precautions, functional considerations for ADL mgt with posterior hip precautions, & compression stocking mgt. Handouts provided. Pt able to return demonstrate understanding of education provided during functional dressing task using sock aid this date. Would benefit from reinforcement and trial of aditional AE to support LB ADL mgt with posterior THPs.   Shoulder Instructions      Home Living Family/patient expects to be discharged to:: Private residence Living Arrangements: Alone Available Help at Discharge: Family(Will have dtr. staying 24/7 during recovery) Type of Home: House Home Access: Stairs to enter [employer] of Steps: 2 Entrance Stairs-Rails: None Home Layout: One level     Bathroom Shower/Tub: Door;Walk-in Psychologist, prison and probation services: Handicapped height(comfort height)     Home Equipment: Hand held shower head          Prior Functioning/Environment Level of Independence: Independent        Comments: active prior, taking spin class        OT Problem List: Decreased strength;Decreased coordination;Decreased range of motion;Decreased activity tolerance;Decreased safety awareness;Decreased knowledge of use of DME or AE;Decreased knowledge of precautions;Impaired balance (sitting and/or standing)      OT Treatment/Interventions: Self-care/ADL training;Balance training;Therapeutic exercise;Therapeutic  activities;DME and/or AE instruction;Patient/family education    OT Goals(Current goals can be found in the care plan section) Acute Rehab OT Goals Patient Stated Goal: to return to spin class OT Goal Formulation: With patient Time For Goal Achievement: 03/24/19 Potential to Achieve Goals: Good ADL Goals Pt Will Perform Lower Body Bathing: with modified independence;sitting/lateral leans(With LRAD PRN for safety and adherenece to posterior THP's) Pt Will Perform Lower Body Dressing: with min assist;with min guard assist;sit to/from stand(With LRAD PRN for safety and adherenece to posterior THP's) Pt Will Transfer to Toilet: ambulating;bedside commode;regular height toilet;with modified independence(With LRAD PRN for safety and adherenece to posterior THP's) Pt Will Perform Toileting - Clothing Manipulation and hygiene: sit to/from stand;with supervision;with set-up(With LRAD PRN for safety and adherenece to posterior THP's)  OT Frequency: Min 1X/week   Barriers to D/C:            Co-evaluation              AM-PAC OT "6 Clicks" Daily Activity     Outcome Measure Help from another person eating meals?: None Help from another person taking care of personal grooming?: None Help from another person toileting, which includes using toliet, bedpan, or urinal?: A Little Help from another person bathing (including  washing, rinsing, drying)?: A Little Help from another person to put on and taking off regular upper body clothing?: A Little Help from another person to put on and taking off regular lower body clothing?: A Little 6 Click Score: 20   End of Session    Activity Tolerance: Patient tolerated treatment well Patient left: in chair;with call bell/phone within reach;with chair alarm set;with SCD's reapplied  OT Visit Diagnosis: Other abnormalities of gait and mobility (R26.89)                Time: [id number] OT Time Calculation (min): 33 min Charges:  OT General Charges $OT  Visit: 1 Visit OT Evaluation $OT Eval Low Complexity: 1 Low OT Treatments $Self Care/Home Management : 23-37 mins  Shara Blazing, M.S., OTR/L Ascom: [phone] 03/10/19, 11:49 AM

## 2019-03-10 NOTE — TOC Benefit Eligibility Note (Signed)
Transition of Care [facility]) Benefit Eligibility Note    Patient Details  Name: [name] MRN: [id number] Date of Birth: 1951/02/11   Medication/Dose: Lovenox 40mg  daily x 14 days or Generic           Spoke with Person/Company/Phone Number:: Latisha[employer]/[phone]  Co-Pay: No Rx benefits with this plan        Additional Notes: Plan G - Only covers medical, no [place] Phone Number: 03/10/2019, 9:08 AM

## 2019-03-10 NOTE — Progress Notes (Signed)
  Subjective: 1 Day Post-Op Procedure(s) (LRB): TOTAL HIP ARTHROPLASTY (Right) Patient reports pain as well-controlled.   Patient is well, and has had no acute complaints or problems Plan is to go Home after hospital stay. Negative for chest pain and shortness of breath Fever: no Gastrointestinal: Negative  for nausea and vomiting  Objective: Vital signs in last 24 hours: Temp:  [96.8 F (36 C)-98.2 F (36.8 C)] 97.8 F (36.6 C) (10/01 0720) Pulse Rate:  [56-72] 57 (10/01 0720) Resp:  [10-21] 18 (10/01 0720) BP: (86-154)/(38-99) 103/53 (10/01 0720) SpO2:  [95 %-100 %] 100 % (10/01 0720)  Intake/Output from previous day:  Intake/Output Summary (Last 24 hours) at 03/10/2019 0756 Last data filed at 03/10/2019 0600 Gross per 24 hour  Intake 4583.8 ml  Output 2220 ml  Net 2363.8 ml    Intake/Output this shift: No intake/output data recorded.  Labs: No results for input(s): HGB in the last 72 hours. No results for input(s): WBC, RBC, HCT, PLT in the last 72 hours. No results for input(s): NA, K, CL, CO2, BUN, CREATININE, GLUCOSE, CALCIUM in the last 72 hours. No results for input(s): LABPT, INR in the last 72 hours.   EXAM General - Patient is Alert, Appropriate and Oriented Extremity - Neurovascular intact Dorsiflexion/Plantar flexion intact Dressing/Incision - clean, no drainage Motor Function - intact, moving foot and toes well on exam.    Assessment/Plan: 1 Day Post-Op Procedure(s) (LRB): TOTAL HIP ARTHROPLASTY (Right) Active Problems:   H/O total hip arthroplasty  Estimated body mass index is 22.14 kg/m as calculated from the following:   Height as of this encounter: 5\' 9"  (1.753 m).   Weight as of this encounter: 68 kg. Advance diet Up with therapy  Continue monitoring BP.  DVT Prophylaxis - Lovenox Weight-Bearing as tolerated to right leg. Posterior hip precautions.   Cassell Smiles, PA-C [facility] Orthopaedic Surgery 03/10/2019, 7:56 AM 2

## 2019-03-10 NOTE — TOC Initial Note (Signed)
Transition of Care [facility]) - Initial/Assessment Note    Patient Details  Name: Angelica Newton MRN: [id number] Date of Birth: 1950-10-09  [facility]) CM/SW Contact:    Su Hilt, RN Phone Number: 03/10/2019, 10:05 AM  Clinical Narrative:                  Met with the patient to discuss DC plan and needs  She lives alone but her daughter will be staying with her 24/7 and will provide transportation She is up to date with her PCP She can afford her medications Unable to [place] Price of Lovenox, provided her with a good RX card in case it is more than $97 that it would be with [facility] [place] I explained how to use the [facility] card if needed She does not have any DME at home and would benefit from getting a RW and 3 in 1, I notified Brad with [employer] She would like to use [facility] I notified Helene Kelp  Will continue to monitor for additional needs Expected Discharge Plan: [place] Barriers to Discharge: Continued Medical Work up   Patient Goals and CMS Choice Patient states their goals for this hospitalization and ongoing recovery are:: Go home      Expected Discharge Plan and Services Expected Discharge Plan: [place]   Discharge Planning Services: CM Consult   Living arrangements for the past 2 months: Single Family Home                 DME Arranged: 3-N-1, Walker rolling DME Agency: [employer] Date DME Agency Contacted: 03/10/19 Time DME Agency Contacted: 1003 Representative spoke with at DME Agency: [place]: PT [place]: [facility] (formerly [employer]) Date [place]: 03/10/19 Time [place] Contacted: 1004 Representative spoke with at [place]: Helene Kelp  Prior Living Arrangements/Services Living arrangements for the past 2 months: [place] Lives with:: Self Patient language and need for interpreter reviewed:: Yes Do you feel safe going back to the place  where you live?: Yes      Need for Family Participation in Patient Care: No (Comment) Care giver support system in place?: Yes (comment)   Criminal Activity/Legal Involvement Pertinent to Current Situation/Hospitalization: No - Comment as needed  Activities of Daily Living Home Assistive Devices/Equipment: Eyeglasses, Hearing aid ADL Screening (condition at time of admission) Patient's cognitive ability adequate to safely complete daily activities?: Yes Is the patient deaf or have difficulty hearing?: No Does the patient have difficulty seeing, even when wearing glasses/contacts?: No Does the patient have difficulty concentrating, remembering, or making decisions?: No Patient able to express need for assistance with ADLs?: Yes Does the patient have difficulty dressing or bathing?: No Independently performs ADLs?: Yes (appropriate for developmental age) Does the patient have difficulty walking or climbing stairs?: No Weakness of Legs: None Weakness of Arms/Hands: None  Permission Sought/Granted   Permission granted to share information with : Yes, Verbal Permission Granted              Emotional Assessment Appearance:: Appears stated age Attitude/Demeanor/Rapport: Engaged Affect (typically observed): Appropriate Orientation: : Oriented to Self, Oriented to Place, Oriented to  Time, Oriented to Situation Alcohol / Substance Use: Not Applicable Psych Involvement: No (comment)  Admission diagnosis:  PRIMARY OSTEOARTHRITIS OF RIGHT HIP Patient Active Problem List   Diagnosis Date Noted  . H/O total hip arthroplasty 03/09/2019  . Left bundle branch  block (LBBB) 03/02/2019  . Primary osteoarthritis of right hip 01/09/2019  . Seborrheic keratosis 05/10/2018  . Osteoporosis 07/20/2017  . Eustachian tube dysfunction 07/20/2017  . Light headedness 05/08/2017  . Vision changes 05/08/2017  . Rash and nonspecific skin eruption 11/28/2015  . Benign neoplasm of sigmoid colon   .  Hyperlipidemia 05/10/2015  . Major depression in remission ([place]) 11/16/2014   PCP:  Leone Haven, MD Pharmacy:   [facility] [phone] [address], [address] [place] [address] Phone: [phone] Fax: [phone]     Social Determinants of Health (SDOH) Interventions    Readmission Risk Interventions No flowsheet data found.

## 2019-03-10 NOTE — Progress Notes (Signed)
Physical Therapy Treatment Patient Details Name: Angelica Newton MRN: [id number] DOB: 1950/12/21 Today's Date: 03/10/2019    History of Present Illness Pt admitted for R THR  (posterior appraoch).    PT Comments    Stood from recliner and was able to complete lap around unit this pm.  No c/o dizziness noted.  Returned to supine with min a x 1 for LE's and settled for a nap per her request.  Stair training in AM.  Follow Up Recommendations  Home health PT     Equipment Recommendations  Rolling walker with 5" wheels;3in1 (PT)    Recommendations for Other Services       Precautions / Restrictions Precautions Precautions: Fall;Posterior Hip Precaution Booklet Issued: Yes (comment) Restrictions Weight Bearing Restrictions: Yes RLE Weight Bearing: Weight bearing as tolerated    Mobility  Bed Mobility Overal bed mobility: Needs Assistance Bed Mobility: Sit to Supine     Supine to sit: Min guard Sit to supine: Min assist   General bed mobility comments: for LE's  Transfers Overall transfer level: Needs assistance Equipment used: Rolling walker (2 wheeled) Transfers: Sit to/from Stand Sit to Stand: Supervision         General transfer comment: safe technique with cues for hand placement.  Ambulation/Gait Ambulation/Gait assistance: Min guard Gait Distance (Feet): 160 Feet Assistive device: Rolling walker (2 wheeled) Gait Pattern/deviations: Step-through pattern     General Gait Details: progressed to reciprocal gait with minimal cues. Safe technique with cues for upright posture. Slight fatigue.   Stairs             Wheelchair Mobility    Modified Rankin (Stroke Patients Only)       Balance Overall balance assessment: Needs assistance Sitting-balance support: Feet supported;No upper extremity supported Sitting balance-Leahy Scale: Good Sitting balance - Comments: Steady sitting, reaching within BOS. Good static and dynamic balance with  weight shifts during functional activity this date.   Standing balance support: Bilateral upper extremity supported Standing balance-Leahy Scale: Good                              Cognition Arousal/Alertness: Awake/alert Behavior During Therapy: WFL for tasks assessed/performed Overall Cognitive Status: Within Functional Limits for tasks assessed                                        Exercises Other Exercises Other Exercises: supine ther-ex performed on R LE including AP, glut sets, quad sets, SAQ, and hip abd/add. All ther-ex performed x 12 reps with cga. Other Exercises: Pt educated on falls prevention strategies, safe use of AE/DME, posterior hip precautions, functional considerations for ADL mgt with posterior hip precautions, & compression stocking mgt. Handouts provided. Pt able to return demonstrate understanding of education provided during functional dressing task using sock aid this date. Would benefit from reinforcement and trial of aditional AE to support LB ADL mgt with posterior THPs.    General Comments        Pertinent Vitals/Pain Pain Assessment: 0-10 Pain Score: 2  Pain Location: R hip Pain Descriptors / Indicators: Sore;Operative site guarding Pain Intervention(s): Limited activity within patient's tolerance;Monitored during session    [place] expects to be discharged to:: Private residence Living Arrangements: Alone Available Help at Discharge: Family(Will have dtr. staying 24/7 during recovery) Type of Home: House Home Access:  Stairs to enter Entrance Stairs-Rails: None Home Layout: One level Home Equipment: Hand held shower head      Prior Function Level of Independence: Independent      Comments: active prior, taking spin class   PT Goals (current goals can now be found in the care plan section) Acute Rehab PT Goals Patient Stated Goal: to return to spin class PT Goal Formulation: With patient Time  For Goal Achievement: 03/23/19 Potential to Achieve Goals: Good Progress towards PT goals: Progressing toward goals    Frequency    BID      PT Plan Current plan remains appropriate    Co-evaluation              AM-PAC PT "6 Clicks" Mobility   Outcome Measure  Help needed turning from your back to your side while in a flat bed without using bedrails?: A Little Help needed moving from lying on your back to sitting on the side of a flat bed without using bedrails?: A Little Help needed moving to and from a bed to a chair (including a wheelchair)?: A Little Help needed standing up from a chair using your arms (e.g., wheelchair or bedside chair)?: A Little Help needed to walk in hospital room?: A Little Help needed climbing 3-5 steps with a railing? : A Little 6 Click Score: 18    End of Session Equipment Utilized During Treatment: Gait belt Activity Tolerance: Patient tolerated treatment well Patient left: in bed;with call bell/phone within reach;with bed alarm set;with SCD's reapplied;with family/visitor present Nurse Communication: Mobility status PT Visit Diagnosis: Muscle weakness (generalized) (M62.81);Difficulty in walking, not elsewhere classified (R26.2)     Time: 1350-1401 PT Time Calculation (min) (ACUTE ONLY): 11 min  Charges:  $Gait Training: 8-22 mins $Therapeutic Exercise: 8-22 mins                    Chesley Noon, PTA 03/10/19, 2:28 PM

## 2019-03-10 NOTE — Progress Notes (Signed)
Pts BP 90/43. Pt asymptomatic. MD Hooten notified. No orders received. Will continue to monitor.

## 2019-03-10 NOTE — Progress Notes (Signed)
Pt hypotensive the entire shift, pt asymptomatic, MD Hooten notified, 500 cc bolus NS ordered and administered, BP still low. Pt placed in Trendelenburg position and manually rechecked, slightly better but still low, MD notified.

## 2019-03-10 NOTE — Anesthesia Postprocedure Evaluation (Signed)
Anesthesia Post Note  Patient: Angelica Newton  Procedure(s) Performed: TOTAL HIP ARTHROPLASTY (Right Hip)  Patient location during evaluation: Nursing Unit Anesthesia Type: Spinal Level of consciousness: oriented and awake and alert Pain management: pain level controlled Vital Signs Assessment: post-procedure vital signs reviewed and stable Respiratory status: spontaneous breathing and respiratory function stable Cardiovascular status: blood pressure returned to baseline and stable Postop Assessment: no headache, no backache and no apparent nausea or vomiting Anesthetic complications: no     Last Vitals:  Vitals:   03/10/19 0650 03/10/19 0720  BP: (!) 104/52 (!) 103/53  Pulse: 61 (!) 57  Resp:  18  Temp:  36.6 C  SpO2: 99% 100%    Last Pain:  Vitals:   03/10/19 0726  TempSrc:   PainSc: 0-No pain                 Traivon Morrical B [facility]

## 2019-03-10 NOTE — Progress Notes (Signed)
Pts daughter took [facility] and walker home.

## 2019-03-10 NOTE — Progress Notes (Signed)
Physical Therapy Treatment Patient Details Name: Angelica Newton MRN: [id number] DOB: 01/22/1951 Today's Date: 03/10/2019    History of Present Illness Pt admitted for R THR and is POD 0 at time of evaluation.    PT Comments    Pt is making good progress towards goals with increased ambulation distance noted this date. Cues for gait sequencing in hallway. INcreased gait speed noted. Reviewed hip precautions, able to recall 2/3 easily. Good endurance with HEP. Will continue to progress as able.   Follow Up Recommendations  Home health PT     Equipment Recommendations  Rolling walker with 5" wheels;3in1 (PT)    Recommendations for Other Services       Precautions / Restrictions Precautions Precautions: Fall;Posterior Hip Precaution Booklet Issued: Yes (comment) Restrictions Weight Bearing Restrictions: Yes RLE Weight Bearing: Weight bearing as tolerated    Mobility  Bed Mobility Overal bed mobility: Needs Assistance Bed Mobility: Supine to Sit     Supine to sit: Min guard     General bed mobility comments: bed mobility performed with cues for maintaining hip precautions. Once seated at EOB, no symptoms of dizziness.  Transfers Overall transfer level: Needs assistance Equipment used: Rolling walker (2 wheeled) Transfers: Sit to/from Stand Sit to Stand: Supervision         General transfer comment: safe technique with cues for hand placement.  Ambulation/Gait Ambulation/Gait assistance: Min guard Gait Distance (Feet): 110 Feet Assistive device: Rolling walker (2 wheeled) Gait Pattern/deviations: Step-through pattern     General Gait Details: progressed to reciprocal gait with minimal cues. Safe technique with cues for upright posture. Slight fatigue.   Stairs             Wheelchair Mobility    Modified Rankin (Stroke Patients Only)       Balance Overall balance assessment: Needs assistance Sitting-balance support: Feet  supported Sitting balance-Leahy Scale: Good     Standing balance support: Bilateral upper extremity supported Standing balance-Leahy Scale: Good                              Cognition Arousal/Alertness: Awake/alert Behavior During Therapy: WFL for tasks assessed/performed Overall Cognitive Status: Within Functional Limits for tasks assessed                                        Exercises Other Exercises Other Exercises: supine ther-ex performed on R LE including AP, glut sets, quad sets, SAQ, and hip abd/add. All ther-ex performed x 12 reps with cga.    General Comments        Pertinent Vitals/Pain Pain Assessment: 0-10 Pain Score: 1  Pain Location: R hip Pain Descriptors / Indicators: Operative site guarding Pain Intervention(s): Limited activity within patient's tolerance;Ice applied    Home Living                      Prior Function            PT Goals (current goals can now be found in the care plan section) Acute Rehab PT Goals Patient Stated Goal: to return to spin class PT Goal Formulation: With patient Time For Goal Achievement: 03/23/19 Potential to Achieve Goals: Good Progress towards PT goals: Progressing toward goals    Frequency    BID      PT Plan Current plan remains  appropriate    Co-evaluation              AM-PAC PT "6 Clicks" Mobility   Outcome Measure  Help needed turning from your back to your side while in a flat bed without using bedrails?: A Little Help needed moving from lying on your back to sitting on the side of a flat bed without using bedrails?: A Little Help needed moving to and from a bed to a chair (including a wheelchair)?: A Little Help needed standing up from a chair using your arms (e.g., wheelchair or bedside chair)?: A Little Help needed to walk in hospital room?: A Little Help needed climbing 3-5 steps with a railing? : A Lot 6 Click Score: 17    End of Session  Equipment Utilized During Treatment: Gait belt Activity Tolerance: Patient tolerated treatment well Patient left: in chair;with chair alarm set;with SCD's reapplied Nurse Communication: Mobility status PT Visit Diagnosis: Muscle weakness (generalized) (M62.81);Difficulty in walking, not elsewhere classified (R26.2)     Time: [id number] PT Time Calculation (min) (ACUTE ONLY): 25 min  Charges:  $Gait Training: 8-22 mins $Therapeutic Exercise: 8-22 mins                     Greggory Stallion, PT, DPT [phone]    [name] 03/10/2019, 11:09 AM

## 2019-03-11 MED ORDER — ENOXAPARIN SODIUM 40 MG/0.4ML ~~LOC~~ SOLN: 40.0000 mg | SUBCUTANEOUS | 0 refills | Status: DC

## 2019-03-11 MED ORDER — TRAMADOL HCL 50 MG PO TABS: 50.0000 mg | ORAL_TABLET | ORAL | 1 refills | Status: DC | PRN

## 2019-03-11 MED ORDER — OXYCODONE HCL 5 MG PO TABS: 5.0000 mg | ORAL_TABLET | ORAL | 0 refills | Status: DC | PRN

## 2019-03-11 MED ORDER — CELECOXIB 200 MG PO CAPS: 200.0000 mg | ORAL_CAPSULE | Freq: Two times a day (BID) | ORAL | 1 refills | Status: DC

## 2019-03-11 NOTE — TOC Transition Note (Signed)
Transition of Care [facility]) - CM/SW Discharge Note   Patient Details  Name: Angelica Newton MRN: [id number] Date of Birth: 03-22-1951  [facility]) CM/SW Contact:  Su Hilt, RN Phone Number: 03/11/2019, 11:03 AM   Clinical Narrative:     Patient to DC home today with her daughter [facility]W and [facility] was already taken home She is set up with [facility] No additional needs  Final next level of care: Home w Home Health Services Barriers to Discharge: Barriers Resolved   Patient Goals and CMS Choice Patient states their goals for this hospitalization and ongoing recovery are:: Go home      Discharge Placement                       Discharge Plan and Services   Discharge Planning Services: CM Consult            DME Arranged: 3-N-1, Walker rolling DME Agency: [employer] Date DME Agency Contacted: 03/10/19 Time DME Agency Contacted: 75 Representative spoke with at DME Agency: [place]: PT [place]: [facility] [employer] Date [place]: 03/10/19 Time [place]: 1004 Representative spoke with at [place]: [place] ([place]) Interventions     Readmission Risk Interventions No flowsheet data found.

## 2019-03-11 NOTE — Discharge Summary (Signed)
Physician Discharge Summary  Patient ID: Angelica Newton MRN: [id number] DOB/AGE: May 14, 1951 68 y.o.  Admit date: 03/09/2019 Discharge date: 03/11/2019  Admission Diagnoses:  PRIMARY OSTEOARTHRITIS OF RIGHT HIP  Surgeries:   PROCEDURE:  Right total hip arthroplasty on 03/09/19  SURGEON:  Marciano Sequin. M.D.  ASSISTANT: Cassell Smiles, PA-C (present and scrubbed throughout the case, critical for assistance with exposure, retraction, instrumentation, and closure)  ANESTHESIA: spinal  ESTIMATED BLOOD LOSS: 50 mL  FLUIDS REPLACED: 1200 mL of crystalloid  DRAINS: 2 medium drains to a Hemovac reservoir  IMPLANTS UTILIZED: DePuy 18 mm small stature AML femoral stem, 54 mm OD Pinnacle 100 acetabular component, +4 mm 10 degree Pinnacle Marathon polyethylene insert, and a 36 mm M-SPEC +1.5 mm hip ball  Discharge Diagnoses: Patient Active Problem List   Diagnosis Date Noted  . H/O total hip arthroplasty 03/09/2019  . Left bundle branch block (LBBB) 03/02/2019  . Primary osteoarthritis of right hip 01/09/2019  . Seborrheic keratosis 05/10/2018  . Osteoporosis 07/20/2017  . Eustachian tube dysfunction 07/20/2017  . Light headedness 05/08/2017  . Vision changes 05/08/2017  . Rash and nonspecific skin eruption 11/28/2015  . Benign neoplasm of sigmoid colon   . Hyperlipidemia 05/10/2015  . Major depression in remission ([place]) 11/16/2014    Past Medical History:  Diagnosis Date  . Arthritis    Right hip  . Chicken pox   . Depression   . Motion sickness    in past  . PONV (postoperative nausea and vomiting)   . UTI (urinary tract infection)   . Wears hearing aid    bilateral     Transfusion: none   Consultants (if any):   Discharged Condition: Improved  Hospital Course: Angelica Newton is an 68 y.o. female who was admitted 03/09/2019 with a diagnosis of right hip osteoarthritis  and went to the operating room on 03/09/2019 and underwent a right total hip  arthroplasty. The patient received perioperative antibiotics for prophylaxis (see below). The patient tolerated the procedure well and was transported to PACU in stable condition. After meeting PACU criteria, the patient was subsequently transferred to the Orthopaedics/Rehabilitation unit.   The patient received DVT prophylaxis in the form of early mobilization, Lovenox. A sacral pad had been placed and heels were elevated off of the bed with rolled towels in order to protect skin integrity. Foley catheter was discontinued on postoperative day #1. Wound drains were discontinued on postoperative day #2. The surgical incision was healing well without signs of infection.  Physical therapy was initiated postoperatively for transfers, gait training, and strengthening. Occupational therapy was initiated for activities of daily living and evaluation for assisted devices. Rehabilitation goals were reviewed in detail with the patient. The patient made steady progress with physical therapy and physical therapy recommended discharge to Home.   The patient achieved his preliminary goals of this hospitalization and was felt to be medically and orthopaedically appropriate for discharge.  She was given perioperative antibiotics:  Anti-infectives (From admission, onward)   Start     Dose/Rate Route Frequency Ordered Stop   03/09/19 1330  ceFAZolin (ANCEF) IVPB 2g/100 mL premix     2 g 200 mL/hr over 30 Minutes Intravenous Every 6 hours 03/09/19 1201 03/10/19 0916   03/09/19 0630  ceFAZolin (ANCEF) IVPB 2g/100 mL premix     2 g 200 mL/hr over 30 Minutes Intravenous On call to O.R. 03/09/19 [id number] 03/09/19 0743   03/09/19 0618  ceFAZolin (ANCEF) 2-4 GM/100ML-% IVPB  Note to Pharmacy: Yevette Edwards   : cabinet override      03/09/19 0618 03/09/19 0728    .  Recent vital signs:  Vitals:   03/10/19 2000 03/10/19 2357  BP: (!) 143/66 (!) 103/52  Pulse: 65 67  Resp: 16 19  Temp: 98.1 F (36.7 C) 98.7 F  (37.1 C)  SpO2: 100% 98%    Recent laboratory studies:  No results for input(s): WBC, HGB, HCT, PLT, K, CL, CO2, BUN, CREATININE, GLUCOSE, CALCIUM, LABPT, INR in the last 72 hours.  Diagnostic Studies: Dg Hip Port Unilat With Pelvis 1v Right  Result Date: 03/09/2019 CLINICAL DATA:  Post hip replacement EXAM: DG HIP (WITH OR WITHOUT PELVIS) 1V PORT RIGHT COMPARISON:  November 28, 2015 FINDINGS: There are expected postsurgical changes related to total hip arthroplasty on the right. Surgical drains are noted. The alignment appears near anatomic. There is overlying soft tissue edema and subcutaneous gas. IMPRESSION: Expected postoperative changes related to total hip arthroplasty on the right. No complicating features. Electronically Signed   By: Constance Holster M.D.   On: 03/09/2019 11:42    Discharge Medications:   Allergies as of 03/11/2019      Reactions   Lipitor [atorvastatin]    "feels terrible"   Tetracyclines & Related Other (See Comments)   Just felt lousey   Zocor [simvastatin] Other (See Comments)   Flu type symptoms   Mango Butter Rash   Just MANGOS not MANGO BUTTER Swollen lips      Medication List    STOP taking these medications   aspirin 325 MG tablet     TAKE these medications   CALCIUM-MAGNESIUM-ZINC-D3 PO Take 2 tablets by mouth every evening.   celecoxib 200 MG capsule Commonly known as: CELEBREX Take 1 capsule (200 mg total) by mouth 2 (two) times daily.   enoxaparin 40 MG/0.4ML injection Commonly known as: LOVENOX Inject 0.4 mLs (40 mg total) into the skin daily for 14 doses.   ezetimibe 10 MG tablet Commonly known as: ZETIA Take 1 tablet (10 mg total) by mouth daily. What changed: when to take this   LUTEIN 20 PO Take 20 mg by mouth every evening.   multivitamin with minerals Tabs tablet Take 1 tablet by mouth every evening. Women's One A Day   oxyCODONE 5 MG immediate release tablet Commonly known as: Oxy IR/ROXICODONE Take 1 tablet (5 mg  total) by mouth every 4 (four) hours as needed for moderate pain (pain score 4-6).   traMADol 50 MG tablet Commonly known as: ULTRAM Take 1-2 tablets (50-100 mg total) by mouth every 4 (four) hours as needed for moderate pain.   Vitamin D3 50 MCG (2000 UT) Tabs Take 2,000 Units by mouth every evening.            Durable Medical Equipment  (From admission, onward)         Start     Ordered   03/09/19 1202  DME Walker rolling  Once    Question:  Patient needs a walker to treat with the following condition  Answer:  S/P total hip arthroplasty   03/09/19 1201   03/09/19 1202  DME Bedside commode  Once    Question:  Patient needs a bedside commode to treat with the following condition  Answer:  S/P total hip arthroplasty   03/09/19 1201          Disposition: Discharge disposition: 01-Home or Self Care         Follow-up  Information    Hooten, Laurice Record, MD On 04/21/2019.   Specialty: Orthopedic Surgery Why: at 2:00pm Contact information: [address] [place] [address] [place], PA-C 03/11/2019, 7:21 AM

## 2019-03-11 NOTE — Progress Notes (Signed)
Dc instructions and rx given.  Follow up and home health info given. VSS, Iv removed. Dressing changed.  Daughter at bedside.  DC home via WC with dtr.

## 2019-03-11 NOTE — Progress Notes (Signed)
  Subjective: 2 Days Post-Op Procedure(s) (LRB): TOTAL HIP ARTHROPLASTY (Right) Patient reports pain as well controlled.   Patient is well, and has had no acute complaints or problems Plan is to go Home after hospital stay. Negative for chest pain and shortness of breath Fever: no Gastrointestinal:Negative for nausea and vomiting  Patient reports having PT 2x yesterday. She has not yet had a BM.  Objective: Vital signs in last 24 hours: Temp:  [98 F (36.7 C)-98.7 F (37.1 C)] 98 F (36.7 C) (10/02 0727) Pulse Rate:  [58-67] 63 (10/02 0727) Resp:  [16-19] 19 (10/01 2357) BP: (90-159)/(43-68) 159/68 (10/02 0727) SpO2:  [98 %-100 %] 98 % (10/02 0727)  Intake/Output from previous day:  Intake/Output Summary (Last 24 hours) at 03/11/2019 0732 Last data filed at 03/11/2019 0456 Gross per 24 hour  Intake 840 ml  Output 20 ml  Net 820 ml    Intake/Output this shift: No intake/output data recorded.  Labs: No results for input(s): HGB in the last 72 hours. No results for input(s): WBC, RBC, HCT, PLT in the last 72 hours. No results for input(s): NA, K, CL, CO2, BUN, CREATININE, GLUCOSE, CALCIUM in the last 72 hours. No results for input(s): LABPT, INR in the last 72 hours.   EXAM General - Patient is Alert, Appropriate and Oriented Extremity - Neurovascular intact Dorsiflexion/Plantar flexion intact Compartment soft Dressing/Incision - clean, dry, no drainage. Drain removed. Motor Function - intact, moving foot and toes well on exam.    Assessment/Plan: 2 Days Post-Op Procedure(s) (LRB): TOTAL HIP ARTHROPLASTY (Right) Active Problems:   H/O total hip arthroplasty  Estimated body mass index is 22.14 kg/m as calculated from the following:   Height as of this encounter: 5\' 9"  (1.753 m).   Weight as of this encounter: 68 kg. Advance diet Up with therapy Discharge home with home health  DVT Prophylaxis - Lovenox Weight-Bearing as tolerated to right leg Drain removed  today.  Cassell Smiles, PA-C [facility] Orthopaedic Surgery 03/11/2019, 7:32 AM

## 2019-03-11 NOTE — Progress Notes (Signed)
Physical Therapy Treatment Patient Details Name: Angelica Newton MRN: [id number] DOB: Oct 21, 1950 Today's Date: 03/11/2019    History of Present Illness Pt admitted for R THR  (posterior appraoch).    PT Comments    Pt is progressing toward goals. Upon entry, pt is seated in recliner and reports minimal pain. Pt is able to perform transfers and amb with supervision from PT and use of RW. Pt was able to navigate stairs safely using RW following PT instruction. Pt will continue to benefit from skilled PT to address impairments of strength/ROM/mobility.    Follow Up Recommendations  Home health PT     Equipment Recommendations  Rolling walker with 5" wheels;3in1 (PT)    Recommendations for Other Services       Precautions / Restrictions Precautions Precautions: Fall;Posterior Hip Precaution Booklet Issued: Yes (comment) Restrictions Weight Bearing Restrictions: Yes RLE Weight Bearing: Weight bearing as tolerated    Mobility  Bed Mobility               General bed mobility comments: Pt in recliner at start and end of session  Transfers Overall transfer level: Needs assistance Equipment used: Rolling walker (2 wheeled) Transfers: Sit to/from Stand Sit to Stand: Supervision         General transfer comment: Pt demonstrates proper hand placement today  Ambulation/Gait Ambulation/Gait assistance: Supervision Gait Distance (Feet): 200 Feet Assistive device: Rolling walker (2 wheeled) Gait Pattern/deviations: Step-through pattern     General Gait Details: Able to amb with reciprocal gait pattern; cueing for caution when turning to prevent toeing in.   Stairs Stairs: Yes Stairs assistance: Min guard Stair Management: Backwards;With walker Number of Stairs: 2 General stair comments: PT demonstrated prior; Pt able to perform stair practice safely   Wheelchair Mobility    Modified Rankin (Stroke Patients Only)       Balance Overall balance  assessment: Needs assistance Sitting-balance support: Feet supported;No upper extremity supported Sitting balance-Leahy Scale: Good     Standing balance support: Bilateral upper extremity supported Standing balance-Leahy Scale: Good                              Cognition Arousal/Alertness: Awake/alert Behavior During Therapy: WFL for tasks assessed/performed Overall Cognitive Status: Within Functional Limits for tasks assessed                                        Exercises Other Exercises Other Exercises: seated in recliner, 15 reps: glute sets, hip add/abd, quad sets; supervision from PT, min guard for hip add/abd Other Exercises: Pt taken to bathroom during session; able to perform activities    General Comments        Pertinent Vitals/Pain Pain Assessment: 0-10 Pain Score: 2  Pain Descriptors / Indicators: Sore;Operative site guarding Pain Intervention(s): Monitored during session;Repositioned;Limited activity within patient's tolerance    Home Living                      Prior Function            PT Goals (current goals can now be found in the care plan section) Acute Rehab PT Goals Patient Stated Goal: to return to spin class PT Goal Formulation: With patient Time For Goal Achievement: 03/23/19 Potential to Achieve Goals: Good Progress towards PT goals: Progressing toward goals  Frequency    BID      PT Plan Current plan remains appropriate    Co-evaluation              AM-PAC PT "6 Clicks" Mobility   Outcome Measure  Help needed turning from your back to your side while in a flat bed without using bedrails?: A Little Help needed moving from lying on your back to sitting on the side of a flat bed without using bedrails?: A Little Help needed moving to and from a bed to a chair (including a wheelchair)?: A Little Help needed standing up from a chair using your arms (e.g., wheelchair or bedside chair)?: A  Little Help needed to walk in hospital room?: A Little Help needed climbing 3-5 steps with a railing? : A Little 6 Click Score: 18    End of Session Equipment Utilized During Treatment: Gait belt Activity Tolerance: Patient tolerated treatment well Patient left: in bed;with call bell/phone within reach;with bed alarm set;with SCD's reapplied;with family/visitor present Nurse Communication: Mobility status PT Visit Diagnosis: Muscle weakness (generalized) (M62.81);Difficulty in walking, not elsewhere classified (R26.2)     Time: 0950-1020 PT Time Calculation (min) (ACUTE ONLY): 30 min  Charges:  $Gait Training: 8-22 mins $Therapeutic Exercise: 8-22 mins                     Sagan Wurzel, SPT    [name] 03/11/2019, 10:53 AM

## 2019-03-13 DIAGNOSIS — M810 Age-related osteoporosis without current pathological fracture: Secondary | ICD-10-CM | POA: Diagnosis not present

## 2019-03-13 DIAGNOSIS — D125 Benign neoplasm of sigmoid colon: Secondary | ICD-10-CM | POA: Diagnosis not present

## 2019-03-13 DIAGNOSIS — Z471 Aftercare following joint replacement surgery: Secondary | ICD-10-CM | POA: Diagnosis not present

## 2019-03-13 DIAGNOSIS — Z87440 Personal history of urinary (tract) infections: Secondary | ICD-10-CM | POA: Diagnosis not present

## 2019-03-13 DIAGNOSIS — Z7901 Long term (current) use of anticoagulants: Secondary | ICD-10-CM | POA: Diagnosis not present

## 2019-03-13 DIAGNOSIS — E785 Hyperlipidemia, unspecified: Secondary | ICD-10-CM | POA: Diagnosis not present

## 2019-03-13 DIAGNOSIS — Z96641 Presence of right artificial hip joint: Secondary | ICD-10-CM | POA: Diagnosis not present

## 2019-03-13 DIAGNOSIS — F329 Major depressive disorder, single episode, unspecified: Secondary | ICD-10-CM | POA: Diagnosis not present

## 2019-03-13 DIAGNOSIS — Z974 Presence of external hearing-aid: Secondary | ICD-10-CM | POA: Diagnosis not present

## 2019-03-13 DIAGNOSIS — I447 Left bundle-branch block, unspecified: Secondary | ICD-10-CM | POA: Diagnosis not present

## 2019-03-13 DIAGNOSIS — H6980 Other specified disorders of Eustachian tube, unspecified ear: Secondary | ICD-10-CM | POA: Diagnosis not present

## 2019-03-16 DIAGNOSIS — I447 Left bundle-branch block, unspecified: Secondary | ICD-10-CM | POA: Diagnosis not present

## 2019-03-16 DIAGNOSIS — M810 Age-related osteoporosis without current pathological fracture: Secondary | ICD-10-CM | POA: Diagnosis not present

## 2019-03-16 DIAGNOSIS — F329 Major depressive disorder, single episode, unspecified: Secondary | ICD-10-CM | POA: Diagnosis not present

## 2019-03-16 DIAGNOSIS — H6980 Other specified disorders of Eustachian tube, unspecified ear: Secondary | ICD-10-CM | POA: Diagnosis not present

## 2019-03-16 DIAGNOSIS — D125 Benign neoplasm of sigmoid colon: Secondary | ICD-10-CM | POA: Diagnosis not present

## 2019-03-16 DIAGNOSIS — Z471 Aftercare following joint replacement surgery: Secondary | ICD-10-CM | POA: Diagnosis not present

## 2019-03-17 ENCOUNTER — Other Ambulatory Visit: Payer: Self-pay | Admitting: *Deleted

## 2019-03-17 NOTE — Patient Outreach (Signed)
[facility]) Care Management  03/17/2019  Angelica Newton 02-08-1951 [id number]   RED ON [place] Day # 4 Date: 03/16/2019 Red Alert Reason: No transportation to follow up appointment   Outreach attempt # 1, successful.  Call placed to member as she was recently discharged from hospital after hip surgery due to osteoarthritis.  Identity verified.  This care manager introduced self and stated purpose of call.  [facility] care management services explained.  She state she is doing very well, report the answer was recorded inaccurately as she does have support from family and will have transportation to her follow up visits.  Denies the need for any care management services at this time but she is open to receiving this care manager's contact information for future reference.   Plan: RN CM will send successful outreach letter with this care manager's contact information and close case.  Valente David, [place], MSN [place] [phone]

## 2019-03-18 DIAGNOSIS — D125 Benign neoplasm of sigmoid colon: Secondary | ICD-10-CM | POA: Diagnosis not present

## 2019-03-18 DIAGNOSIS — M810 Age-related osteoporosis without current pathological fracture: Secondary | ICD-10-CM | POA: Diagnosis not present

## 2019-03-18 DIAGNOSIS — I447 Left bundle-branch block, unspecified: Secondary | ICD-10-CM | POA: Diagnosis not present

## 2019-03-18 DIAGNOSIS — F329 Major depressive disorder, single episode, unspecified: Secondary | ICD-10-CM | POA: Diagnosis not present

## 2019-03-18 DIAGNOSIS — H6980 Other specified disorders of Eustachian tube, unspecified ear: Secondary | ICD-10-CM | POA: Diagnosis not present

## 2019-03-18 DIAGNOSIS — Z471 Aftercare following joint replacement surgery: Secondary | ICD-10-CM | POA: Diagnosis not present

## 2019-03-21 DIAGNOSIS — D125 Benign neoplasm of sigmoid colon: Secondary | ICD-10-CM | POA: Diagnosis not present

## 2019-03-21 DIAGNOSIS — H6980 Other specified disorders of Eustachian tube, unspecified ear: Secondary | ICD-10-CM | POA: Diagnosis not present

## 2019-03-21 DIAGNOSIS — F329 Major depressive disorder, single episode, unspecified: Secondary | ICD-10-CM | POA: Diagnosis not present

## 2019-03-21 DIAGNOSIS — Z471 Aftercare following joint replacement surgery: Secondary | ICD-10-CM | POA: Diagnosis not present

## 2019-03-21 DIAGNOSIS — M810 Age-related osteoporosis without current pathological fracture: Secondary | ICD-10-CM | POA: Diagnosis not present

## 2019-03-21 DIAGNOSIS — I447 Left bundle-branch block, unspecified: Secondary | ICD-10-CM | POA: Diagnosis not present

## 2019-03-23 DIAGNOSIS — H6980 Other specified disorders of Eustachian tube, unspecified ear: Secondary | ICD-10-CM | POA: Diagnosis not present

## 2019-03-23 DIAGNOSIS — I447 Left bundle-branch block, unspecified: Secondary | ICD-10-CM | POA: Diagnosis not present

## 2019-03-23 DIAGNOSIS — Z471 Aftercare following joint replacement surgery: Secondary | ICD-10-CM | POA: Diagnosis not present

## 2019-03-23 DIAGNOSIS — M810 Age-related osteoporosis without current pathological fracture: Secondary | ICD-10-CM | POA: Diagnosis not present

## 2019-03-23 DIAGNOSIS — F329 Major depressive disorder, single episode, unspecified: Secondary | ICD-10-CM | POA: Diagnosis not present

## 2019-03-23 DIAGNOSIS — D125 Benign neoplasm of sigmoid colon: Secondary | ICD-10-CM | POA: Diagnosis not present

## 2019-03-24 DIAGNOSIS — I447 Left bundle-branch block, unspecified: Secondary | ICD-10-CM | POA: Diagnosis not present

## 2019-03-24 DIAGNOSIS — F329 Major depressive disorder, single episode, unspecified: Secondary | ICD-10-CM | POA: Diagnosis not present

## 2019-03-24 DIAGNOSIS — M810 Age-related osteoporosis without current pathological fracture: Secondary | ICD-10-CM | POA: Diagnosis not present

## 2019-03-24 DIAGNOSIS — D125 Benign neoplasm of sigmoid colon: Secondary | ICD-10-CM | POA: Diagnosis not present

## 2019-03-24 DIAGNOSIS — H6980 Other specified disorders of Eustachian tube, unspecified ear: Secondary | ICD-10-CM | POA: Diagnosis not present

## 2019-03-24 DIAGNOSIS — Z471 Aftercare following joint replacement surgery: Secondary | ICD-10-CM | POA: Diagnosis not present

## 2019-03-29 DIAGNOSIS — H6980 Other specified disorders of Eustachian tube, unspecified ear: Secondary | ICD-10-CM | POA: Diagnosis not present

## 2019-03-29 DIAGNOSIS — M810 Age-related osteoporosis without current pathological fracture: Secondary | ICD-10-CM | POA: Diagnosis not present

## 2019-03-29 DIAGNOSIS — Z471 Aftercare following joint replacement surgery: Secondary | ICD-10-CM | POA: Diagnosis not present

## 2019-03-29 DIAGNOSIS — I447 Left bundle-branch block, unspecified: Secondary | ICD-10-CM | POA: Diagnosis not present

## 2019-03-29 DIAGNOSIS — D125 Benign neoplasm of sigmoid colon: Secondary | ICD-10-CM | POA: Diagnosis not present

## 2019-03-29 DIAGNOSIS — F329 Major depressive disorder, single episode, unspecified: Secondary | ICD-10-CM | POA: Diagnosis not present

## 2019-03-31 DIAGNOSIS — I447 Left bundle-branch block, unspecified: Secondary | ICD-10-CM | POA: Diagnosis not present

## 2019-03-31 DIAGNOSIS — Z471 Aftercare following joint replacement surgery: Secondary | ICD-10-CM | POA: Diagnosis not present

## 2019-03-31 DIAGNOSIS — F329 Major depressive disorder, single episode, unspecified: Secondary | ICD-10-CM | POA: Diagnosis not present

## 2019-03-31 DIAGNOSIS — D125 Benign neoplasm of sigmoid colon: Secondary | ICD-10-CM | POA: Diagnosis not present

## 2019-03-31 DIAGNOSIS — H6980 Other specified disorders of Eustachian tube, unspecified ear: Secondary | ICD-10-CM | POA: Diagnosis not present

## 2019-03-31 DIAGNOSIS — M810 Age-related osteoporosis without current pathological fracture: Secondary | ICD-10-CM | POA: Diagnosis not present

## 2019-04-06 DIAGNOSIS — F329 Major depressive disorder, single episode, unspecified: Secondary | ICD-10-CM | POA: Diagnosis not present

## 2019-04-06 DIAGNOSIS — Z471 Aftercare following joint replacement surgery: Secondary | ICD-10-CM | POA: Diagnosis not present

## 2019-04-06 DIAGNOSIS — D125 Benign neoplasm of sigmoid colon: Secondary | ICD-10-CM | POA: Diagnosis not present

## 2019-04-06 DIAGNOSIS — I447 Left bundle-branch block, unspecified: Secondary | ICD-10-CM | POA: Diagnosis not present

## 2019-04-06 DIAGNOSIS — M810 Age-related osteoporosis without current pathological fracture: Secondary | ICD-10-CM | POA: Diagnosis not present

## 2019-04-06 DIAGNOSIS — H6980 Other specified disorders of Eustachian tube, unspecified ear: Secondary | ICD-10-CM | POA: Diagnosis not present

## 2019-04-08 DIAGNOSIS — F329 Major depressive disorder, single episode, unspecified: Secondary | ICD-10-CM | POA: Diagnosis not present

## 2019-04-08 DIAGNOSIS — I447 Left bundle-branch block, unspecified: Secondary | ICD-10-CM | POA: Diagnosis not present

## 2019-04-08 DIAGNOSIS — H6980 Other specified disorders of Eustachian tube, unspecified ear: Secondary | ICD-10-CM | POA: Diagnosis not present

## 2019-04-08 DIAGNOSIS — M810 Age-related osteoporosis without current pathological fracture: Secondary | ICD-10-CM | POA: Diagnosis not present

## 2019-04-08 DIAGNOSIS — Z471 Aftercare following joint replacement surgery: Secondary | ICD-10-CM | POA: Diagnosis not present

## 2019-04-08 DIAGNOSIS — D125 Benign neoplasm of sigmoid colon: Secondary | ICD-10-CM | POA: Diagnosis not present

## 2019-04-18 DIAGNOSIS — Z471 Aftercare following joint replacement surgery: Secondary | ICD-10-CM | POA: Diagnosis not present

## 2019-04-21 DIAGNOSIS — M1611 Unilateral primary osteoarthritis, right hip: Secondary | ICD-10-CM | POA: Diagnosis not present

## 2019-05-03 ENCOUNTER — Other Ambulatory Visit: Payer: Self-pay

## 2019-05-13 ENCOUNTER — Encounter: Payer: Medicare Other | Admitting: Family Medicine

## 2019-06-15 ENCOUNTER — Other Ambulatory Visit: Payer: Self-pay | Admitting: Family Medicine

## 2019-06-15 MED ORDER — EZETIMIBE 10 MG PO TABS: 10.0000 mg | ORAL_TABLET | Freq: Every day | ORAL | 3 refills | Status: DC

## 2019-06-15 NOTE — Telephone Encounter (Signed)
Pt is requesting a refill on her ezetimibe (ZETIA) 10 MG tablet, pt only has about 10 days left.

## 2019-08-08 ENCOUNTER — Ambulatory Visit (INDEPENDENT_AMBULATORY_CARE_PROVIDER_SITE_OTHER): Payer: Medicare Other | Admitting: Family Medicine

## 2019-08-08 ENCOUNTER — Other Ambulatory Visit: Payer: Self-pay

## 2019-08-08 ENCOUNTER — Encounter: Payer: Self-pay | Admitting: Family Medicine

## 2019-08-08 VITALS — BP 120/70 | HR 67 | Temp 96.9°F | Ht 68.0 in | Wt 148.2 lb

## 2019-08-08 DIAGNOSIS — E785 Hyperlipidemia, unspecified: Secondary | ICD-10-CM | POA: Diagnosis not present

## 2019-08-08 DIAGNOSIS — Z96641 Presence of right artificial hip joint: Secondary | ICD-10-CM | POA: Diagnosis not present

## 2019-08-08 DIAGNOSIS — Z1231 Encounter for screening mammogram for malignant neoplasm of breast: Secondary | ICD-10-CM | POA: Insufficient documentation

## 2019-08-08 LAB — COMPREHENSIVE METABOLIC PANEL
ALT: 21 U/L (ref 0–35)
AST: 25 U/L (ref 0–37)
Albumin: 4.1 g/dL (ref 3.5–5.2)
Alkaline Phosphatase: 52 U/L (ref 39–117)
BUN: 15 mg/dL (ref 6–23)
CO2: 27 mEq/L (ref 19–32)
Calcium: 9.6 mg/dL (ref 8.4–10.5)
Chloride: 103 mEq/L (ref 96–112)
Creatinine, Ser: 0.93 mg/dL (ref 0.40–1.20)
GFR: 60.06 mL/min (ref >60.00)
Glucose, Bld: 98 mg/dL (ref 70–99)
Potassium: 4.3 mEq/L (ref 3.5–5.1)
Sodium: 138 mEq/L (ref 135–145)
Total Bilirubin: 1 mg/dL (ref 0.2–1.2)
Total Protein: 7.4 g/dL (ref 6.0–8.3)

## 2019-08-08 LAB — LIPID PANEL
Cholesterol: 211 mg/dL — ABNORMAL HIGH (ref 0–200)
HDL: 64.2 mg/dL (ref >39.00)
LDL Cholesterol: 123 mg/dL — ABNORMAL HIGH (ref 0–99)
NonHDL: 146.48
Total CHOL/HDL Ratio: 3
Triglycerides: 117 mg/dL (ref 0.0–149.0)
VLDL: 23.4 mg/dL (ref 0.0–40.0)

## 2019-08-08 NOTE — Assessment & Plan Note (Signed)
Doing well.  She will continue physical activity.

## 2019-08-08 NOTE — Progress Notes (Signed)
Tommi Rumps, MD Phone: [phone]  Angelica Newton is a 69 y.o. female who presents today for follow-up.  Hyperlipidemia: Taking Zetia.  No chest pain, shortness of breath, abdominal pain, or myalgias.  Status post hip replacement: Patient notes this worked quite well.  No significant discomfort from this.  She is no longer on any pain medications.  Patient has been walking for exercise and doing spin classes 2 times a week.  She is eating too many crackers and chips.  She does get good fruit vegetable intake. Mammogram is due. Colonoscopy up-to-date. Breast cancer history in her paternal grandmother.  She has a family history of ovarian cancer in a maternal aunt.  No first-degree relatives with colon cancer. Due for Shingrix and Pneumovax though these have been deferred given the patient is going to have her next COVID-19 vaccine in the next week or so. She drinks about 1 glass of wine a day though occasionally drinks less.   Social History   Tobacco Use  Smoking Status Former Smoker  . Years: 5.00  . Types: Cigarettes  . Quit date: 06/09/2010  . Years since quitting: 9.1  Smokeless Tobacco Never Used  Tobacco Comment   only smoked 1 cig per day     ROS see history of present illness  Objective  Physical Exam Vitals:   08/08/19 0903  BP: 120/70  Pulse: 67  Temp: (!) 96.9 F (36.1 C)  SpO2: 99%    BP Readings from Last 3 Encounters:  08/08/19 120/70  03/11/19 125/61  03/01/19 122/78   Wt Readings from Last 3 Encounters:  08/08/19 148 lb 3.2 oz (67.2 kg)  03/09/19 149 lb 14.6 oz (68 kg)  03/01/19 149 lb 14.6 oz (68 kg)    Physical Exam Constitutional:      General: She is not in acute distress.    Appearance: She is not diaphoretic.  Cardiovascular:     Rate and Rhythm: Normal rate and regular rhythm.     Heart sounds: Normal heart sounds.  Pulmonary:     Effort: Pulmonary effort is normal.     Breath sounds: Normal breath sounds.    Musculoskeletal:     Right lower leg: No edema.     Left lower leg: No edema.  Skin:    General: Skin is warm and dry.  Neurological:     Mental Status: She is alert.      Assessment/Plan: Please see individual problem list.  Hyperlipidemia Continue Zetia.  Check labs.  H/O total hip arthroplasty Doing well.  She will continue physical activity.  Encounter for screening mammogram for malignant neoplasm of breast Mammogram ordered.  Patient will call to schedule.  Does not appear to have any high risk family history for breast cancer or ovarian cancer as the breast cancer occurred in her paternal father's mother and the ovarian cancer occurred in her maternal aunt.  I do not believe there is a reason to see a genetic counselor at this time.  Health maintenance: Encouraged to get pneumonia vaccine and Shingrix when she is able to after she completes her COVID-19 series.  Orders Placed This Encounter  Procedures  . MM 3D SCREEN BREAST BILATERAL    Standing Status:   Future    Standing Expiration Date:   10/07/2020    Order Specific Question:   Reason for Exam (SYMPTOM  OR DIAGNOSIS REQUIRED)    Answer:   breast cancer screening    Order Specific Question:   Preferred imaging  location?    Answer:   [facility]  . Comp Met (CMET)  . Lipid panel    No orders of the defined types were placed in this encounter.   This visit occurred during the SARS-CoV-2 public health emergency.  Safety protocols were in place, including screening questions prior to the visit, additional usage of staff PPE, and extensive cleaning of exam room while observing appropriate contact time as indicated for disinfecting solutions.    Tommi Rumps, MD [place]

## 2019-08-08 NOTE — Patient Instructions (Signed)
Nice to see you. We will check labs today.  Please call to schedule your mammogram. Please continue with exercise and monitoring your diet.

## 2019-08-08 NOTE — Assessment & Plan Note (Signed)
Mammogram ordered.  Patient will call to schedule.  Does not appear to have any high risk family history for breast cancer or ovarian cancer as the breast cancer occurred in her paternal father's mother and the ovarian cancer occurred in her maternal aunt.  I do not believe there is a reason to see a genetic counselor at this time.

## 2019-08-08 NOTE — Assessment & Plan Note (Signed)
Continue Zetia.  Check labs.

## 2019-10-03 ENCOUNTER — Ambulatory Visit
Admission: RE | Admit: 2019-10-03 | Discharge: 2019-10-03 | Disposition: A | Discharge status: Home / Self Care | Payer: Medicare Other | Source: Ambulatory Visit | Attending: Family Medicine | Admitting: Family Medicine

## 2019-10-03 DIAGNOSIS — Z1231 Encounter for screening mammogram for malignant neoplasm of breast: Secondary | ICD-10-CM | POA: Diagnosis not present

## 2019-12-26 ENCOUNTER — Ambulatory Visit (INDEPENDENT_AMBULATORY_CARE_PROVIDER_SITE_OTHER): Payer: Medicare Other

## 2019-12-26 VITALS — Ht 68.0 in | Wt 148.0 lb

## 2019-12-26 DIAGNOSIS — Z0000 Encounter for general adult medical examination without abnormal findings: Secondary | ICD-10-CM

## 2019-12-26 NOTE — Progress Notes (Signed)
Subjective:   Lynix Bonine is a 69 y.o. female who presents for Medicare Annual (Subsequent) preventive examination.  Review of Systems    No ROS.  Medicare Wellness Virtual Visit.   Cardiac Risk Factors include: advanced age (>80men, >71 women)     Objective:    Today's Vitals   12/26/19 0930  Weight: 148 lb (67.1 kg)  Height: 5\' 8"  (1.727 m)   Body mass index is 22.5 kg/m.  Advanced Directives 12/26/2019 03/09/2019 03/09/2019 12/24/2018 09/17/2015  Does Patient Have a Medical Advance Directive? Yes Yes Yes Yes No  Type of Paramedic of [place];Living will Living will;Healthcare Power of [place];Living will [place];Living will -  Does patient want to make changes to medical advance directive? No - Patient declined No - Patient declined - No - Patient declined -  Copy of [place] in Chart? Yes - validated most recent copy scanned in chart (See row information) - Yes - validated most recent copy scanned in chart (See row information) No - copy requested -  Would patient like information on creating a medical advance directive? - - - - No - patient declined information    Current Medications (verified) Outpatient Encounter Medications as of 12/26/2019  Medication Sig  . Cholecalciferol (VITAMIN D3) 50 MCG (2000 UT) TABS Take 2,000 Units by mouth every evening.  . ezetimibe (ZETIA) 10 MG tablet Take 1 tablet (10 mg total) by mouth daily.  . Misc Natural Products (LUTEIN 20 PO) Take 20 mg by mouth every evening.  . Multiple Minerals-Vitamins (CALCIUM-MAGNESIUM-ZINC-D3 PO) Take 2 tablets by mouth every evening.  . Multiple Vitamin (MULTIVITAMIN WITH MINERALS) TABS tablet Take 1 tablet by mouth every evening. Women's One A Day   No facility-administered encounter medications on file as of 12/26/2019.    Allergies (verified) Lipitor [atorvastatin], Tetracyclines & related, Zocor  [simvastatin], and Mango butter   History: Past Medical History:  Diagnosis Date  . Arthritis    Right hip  . Chicken pox   . Depression   . Motion sickness    in past  . PONV (postoperative nausea and vomiting)   . UTI (urinary tract infection)   . Wears hearing aid    bilateral   Past Surgical History:  Procedure Laterality Date  . AUGMENTATION MAMMAPLASTY Bilateral 2007   mastopexy/breast lift  . BREAST SURGERY  2005   Breast Lift  . COLONOSCOPY WITH PROPOFOL N/A 09/17/2015   Procedure: COLONOSCOPY WITH PROPOFOL;  Surgeon: Lucilla Lame, MD;  Location: [facility];  Service: Endoscopy;  Laterality: N/A;  . ORIF ULNAR FRACTURE  1987   Broken Ulna/plates and screws  . POLYPECTOMY  09/17/2015   Procedure: POLYPECTOMY INTESTINAL;  Surgeon: Lucilla Lame, MD;  Location: [facility];  Service: Endoscopy;;  Sigmoid colon polyp  . TOTAL HIP ARTHROPLASTY Right 03/09/2019   Procedure: TOTAL HIP ARTHROPLASTY;  Surgeon: Dereck Leep, MD;  Location: [facility];  Service: Orthopedics;  Laterality: Right;  . TUBAL LIGATION     Family History  Problem Relation Age of Onset  . Hyperlipidemia Mother   . Stroke Mother   . Diabetes Mother   . Hyperlipidemia Father   . Stroke Father   . Hypertension Father   . Kidney failure Father   . Diabetes Father   . Cancer Paternal Grandmother        Breast  . Cancer Brother  squamous cell carcinoma  . Diabetes Maternal Grandmother   . Breast cancer Paternal Aunt    Social History   Socioeconomic History  . Marital status: Single    Spouse name: Not on file  . Number of children: Not on file  . Years of education: Not on file  . Highest education level: Not on file  Occupational History  . Not on file  Tobacco Use  . Smoking status: Former Smoker    Years: 5.00    Types: Cigarettes    Quit date: 06/09/2010    Years since quitting: 9.5  . Smokeless tobacco: Never Used  . Tobacco comment: only smoked 1 cig per day    Vaping Use  . Vaping Use: Never used  Substance and Sexual Activity  . Alcohol use: Yes    Alcohol/week: 10.0 standard drinks    Types: 10 Glasses of wine per week    Comment: occ  . Drug use: No  . Sexual activity: Yes    Partners: Male  Other Topics Concern  . Not on file  Social History Narrative   Retired, but volunteers    Lives by herself   Children- 2 (4 and 65 yo)    Pets: None    Caffeine: 2 cups in the morning, diet coke rarely    Right handed    Enjoys bike riding, cooking, traveling    Social Determinants of Radio broadcast assistant Strain:   . Difficulty of Paying Living Expenses:   Food Insecurity:   . Worried About Charity fundraiser in the Last Year:   . Arboriculturist in the Last Year:   Transportation Needs:   . Film/video editor (Medical):   Marland Kitchen Lack of Transportation (Non-Medical):   Physical Activity:   . Days of Exercise per Week:   . Minutes of Exercise per Session:   Stress:   . Feeling of Stress :   Social Connections: Unknown  . Frequency of Communication with Friends and Family: More than three times a week  . Frequency of Social Gatherings with Friends and Family: More than three times a week  . Attends Religious Services: Not on file  . Active Member of Clubs or Organizations: Yes  . Attends Archivist Meetings: More than 4 times per year  . Marital Status: Not on file    Tobacco Counseling Counseling given: Not Answered Comment: only smoked 1 cig per day   Clinical Intake:  Pre-visit preparation completed: Yes        Diabetes: No  How often do you need to have someone help you when you read instructions, pamphlets, or other written materials from your doctor or pharmacy?: 1 - Never  Interpreter Needed?: No     Activities of Daily Living In your present state of health, do you have any difficulty performing the following activities: 12/26/2019 03/09/2019  Hearing? N -  Vision? N -  Difficulty  concentrating or making decisions? N -  Walking or climbing stairs? N -  Dressing or bathing? N -  Doing errands, shopping? N N  Preparing Food and eating ? N -  Using the Toilet? N -  In the past six months, have you accidently leaked urine? N -  Do you have problems with loss of bowel control? N -  Managing your Medications? N -  Managing your Finances? N -  Housekeeping or managing your Housekeeping? N -  Some recent data might be hidden  Patient Care Team: Leone Haven, MD as PCP - General (Family Medicine)  Indicate any recent Medical Services you may have received from other than [facility] providers in the past year (date may be approximate).     Assessment:   This is a routine wellness examination for [place].  I connected with [place] today by telephone and verified that I am speaking with the correct person using two identifiers. Location patient: home Location provider: work Persons participating in the virtual visit: patient, Marine scientist.    I discussed the limitations, risks, security and privacy concerns of performing an evaluation and management service by telephone and the availability of in person appointments. The patient expressed understanding and verbally consented to this telephonic visit.    Interactive audio and video telecommunications were attempted between this provider and patient, however failed, due to patient having technical difficulties OR patient did not have access to video capability.  We continued and completed visit with audio only.  Some vital signs may be absent or patient reported.   Hearing/Vision screen  Hearing Screening   125Hz  250Hz  500Hz  1000Hz  2000Hz  3000Hz  4000Hz  6000Hz  8000Hz   Right ear:           Left ear:           Comments: Hearing aids, bilateral  Vision Screening Comments: Followed by [facility] Wears corrective lenses Visual acuity not assessed, virtual visit.  They have seen their ophthalmologist in the last 12  months.    Dietary issues and exercise activities discussed: Current Exercise Habits: Home exercise routine, Type of exercise: calisthenics;walking, Time (Minutes): 60, Frequency (Times/Week): 3, Weekly Exercise (Minutes/Week): 180, Intensity: Moderate  Healthy diet Good water intake Green tea  Goals      Patient Stated   .  Increase physical activity (pt-stated)      Use the gym 3x weekly      Depression Screen PHQ 2/9 Scores 12/26/2019 08/08/2019 12/24/2018 05/10/2018  PHQ - 2 Score 0 0 0 0    Fall Risk Fall Risk  12/26/2019 08/08/2019 05/03/2019 12/24/2018 05/10/2018  Falls in the past year? 0 0 0 0 0  Comment - - Emmi Telephone Survey: data to providers prior to load - -  Number falls in past yr: 0 0 - - 0  Injury with Fall? - - - - 0  Follow up Falls evaluation completed Falls evaluation completed - - -   Handrails in use when climbing stairs? Yes  Home free of loose throw rugs in walkways, pet beds, electrical cords, etc? Yes  Adequate lighting in your home to reduce risk of falls? Yes   ASSISTIVE DEVICES UTILIZED TO PREVENT FALLS: Use of a cane, walker or w/c? No  Grab bars in the bathroom? No  Shower chair or bench in shower? No  Elevated toilet seat or a handicapped toilet? No   TIMED UP AND GO:  Was the test performed? No . Virtual visit.  Cognitive Function: Patient is alert and oriented x3.  Enjoys crossword puzzles, card games and online brain challenging activities for brain health.      6CIT Screen 12/26/2019 12/24/2018  What Year? 0 points 0 points  What month? 0 points 0 points  What time? 0 points 0 points  Count back from 20 - 0 points  Months in reverse - 0 points  Repeat phrase - 0 points  Total Score - 0   Immunizations Immunization History  Administered Date(s) Administered  . Influenza Split 07/03/2014  . Influenza, High  Dose Seasonal PF 05/10/2018  . Influenza,inj,Quad PF,6+ Mos 05/10/2015  . PFIZER SARS-COV-2 Vaccination 07/27/2019,  08/17/2019  . Pneumococcal Conjugate-13 05/08/2017   Health Maintenance Health Maintenance  Topic Date Due  . PNA vac Low Risk Adult (2 of 2 - PPSV23) 05/08/2018  . INFLUENZA VACCINE  01/08/2020  . COLONOSCOPY  09/16/2020  . MAMMOGRAM  10/02/2021  . TETANUS/TDAP  06/10/2023  . DEXA SCAN  Completed  . COVID-19 Vaccine  Completed  . Hepatitis C Screening  Completed   Pneumococcal 23 - Due, Education has been provided regarding the importance of this vaccine. Advised may receive this vaccine at local pharmacy or Health Dept. Aware to provide a copy of the vaccination record if obtained from local pharmacy or Health Dept. Verbalized acceptance and understanding.  Health Maintenance Due  Topic Date Due  . PNA vac Low Risk Adult (2 of 2 - PPSV23) 05/08/2018   Dental Screening: Recommended annual dental exams for proper oral hygiene  Community Resource Referral / Chronic Care Management: CRR required this visit?  No   CCM required this visit?  No     Plan:    Keep all routine maintenance appointments.   Follow up 08/08/20 @ 8:30  I have personally reviewed and noted the following in the patient's chart:   . Medical and social history . Use of alcohol, tobacco or illicit drugs  . Current medications and supplements . Functional ability and status . Nutritional status . Physical activity . Advanced directives . List of other physicians . Hospitalizations, surgeries, and ER visits in previous 12 months . Vitals . Screenings to include cognitive, depression, and falls . Referrals and appointments  In addition, I have reviewed and discussed with patient certain preventive protocols, quality metrics, and best practice recommendations. A written personalized care plan for preventive services as well as general preventive health recommendations were provided to patient.     Varney Biles, LPN   [id number]

## 2019-12-26 NOTE — Patient Instructions (Addendum)
Angelica Newton , Thank you for taking time to come for your Medicare Wellness Visit. I appreciate your ongoing commitment to your health goals. Please review the following plan we discussed and let me know if I can assist you in the future.   These are the goals we discussed: Goals      Patient Stated   .  Increase physical activity (pt-stated)      Use the gym 3x weekly       This is a list of the screening recommended for you and due dates:  Health Maintenance  Topic Date Due  . Pneumonia vaccines (2 of 2 - PPSV23) 05/08/2018  . Flu Shot  01/08/2020  . Colon Cancer Screening  09/16/2020  . Mammogram  10/02/2021  . Tetanus Vaccine  06/10/2023  . DEXA scan (bone density measurement)  Completed  . COVID-19 Vaccine  Completed  .  Hepatitis C: One time screening is recommended by Center for Disease Control  (CDC) for  adults born from [birth year] through 1964.   Completed    Immunizations Immunization History  Administered Date(s) Administered  . Influenza Split 07/03/2014  . Influenza, High Dose Seasonal PF 05/10/2018  . Influenza,inj,Quad PF,6+ Mos 05/10/2015  . PFIZER SARS-COV-2 Vaccination 07/27/2019, 08/17/2019  . Pneumococcal Conjugate-13 05/08/2017   Keep all routine maintenance appointments.   Follow up 08/08/20 @ 8:30  Advanced directives: yes on file  Conditions/risks identified: none new  Follow up in one year for your annual wellness visit    Preventive Care 65 Years and Older, Female Preventive care refers to lifestyle choices and visits with your health care provider that can promote health and wellness. What does preventive care include?  A yearly physical exam. This is also called an annual well check.  Dental exams once or twice a year.  Routine eye exams. Ask your health care provider how often you should have your eyes checked.  Personal lifestyle choices, including:  Daily care of your teeth and gums.  Regular physical activity.  Eating a healthy  diet.  Avoiding tobacco and drug use.  Limiting alcohol use.  Practicing safe sex.  Taking low-dose aspirin every day.  Taking vitamin and mineral supplements as recommended by your health care provider. What happens during an annual well check? The services and screenings done by your health care provider during your annual well check will depend on your age, overall health, lifestyle risk factors, and family history of disease. Counseling  Your health care provider may ask you questions about your:  Alcohol use.  Tobacco use.  Drug use.  Emotional well-being.  Home and relationship well-being.  Sexual activity.  Eating habits.  History of falls.  Memory and ability to understand (cognition).  Work and work Statistician.  Reproductive health. Screening  You may have the following tests or measurements:  Height, weight, and BMI.  Blood pressure.  Lipid and cholesterol levels. These may be checked every 5 years, or more frequently if you are over 54 years old.  Skin check.  Lung cancer screening. You may have this screening every year starting at age 35 if you have a 30-pack-year history of smoking and currently smoke or have quit within the past 15 years.  Fecal occult blood test (FOBT) of the stool. You may have this test every year starting at age 94.  Flexible sigmoidoscopy or colonoscopy. You may have a sigmoidoscopy every 5 years or a colonoscopy every 10 years starting at age 31.  Hepatitis C blood  test.  Hepatitis B blood test.  Sexually transmitted disease (STD) testing.  Diabetes screening. This is done by checking your blood sugar (glucose) after you have not eaten for a while (fasting). You may have this done every 1-3 years.  Bone density scan. This is done to screen for osteoporosis. You may have this done starting at age 19.  Mammogram. This may be done every 1-2 years. Talk to your health care provider about how often you should have  regular mammograms. Talk with your health care provider about your test results, treatment options, and if necessary, the need for more tests. Vaccines  Your health care provider may recommend certain vaccines, such as:  Influenza vaccine. This is recommended every year.  Tetanus, diphtheria, and acellular pertussis (Tdap, Td) vaccine. You may need a Td booster every 10 years.  Zoster vaccine. You may need this after age 49.  Pneumococcal 13-valent conjugate (PCV13) vaccine. One dose is recommended after age 56.  Pneumococcal polysaccharide (PPSV23) vaccine. One dose is recommended after age 58. Talk to your health care provider about which screenings and vaccines you need and how often you need them. This information is not intended to replace advice given to you by your health care provider. Make sure you discuss any questions you have with your health care provider. Document Released: 06/22/2015 Document Revised: 02/13/2016 Document Reviewed: 03/27/2015 Elsevier Interactive Patient Education  2017 [place] Prevention in the Home Falls can cause injuries. They can happen to people of all ages. There are many things you can do to make your home safe and to help prevent falls. What can I do on the outside of my home?  Regularly fix the edges of walkways and driveways and fix any cracks.  Remove anything that might make you trip as you walk through a door, such as a raised step or threshold.  Trim any bushes or trees on the path to your home.  Use bright outdoor lighting.  Clear any walking paths of anything that might make someone trip, such as rocks or tools.  Regularly check to see if handrails are loose or broken. Make sure that both sides of any steps have handrails.  Any raised decks and porches should have guardrails on the edges.  Have any leaves, snow, or ice cleared regularly.  Use sand or salt on walking paths during winter.  Clean up any spills in your  garage right away. This includes oil or grease spills. What can I do in the bathroom?  Use night lights.  Install grab bars by the toilet and in the tub and shower. Do not use towel bars as grab bars.  Use non-skid mats or decals in the tub or shower.  If you need to sit down in the shower, use a plastic, non-slip stool.  Keep the floor dry. Clean up any water that spills on the floor as soon as it happens.  Remove soap buildup in the tub or shower regularly.  Attach bath mats securely with double-sided non-slip rug tape.  Do not have throw rugs and other things on the floor that can make you trip. What can I do in the bedroom?  Use night lights.  Make sure that you have a light by your bed that is easy to reach.  Do not use any sheets or blankets that are too big for your bed. They should not hang down onto the floor.  Have a firm chair that has side arms. You can  use this for support while you get dressed.  Do not have throw rugs and other things on the floor that can make you trip. What can I do in the kitchen?  Clean up any spills right away.  Avoid walking on wet floors.  Keep items that you use a lot in easy-to-reach places.  If you need to reach something above you, use a strong step stool that has a grab bar.  Keep electrical cords out of the way.  Do not use floor polish or wax that makes floors slippery. If you must use wax, use non-skid floor wax.  Do not have throw rugs and other things on the floor that can make you trip. What can I do with my stairs?  Do not leave any items on the stairs.  Make sure that there are handrails on both sides of the stairs and use them. Fix handrails that are broken or loose. Make sure that handrails are as long as the stairways.  Check any carpeting to make sure that it is firmly attached to the stairs. Fix any carpet that is loose or worn.  Avoid having throw rugs at the top or bottom of the stairs. If you do have throw  rugs, attach them to the floor with carpet tape.  Make sure that you have a light switch at the top of the stairs and the bottom of the stairs. If you do not have them, ask someone to add them for you. What else can I do to help prevent falls?  Wear shoes that:  Do not have high heels.  Have rubber bottoms.  Are comfortable and fit you well.  Are closed at the toe. Do not wear sandals.  If you use a stepladder:  Make sure that it is fully opened. Do not climb a closed stepladder.  Make sure that both sides of the stepladder are locked into place.  Ask someone to hold it for you, if possible.  Clearly mark and make sure that you can see:  Any grab bars or handrails.  First and last steps.  Where the edge of each step is.  Use tools that help you move around (mobility aids) if they are needed. These include:  Canes.  Walkers.  Scooters.  Crutches.  Turn on the lights when you go into a dark area. Replace any light bulbs as soon as they burn out.  Set up your furniture so you have a clear path. Avoid moving your furniture around.  If any of your floors are uneven, fix them.  If there are any pets around you, be aware of where they are.  Review your medicines with your doctor. Some medicines can make you feel dizzy. This can increase your chance of falling. Ask your doctor what other things that you can do to help prevent falls. This information is not intended to replace advice given to you by your health care provider. Make sure you discuss any questions you have with your health care provider. Document Released: 03/22/2009 Document Revised: 11/01/2015 Document Reviewed: 06/30/2014 Elsevier Interactive Patient Education  2017 [employer].

## 2020-01-17 DIAGNOSIS — H2513 Age-related nuclear cataract, bilateral: Secondary | ICD-10-CM | POA: Diagnosis not present

## 2020-01-17 DIAGNOSIS — H524 Presbyopia: Secondary | ICD-10-CM | POA: Diagnosis not present

## 2020-01-17 DIAGNOSIS — H353131 Nonexudative age-related macular degeneration, bilateral, early dry stage: Secondary | ICD-10-CM | POA: Diagnosis not present

## 2020-03-12 ENCOUNTER — Other Ambulatory Visit: Payer: Self-pay | Admitting: Family Medicine

## 2020-04-17 DIAGNOSIS — Z23 Encounter for immunization: Secondary | ICD-10-CM | POA: Diagnosis not present

## 2020-08-08 ENCOUNTER — Ambulatory Visit: Payer: Medicare Other | Admitting: Family Medicine

## 2020-08-17 ENCOUNTER — Other Ambulatory Visit: Payer: Self-pay

## 2020-08-21 ENCOUNTER — Encounter: Payer: Self-pay | Admitting: Family Medicine

## 2020-08-21 ENCOUNTER — Other Ambulatory Visit: Payer: Self-pay

## 2020-08-21 ENCOUNTER — Ambulatory Visit (INDEPENDENT_AMBULATORY_CARE_PROVIDER_SITE_OTHER): Payer: Medicare Other

## 2020-08-21 ENCOUNTER — Ambulatory Visit (INDEPENDENT_AMBULATORY_CARE_PROVIDER_SITE_OTHER): Payer: Medicare Other | Admitting: Family Medicine

## 2020-08-21 DIAGNOSIS — N644 Mastodynia: Secondary | ICD-10-CM | POA: Diagnosis not present

## 2020-08-21 DIAGNOSIS — M5412 Radiculopathy, cervical region: Secondary | ICD-10-CM | POA: Insufficient documentation

## 2020-08-21 DIAGNOSIS — E785 Hyperlipidemia, unspecified: Secondary | ICD-10-CM | POA: Diagnosis not present

## 2020-08-21 DIAGNOSIS — G8929 Other chronic pain: Secondary | ICD-10-CM | POA: Diagnosis not present

## 2020-08-21 DIAGNOSIS — R0789 Other chest pain: Secondary | ICD-10-CM | POA: Diagnosis not present

## 2020-08-21 DIAGNOSIS — R1012 Left upper quadrant pain: Secondary | ICD-10-CM

## 2020-08-21 DIAGNOSIS — I447 Left bundle-branch block, unspecified: Secondary | ICD-10-CM

## 2020-08-21 DIAGNOSIS — M47812 Spondylosis without myelopathy or radiculopathy, cervical region: Secondary | ICD-10-CM | POA: Diagnosis not present

## 2020-08-21 DIAGNOSIS — M2578 Osteophyte, vertebrae: Secondary | ICD-10-CM | POA: Diagnosis not present

## 2020-08-21 LAB — COMPREHENSIVE METABOLIC PANEL
ALT: 19 U/L (ref 0–35)
AST: 22 U/L (ref 0–37)
Albumin: 4.2 g/dL (ref 3.5–5.2)
Alkaline Phosphatase: 48 U/L (ref 39–117)
BUN: 16 mg/dL (ref 6–23)
CO2: 26 mEq/L (ref 19–32)
Calcium: 9.6 mg/dL (ref 8.4–10.5)
Chloride: 102 mEq/L (ref 96–112)
Creatinine, Ser: 0.81 mg/dL (ref 0.40–1.20)
GFR: 74.57 mL/min (ref >60.00)
Glucose, Bld: 98 mg/dL (ref 70–99)
Potassium: 4.3 mEq/L (ref 3.5–5.1)
Sodium: 137 mEq/L (ref 135–145)
Total Bilirubin: 0.8 mg/dL (ref 0.2–1.2)
Total Protein: 7.1 g/dL (ref 6.0–8.3)

## 2020-08-21 LAB — LIPID PANEL
Cholesterol: 244 mg/dL — ABNORMAL HIGH (ref 0–200)
HDL: 76.5 mg/dL (ref >39.00)
LDL Cholesterol: 145 mg/dL — ABNORMAL HIGH (ref 0–99)
NonHDL: 167.11
Total CHOL/HDL Ratio: 3
Triglycerides: 110 mg/dL (ref 0.0–149.0)
VLDL: 22 mg/dL (ref 0.0–40.0)

## 2020-08-21 MED ORDER — EZETIMIBE 10 MG PO TABS: ORAL_TABLET | ORAL | 3 refills | Status: DC

## 2020-08-21 NOTE — Assessment & Plan Note (Signed)
Check lipid panel and CMP.  Continue Zetia 10 mg once daily.

## 2020-08-21 NOTE — Assessment & Plan Note (Signed)
Patient with possible breast pain though I suspect this is more likely costochondritis.  We will obtain a mammogram.

## 2020-08-21 NOTE — Assessment & Plan Note (Signed)
Previously saw cardiology.  No chest pain or shortness of breath.

## 2020-08-21 NOTE — Patient Instructions (Signed)
Nice to see you. We will contact you with your x-ray result. Some we will contact you to schedule the ultrasound of your abdomen as well as your mammogram. We will get labs today.

## 2020-08-21 NOTE — Assessment & Plan Note (Signed)
Possibly related to intermittent constipation.  We will obtain an ultrasound to evaluate further.

## 2020-08-21 NOTE — Assessment & Plan Note (Addendum)
Symptoms are most consistent with cervical radiculopathy.  We will get an x-ray of her neck today.  Discussed doing physical therapy after the x-ray returned.  She wondered about chiropractor and I advised against this given that it would involve her neck.  Discussed that there is risk of damage to the vessels in her neck with a chiropractor working on her.

## 2020-08-21 NOTE — Progress Notes (Signed)
Tommi Rumps, MD Phone: [phone]  Angelica Newton is a 70 y.o. female who presents today for f/u.  HYPERLIPIDEMIA Symptoms Chest pain on exertion:  no   Leg claudication:   no Medications: Compliance- taking zetia Right upper quadrant pain- no    Right arm numbness: Patient notes this has been going on intermittently for a few months.  It lasts a short duration.  It typically occurs along the lateral aspect of her shoulder into the dorsal aspect of her right arm.  Occasionally gets electrical shocklike sensation in the same distribution.  Notes has occasional neck pain.  Right breast pain/possible musculoskeletal chest pain: This has been going on intermittently for some time now.  She noticed it first after getting her COVID-19 vaccine.  Feels like somebody is poking her in the right costochondral joint area.  It lasts for a minute or so when it occurs.  No exercise induced chest pain.  No shortness of breath.  Has not noticed any lumps in her breast.  Feels like it is on a bone.  Chronic left upper quadrant pain: This is an ongoing intermittent issue.  She reports having had a prior CT scan though I am unable to find this in the chart.  Typically has a bowel movement daily.  Last BM was 2 days ago.  Had some discomfort last night in the left upper quadrant that has resolved at this time.  No nausea or vomiting.  Left bundle branch block: No chest pain or shortness of breath.    Social History   Tobacco Use  Smoking Status Former Smoker  . Years: 5.00  . Types: Cigarettes  . Quit date: 06/09/2010  . Years since quitting: 10.2  Smokeless Tobacco Never Used  Tobacco Comment   only smoked 1 cig per day    Current Outpatient Medications on File Prior to Visit  Medication Sig Dispense Refill  . Cholecalciferol (VITAMIN D3) 50 MCG (2000 UT) TABS Take 2,000 Units by mouth every evening.    . Misc Natural Products (LUTEIN 20 PO) Take 20 mg by mouth every evening.    .  Multiple Minerals-Vitamins (CALCIUM-MAGNESIUM-ZINC-D3 PO) Take 2 tablets by mouth every evening.    . Multiple Vitamin (MULTIVITAMIN WITH MINERALS) TABS tablet Take 1 tablet by mouth every evening. Women's One A Day     No current facility-administered medications on file prior to visit.     ROS see history of present illness  Objective  Physical Exam Vitals:   08/21/20 1030  BP: 110/80  Pulse: (!) 59  Temp: 98.6 F (37 C)  SpO2: 99%    BP Readings from Last 3 Encounters:  08/21/20 110/80  08/08/19 120/70  03/11/19 125/61   Wt Readings from Last 3 Encounters:  08/21/20 150 lb 12.8 oz (68.4 kg)  12/26/19 148 lb (67.1 kg)  08/08/19 148 lb 3.2 oz (67.2 kg)    Physical Exam Constitutional:      General: She is not in acute distress.    Appearance: She is not diaphoretic.  Cardiovascular:     Rate and Rhythm: Normal rate and regular rhythm.     Heart sounds: Normal heart sounds.  Pulmonary:     Effort: Pulmonary effort is normal.     Breath sounds: Normal breath sounds.  Chest:     Chest wall: No tenderness.     Comments: Fulton Mole, CMA served as chaperone, bilateral breast with chronically inverted nipples that the patient reports have been present for years,  bilateral breast with no skin changes, masses, or tenderness, patient indicates that the area of discomfort is over her right mid costochondral joints, she is able to point to this area with one finger Musculoskeletal:     Comments: No midline neck step-off, no midline neck tenderness, no muscular neck tenderness  Skin:    General: Skin is warm and dry.  Neurological:     Mental Status: She is alert.     Comments: 5/5 strength in bilateral biceps, triceps, grip, quads, hamstrings, plantar and dorsiflexion, sensation to light touch intact in bilateral UE and LE, normal gait, 2+ patellar, biceps, and brachioradialis reflexes      Assessment/Plan: Please see individual problem list.  Problem List Items  Addressed This Visit    Atypical chest pain    Suspect costochondritis given history.  She has benign breast exam.  We will obtain a diagnostic mammogram given that the area does include her breast tissue.      Breast pain    Patient with possible breast pain though I suspect this is more likely costochondritis.  We will obtain a mammogram.      Relevant Orders   MM DIAG BREAST TOMO BILATERAL   US BREAST LTD UNI RIGHT INC AXILLA   Cervical radiculopathy    Symptoms are most consistent with cervical radiculopathy.  We will get an x-ray of her neck today.  Discussed doing physical therapy after the x-ray returned.  She wondered about chiropractor and I advised against this given that it would involve her neck.  Discussed that there is risk of damage to the vessels in her neck with a chiropractor working on her.      Relevant Orders   DG Cervical Spine Complete   Chronic left upper quadrant pain    Possibly related to intermittent constipation.  We will obtain an ultrasound to evaluate further.      Relevant Orders   US Abdomen Complete   Hyperlipidemia    Check lipid panel and CMP.  Continue Zetia 10 mg once daily.      Relevant Medications   ezetimibe (ZETIA) 10 MG tablet   Other Relevant Orders   Lipid panel   Comp Met (CMET)   Left bundle branch block (LBBB)    Previously saw cardiology.  No chest pain or shortness of breath.      Relevant Medications   ezetimibe (ZETIA) 10 MG tablet      This visit occurred during the SARS-CoV-2 public health emergency.  Safety protocols were in place, including screening questions prior to the visit, additional usage of staff PPE, and extensive cleaning of exam room while observing appropriate contact time as indicated for disinfecting solutions.    Tommi Rumps, MD [place]

## 2020-08-21 NOTE — Assessment & Plan Note (Signed)
Suspect costochondritis given history.  She has benign breast exam.  We will obtain a diagnostic mammogram given that the area does include her breast tissue.

## 2020-08-24 ENCOUNTER — Other Ambulatory Visit: Payer: Self-pay | Admitting: Family Medicine

## 2020-08-24 DIAGNOSIS — M5412 Radiculopathy, cervical region: Secondary | ICD-10-CM

## 2020-08-28 ENCOUNTER — Ambulatory Visit
Admission: RE | Admit: 2020-08-28 | Discharge: 2020-08-28 | Disposition: A | Discharge status: Home / Self Care | Payer: Medicare Other | Source: Ambulatory Visit | Attending: Family Medicine | Admitting: Family Medicine

## 2020-08-28 ENCOUNTER — Other Ambulatory Visit: Payer: Self-pay

## 2020-08-28 DIAGNOSIS — N644 Mastodynia: Secondary | ICD-10-CM

## 2020-08-28 DIAGNOSIS — N6489 Other specified disorders of breast: Secondary | ICD-10-CM | POA: Diagnosis not present

## 2020-08-28 DIAGNOSIS — R922 Inconclusive mammogram: Secondary | ICD-10-CM | POA: Diagnosis not present

## 2020-08-31 ENCOUNTER — Ambulatory Visit
Admission: RE | Admit: 2020-08-31 | Discharge: 2020-08-31 | Disposition: A | Discharge status: Home / Self Care | Payer: Medicare Other | Source: Ambulatory Visit | Attending: Family Medicine | Admitting: Family Medicine

## 2020-08-31 ENCOUNTER — Other Ambulatory Visit: Payer: Self-pay

## 2020-08-31 DIAGNOSIS — K760 Fatty (change of) liver, not elsewhere classified: Secondary | ICD-10-CM | POA: Diagnosis not present

## 2020-08-31 DIAGNOSIS — G8929 Other chronic pain: Secondary | ICD-10-CM | POA: Diagnosis present

## 2020-08-31 DIAGNOSIS — R1012 Left upper quadrant pain: Secondary | ICD-10-CM | POA: Diagnosis not present

## 2020-08-31 DIAGNOSIS — K7689 Other specified diseases of liver: Secondary | ICD-10-CM | POA: Diagnosis not present

## 2020-09-04 ENCOUNTER — Other Ambulatory Visit: Payer: Self-pay

## 2020-09-04 ENCOUNTER — Ambulatory Visit: Payer: Medicare Other | Attending: Family Medicine | Admitting: Physical Therapy

## 2020-09-04 DIAGNOSIS — M6281 Muscle weakness (generalized): Secondary | ICD-10-CM | POA: Diagnosis not present

## 2020-09-04 DIAGNOSIS — M5412 Radiculopathy, cervical region: Secondary | ICD-10-CM | POA: Diagnosis not present
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
## 2020-09-05 ENCOUNTER — Encounter: Payer: Self-pay | Admitting: Physical Therapy

## 2020-09-06 ENCOUNTER — Other Ambulatory Visit: Payer: Self-pay

## 2020-09-06 ENCOUNTER — Ambulatory Visit: Payer: Medicare Other | Admitting: Physical Therapy

## 2020-09-06 ENCOUNTER — Encounter: Payer: Self-pay | Admitting: Physical Therapy

## 2020-09-06 DIAGNOSIS — M6281 Muscle weakness (generalized): Secondary | ICD-10-CM

## 2020-09-06 DIAGNOSIS — M5412 Radiculopathy, cervical region: Secondary | ICD-10-CM | POA: Diagnosis not present

## 2020-09-06 NOTE — Therapy (Signed)
[place] [facility] [address], [place], [address] Phone: [phone]   Fax:  [phone]  Physical Therapy Treatment  Patient Details  Name: Angelica Newton MRN: [id number] Date of Birth: 1950-08-11 Referring Provider (PT): Leone Haven, MD   Encounter Date: 09/06/2020   PT End of Session - 09/06/20 1438    Visit Number 2    Number of Visits 24    Date for PT Re-Evaluation 11/27/20    Authorization Type MEDICARE part B reporting period from 09/04/2020    Progress Note Due on Visit 10    PT Start Time 1350    PT Stop Time 1430    PT Time Calculation (min) 40 min    Activity Tolerance Patient tolerated treatment well    Behavior During Therapy [facility] for tasks assessed/performed           Past Medical History:  Diagnosis Date  . Arthritis    Right hip  . Chicken pox   . Depression   . Motion sickness    in past  . PONV (postoperative nausea and vomiting)   . UTI (urinary tract infection)   . Wears hearing aid    bilateral    Past Surgical History:  Procedure Laterality Date  . AUGMENTATION MAMMAPLASTY Bilateral 2007   mastopexy/breast lift  . BREAST SURGERY  2005   Breast Lift  . COLONOSCOPY WITH PROPOFOL N/A 09/17/2015   Procedure: COLONOSCOPY WITH PROPOFOL;  Surgeon: Lucilla Lame, MD;  Location: [place];  Service: Endoscopy;  Laterality: N/A;  . ORIF ULNAR FRACTURE  1987   Broken Ulna/plates and screws  . POLYPECTOMY  09/17/2015   Procedure: POLYPECTOMY INTESTINAL;  Surgeon: Lucilla Lame, MD;  Location: [place];  Service: Endoscopy;;  Sigmoid colon polyp  . TOTAL HIP ARTHROPLASTY Right 03/09/2019   Procedure: TOTAL HIP ARTHROPLASTY;  Surgeon: Dereck Leep, MD;  Location: [facility];  Service: Orthopedics;  Laterality: Right;  . TUBAL LIGATION      There were no vitals filed for this visit.   Subjective Assessment - 09/06/20 1536    Subjective Patient reports  no pain upon arrival and states she feels the exercises from initial eval have been helping with her posture States she wonders if she can do the chin tuck sitting in her car. Would like to come once a week instead of more frequently.    Pertinent History Patient is a 70 y.o. female who presents to outpatient physical therapy with a referral for medical diagnosis cervical radiculopathy. This patient's chief complaints consist of intermittent short shooting pain down R arm and intermittent R UE paresthesia and neck pain leading to the following functional deficits: decreases sense of wellbeing/QOL, sitting at the computer too long, sleeping, concern she will get "hump" at back of neck.   Relevant past medical history and comorbidities include Hx R THA, osteoporosis, L bundle branch block (not currently a problem), lightheadedness associated with left sided upper quadrant pain (currently being investigated), depression, wears hearing aide, breast lift (2005). Former smoker, ORIF L humerus (1987), wears hearing aide.  Patient Denies hx of cancer, stroke, seizures, lung problem, major cardiac events, diabetes, unexplained weight loss, changes in bowel or bladder problems, new onset stumbling or dropping things.    Limitations Other (comment)   Functional Limitations:  decreases sense of wellbeing/QOL, sitting at the computer too long, sleeping, concern she will get "hump" at back of neck.   Diagnostic tests  cervical spine radiograph report 08/21/2020: "IMPRESSION:  Osteoarthritic change at C5-6 and C6-7. No fracture or spondylolisthesis."    Patient Stated Goals "stop the pain in the neck" start a strength training program for upper body, improve posture, prevent hump at back of neck.    Currently in Pain? No/denies               TREATMENT:   Therapeutic exercise:to centralize symptoms and improve ROM, strength, muscular endurance, and activity tolerance required for successful completion of functional  activities.  - standing rows with 10# cable, 1x20 - standing chin tuck progressing to cervical retraction in standing with back to the wall and with towel roll behind back. Many reps until form better, then 2x10 at various times in session to enforce learning motor pattern.  - modified push up at treadmill bar, 1x10-20 - seated/standing overhead press with 5# DB, 2x10-20 both sides - bent over reverse fly with 5# DB, 1x8 (difficult to hold form) - standing B shoulder abduction to 90 degrees with palms down, 5# DB, 1x10 both sides.  - standing bicep curls with 5#DB each side, 1x10-20 each side.  - bent over row on chair, 1x10-20 with 5# DB and holding chin tuck. - standing row with blue theraband, 1x20-30   - Education on HEP including handout   Pt required multimodal cuing for proper technique and to facilitate improved neuromuscular control, strength, range of motion, and functional ability resulting in improved performance and form.  HOME EXERCISE PROGRAM Access Code: [email or web address] URL: [email or web address] Date: 09/06/2020 Prepared by: Rosita Kea  Exercises Seated Cervical Retraction - 1 x daily - 3 sets - 10 reps - 5 second hold hold Doorway Pec Stretch at 90 Degrees Abduction - 1 x daily - 3 sets - 30 seconds hold Row with band/cable - 1 x daily - 2-3 x weekly - 2-3 sets - 10-15 reps Push Up on Table - 2-3 x weekly - 2-3 sets - 10-15 reps Standing Bicep Curls Supinated with Dumbbells - 2-3 x weekly - 2-3 sets - 10-15 reps Shoulder Overhead Press in Flexion with Dumbbells - 2-3 x weekly - 2-3 sets - 10-15 reps Bent over row - 2-3 x weekly - 2-3 sets - 10-15 reps Shoulder Abduction with Dumbbells - Palms Down - 2-3 x weekly - 2-3 sets - 10-15 reps    PT Education - 09/06/20 1537    Education Details Exercise purpose/form. Self management techniques.    Person(s) Educated Patient    Methods Explanation;Demonstration;Tactile cues;Verbal cues;Handout    Comprehension  Verbalized understanding;Returned demonstration;Verbal cues required;Tactile cues required;Need further instruction            PT Short Term Goals - 09/06/20 1544      PT SHORT TERM GOAL #1   Title Be independent with initial home exercise program for self-management of symptoms.    Baseline initial HEP provided at IE (09/04/2020);    Time 2    Period Weeks    Status Achieved    Target Date 09/19/20             PT Long Term Goals - 09/05/20 1416      PT LONG TERM GOAL #1   Title Be independent with a long-term home exercise program for self-management of symptoms.    Baseline Initial HEP provided at IE (09/04/2020);    Time 12    Period Weeks    Status New   TARGET DATE FOR ALL LONG TERM GOALS: 11/27/2020  PT LONG TERM GOAL #2   Title Patient will report no difficulty using a shovel to dig a hole in the dirt due to current condition to improve her ability to perform yardwork.    Baseline a little bit of difficulty (09/04/2020);    Time 12    Period Weeks    Status New      PT LONG TERM GOAL #3   Title Patient will demonstrate suipine deep cervical flexor chin tuck to lift with hold for at least 20 seconds with proper recruitment of SCM and deep cervical neck flexor muscle to demonstrate improved cervical spine muscle performance for improved activity tolerance and posture during functional activities.    Baseline Unable to perform chin tuck without excessive SCM recruitment (09/04/2020);    Time 12    Period Weeks    Status New      PT LONG TERM GOAL #4   Title Patient will report decreased frequency of R UE symptoms to equal or less than once a week to improve her ability to complete funcitonal activities such as sleeping without disruption.    Baseline Unclear but reports she had symptoms yesterday (09/04/2020)    Time 12    Period Weeks    Status New      PT LONG TERM GOAL #5   Title Improve B UE  strength to 4+/5 for improved ability to allow patient to complete  valued functional tasks such as digging with less difficulty.    Baseline 4/5 (09/04/2020);    Time 12    Period Weeks    Status New                 Plan - 09/06/20 1543    Clinical Impression Statement Patient tolerated treatment well overall and reported one instance of momentary paresthesia in bilateral hands while learning cervical retraction exercise at the wall but unclear if it was at protracted or retracted position. Session focused on development of UE strengthening program using 5# and 8# DBs pt has at home and updating postural exercises. Patient demonstrated good form and appeared to understand exercises well. Had the most difficulty with cervical retraction. PT agrees that patient would benefit from 1x/week frequency at this point. Patient would benefit from continued management of limiting condition by skilled physical therapist to address remaining impairments and functional limitations to work towards stated goals and return to PLOF or maximal functional independence.    Personal Factors and Comorbidities Age;Time since onset of injury/illness/exacerbation;Comorbidity 3+;Past/Current Experience    Comorbidities Relevant past medical history and comorbidities include Hx R THA, osteoporosis, L bundle branch block (not currently a problem), lightheadedness associated with left sided upper quadrant pain (currently being investigated), depression, wears hearing aide, breast lift (2005). Former smoker, ORIF L humerus (1987),    Examination-Activity Limitations Sleep;Sit   Functional Limitations:  decreases sense of wellbeing/QOL, sitting at the computer too long, sleeping, using a shovel to dig a hole in the dirt, concern she will get "hump" at back of neck.   Examination-Participation Restrictions Yard Work;Interpersonal Relationship    Stability/Clinical Decision Making Stable/Uncomplicated    Rehab Potential Good    PT Frequency 2x / week    PT Duration 12 weeks    PT  Treatment/Interventions ADLs/Self Care Home Management;Cryotherapy;Electrical Stimulation;Moist Heat;Therapeutic activities;Therapeutic exercise;Neuromuscular re-education;Manual techniques;Passive range of motion;Dry needling;Spinal Manipulations;Joint Manipulations    PT Next Visit Plan postural strengthening/motor control, UE strengthening    PT Home Exercise Plan Medbridge Access Code: [email or web address]  Consulted and Agree with Plan of Care Patient           Patient will benefit from skilled therapeutic intervention in order to improve the following deficits and impairments:  Improper body mechanics,Pain,Decreased coordination,Decreased mobility,Increased muscle spasms,Decreased activity tolerance,Decreased range of motion,Hypomobility,Decreased strength,Decreased endurance,Impaired UE functional use,Impaired flexibility  Visit Diagnosis: Radiculopathy, cervical region  Muscle weakness (generalized)     Problem List Patient Active Problem List   Diagnosis Date Noted  . Cervical radiculopathy 08/21/2020  . Atypical chest pain 08/21/2020  . Breast pain 08/21/2020  . Chronic left upper quadrant pain 08/21/2020  . Encounter for screening mammogram for malignant neoplasm of breast 08/08/2019  . H/O total hip arthroplasty 03/09/2019  . Left bundle branch block (LBBB) 03/02/2019  . Primary osteoarthritis of right hip 01/09/2019  . Seborrheic keratosis 05/10/2018  . Osteoporosis 07/20/2017  . Eustachian tube dysfunction 07/20/2017  . Light headedness 05/08/2017  . Vision changes 05/08/2017  . Rash and nonspecific skin eruption 11/28/2015  . Benign neoplasm of sigmoid colon   . Hyperlipidemia 05/10/2015    Everlean Alstrom. Graylon Good, PT, DPT 09/06/20, 3:45 PM  [facility] [facility] [address] [place], [address] Phone: [phone]   Fax:  [phone]  Name: Janaria Mccammon MRN: [id number] Date of Birth: [birth year]

## 2020-09-11 ENCOUNTER — Encounter: Payer: Self-pay | Admitting: Physical Therapy

## 2020-09-11 ENCOUNTER — Other Ambulatory Visit: Payer: Self-pay

## 2020-09-11 ENCOUNTER — Ambulatory Visit: Payer: Medicare Other | Attending: Family Medicine | Admitting: Physical Therapy

## 2020-09-11 DIAGNOSIS — M5412 Radiculopathy, cervical region: Secondary | ICD-10-CM | POA: Insufficient documentation

## 2020-09-11 DIAGNOSIS — M6281 Muscle weakness (generalized): Secondary | ICD-10-CM | POA: Diagnosis not present

## 2020-09-11 NOTE — Therapy (Signed)
[place] [facility] [address], [place], [address] Phone: [phone]   Fax:  [phone]  Physical Therapy Treatment / Discharge Summary Dates of reporting: 09/04/2020 - 09/11/2020  Patient Details  Name: Angelica Newton MRN: [id number] Date of Birth: 09/15/1950 Referring Provider (PT): Leone Haven, MD   Encounter Date: 09/11/2020   PT End of Session - 09/11/20 1628    Visit Number 3    Number of Visits 24    Date for PT Re-Evaluation 11/27/20    Authorization Type MEDICARE part B reporting period from 09/04/2020    Progress Note Due on Visit 10    PT Start Time 1518    PT Stop Time 1558    PT Time Calculation (min) 40 min    Activity Tolerance Patient tolerated treatment well    Behavior During Therapy [facility] for tasks assessed/performed           Past Medical History:  Diagnosis Date  . Arthritis    Right hip  . Chicken pox   . Depression   . Motion sickness    in past  . PONV (postoperative nausea and vomiting)   . UTI (urinary tract infection)   . Wears hearing aid    bilateral    Past Surgical History:  Procedure Laterality Date  . AUGMENTATION MAMMAPLASTY Bilateral 2007   mastopexy/breast lift  . BREAST SURGERY  2005   Breast Lift  . COLONOSCOPY WITH PROPOFOL N/A 09/17/2015   Procedure: COLONOSCOPY WITH PROPOFOL;  Surgeon: Lucilla Lame, MD;  Location: [place];  Service: Endoscopy;  Laterality: N/A;  . ORIF ULNAR FRACTURE  1987   Broken Ulna/plates and screws  . POLYPECTOMY  09/17/2015   Procedure: POLYPECTOMY INTESTINAL;  Surgeon: Lucilla Lame, MD;  Location: [place];  Service: Endoscopy;;  Sigmoid colon polyp  . TOTAL HIP ARTHROPLASTY Right 03/09/2019   Procedure: TOTAL HIP ARTHROPLASTY;  Surgeon: Dereck Leep, MD;  Location: [facility];  Service: Orthopedics;  Laterality: Right;  . TUBAL LIGATION      There were no vitals filed for this visit.    Subjective Assessment - 09/11/20 1524    Subjective Patient reports she is feeling well today has successfully performed her HEP at home. States it was better with the 5# weights than the 8# weights. She has started sleeping on her back which has decreased the numbness she feels after sleeping and has repositioned her arms while working on the computer, which has also decreased her symptoms. She feels ready to discharge today.    Pertinent History Patient is a 70 y.o. female who presents to outpatient physical therapy with a referral for medical diagnosis cervical radiculopathy. This patient's chief complaints consist of intermittent short shooting pain down R arm and intermittent R UE paresthesia and neck pain leading to the following functional deficits: decreases sense of wellbeing/QOL, sitting at the computer too long, sleeping, concern she will get "hump" at back of neck.   Relevant past medical history and comorbidities include Hx R THA, osteoporosis, L bundle branch block (not currently a problem), lightheadedness associated with left sided upper quadrant pain (currently being investigated), depression, wears hearing aide, breast lift (2005). Former smoker, ORIF L humerus (1987), wears hearing aide.  Patient Denies hx of cancer, stroke, seizures, lung problem, major cardiac events, diabetes, unexplained weight loss, changes in bowel or bladder problems, new onset stumbling or dropping things.    Limitations Other (comment)  Functional Limitations:  decreases sense of wellbeing/QOL, sitting at the computer too long, sleeping, concern she will get "hump" at back of neck.   Diagnostic tests cervical spine radiograph report 08/21/2020: "IMPRESSION:  Osteoarthritic change at C5-6 and C6-7. No fracture or spondylolisthesis."    Patient Stated Goals "stop the pain in the neck" start a strength training program for upper body, improve posture, prevent hump at back of neck.    Currently in Pain? No/denies           OBJECTIVE  FOTO = 96    TREATMENT:  Therapeutic exercise:to centralize symptoms and improve ROM, strength, muscular endurance, and activity tolerance required for successful completion of functional activities. Demonstrated and practiced 5-10 reps of the following exercises for long term use at patient preference for improved posture ROM and strength:  - wall angels - Standing cervical thoracic extension/BUE flexion and serratus anterior activation, lat stretch, with foam roller up wall - hooklying thoracic extension over foam roll at various spinal level - Sidelying open book (thoracic rotation) to improve thoracic, shoulder girdle, and upper trunk mobility.  - prone swimmer - prone cervical retraction with Y, T, W, and shoulder extension - supine chin tuck for deep neck flexors  Pt required multimodal cuing for proper technique and to facilitate improved neuromuscular control, strength, range of motion, and functional ability resulting in improved performance and form.  HOME EXERCISE PROGRAM Access Code: [email or web address] URL: [email or web address] Date: 09/11/2020 Prepared by: Rosita Kea  Exercises Seated Cervical Retraction - 1 x daily - 3 sets - 10 reps - 5 second hold hold Doorway Pec Stretch at 90 Degrees Abduction - 1 x daily - 3 sets - 30 seconds hold Row with band/cable - 1 x daily - 2-3 x weekly - 2-3 sets - 10-15 reps Push Up on Table - 2-3 x weekly - 2-3 sets - 10-15 reps Standing Bicep Curls Supinated with Dumbbells - 2-3 x weekly - 2-3 sets - 10-15 reps Shoulder Overhead Press in Flexion with Dumbbells - 2-3 x weekly - 2-3 sets - 10-15 reps Bent over row - 2-3 x weekly - 2-3 sets - 10-15 reps Shoulder Abduction with Dumbbells - Palms Down - 2-3 x weekly - 2-3 sets - 10-15 reps Wall Angels Serratus Activation at Wall with Foam Roll Prone Scapular Retraction Prone Scapular Retraction in Abduction Prone Shoulder Horizontal Abduction with Thumbs  Up Sidelying Thoracic Rotation with Open Book  [email or web address]  Home Exercise Program [phone]  PRONE Y -   Prone swimmers -   Deep Neck Flexion -      PT Education - 09/11/20 1628    Education Details Exercise purpose/form. Self management techniques. discharge reccomendations. POC    Person(s) Educated Patient    Methods Explanation;Demonstration;Tactile cues;Verbal cues;Handout    Comprehension Verbalized understanding;Returned demonstration            PT Short Term Goals - 09/06/20 1544      PT SHORT TERM GOAL #1   Title Be independent with initial home exercise program for self-management of symptoms.    Baseline initial HEP provided at IE (09/04/2020);    Time 2    Period Weeks    Status Achieved    Target Date 09/19/20             PT Long Term Goals - 09/11/20 1624      PT LONG TERM GOAL #1   Title Be independent with a long-term home exercise program for self-management of symptoms.  Baseline Initial HEP provided at IE (09/04/2020);    Time 12    Period Weeks    Status Achieved   TARGET DATE FOR ALL LONG TERM GOALS: 11/27/2020     PT LONG TERM GOAL #2   Title Patient will report no difficulty using a shovel to dig a hole in the dirt due to current condition to improve her ability to perform yardwork.    Baseline a little bit of difficulty (09/04/2020); no difficulty (09/11/2020);    Time 12    Period Weeks    Status Achieved      PT LONG TERM GOAL #3   Title Patient will demonstrate suipine deep cervical flexor chin tuck to lift with hold for at least 20 seconds with proper recruitment of SCM and deep cervical neck flexor muscle to demonstrate improved cervical spine muscle performance for improved activity tolerance and posture during functional activities.    Baseline Unable to perform chin tuck without excessive SCM recruitment (09/04/2020); can now perform chin tuck without excessive SCM recruitment (09/11/2020);    Time 12    Period Weeks    Status  Partially Met      PT LONG TERM GOAL #4   Title Patient will report decreased frequency of R UE symptoms to equal or less than once a week to improve her ability to complete funcitonal activities such as sleeping without disruption.    Baseline Unclear but reports she had symptoms yesterday (09/04/2020)    Time 12    Period Weeks    Status Partially Met      PT LONG TERM GOAL #5   Title Improve B UE  strength to 4+/5 for improved ability to allow patient to complete valued functional tasks such as digging with less difficulty.    Baseline 4/5 (09/04/2020);    Time 12    Period Weeks    Status Partially Met                 Plan - 09/11/20 1521    Clinical Impression Statement Patient has completed 3 physical therapy sessions this episode of care and has successfully started a long term UE and postural strengthening program. Patient is having less symptoms and feels satisfied with her progress and ready to discharge to independent management. PT in agreement with current FOTO score 96 suggesting excellent self-reported function. Patient is now discharged from physical therapy due to improvement in condition.    Personal Factors and Comorbidities Age;Time since onset of injury/illness/exacerbation;Comorbidity 3+;Past/Current Experience    Comorbidities Relevant past medical history and comorbidities include Hx R THA, osteoporosis, L bundle branch block (not currently a problem), lightheadedness associated with left sided upper quadrant pain (currently being investigated), depression, wears hearing aide, breast lift (2005). Former smoker, ORIF L humerus (1987),    Examination-Activity Limitations Sleep;Sit   Functional Limitations:  decreases sense of wellbeing/QOL, sitting at the computer too long, sleeping, using a shovel to dig a hole in the dirt, concern she will get "hump" at back of neck.   Examination-Participation Restrictions Yard Work;Interpersonal Relationship     Stability/Clinical Decision Making Stable/Uncomplicated    Rehab Potential Good    PT Frequency 2x / week    PT Duration 12 weeks    PT Treatment/Interventions ADLs/Self Care Home Management;Cryotherapy;Electrical Stimulation;Moist Heat;Therapeutic activities;Therapeutic exercise;Neuromuscular re-education;Manual techniques;Passive range of motion;Dry needling;Spinal Manipulations;Joint Manipulations    PT Next Visit Plan Patient is now discharged from physical therapy due to improvement in condition.    PT  Home Exercise Plan Medbridge Access Code: [email or web address]    Consulted and Agree with Plan of Care Patient           Patient will benefit from skilled therapeutic intervention in order to improve the following deficits and impairments:  Improper body mechanics,Pain,Decreased coordination,Decreased mobility,Increased muscle spasms,Decreased activity tolerance,Decreased range of motion,Hypomobility,Decreased strength,Decreased endurance,Impaired UE functional use,Impaired flexibility  Visit Diagnosis: Radiculopathy, cervical region  Muscle weakness (generalized)     Problem List Patient Active Problem List   Diagnosis Date Noted  . Cervical radiculopathy 08/21/2020  . Atypical chest pain 08/21/2020  . Breast pain 08/21/2020  . Chronic left upper quadrant pain 08/21/2020  . Encounter for screening mammogram for malignant neoplasm of breast 08/08/2019  . H/O total hip arthroplasty 03/09/2019  . Left bundle branch block (LBBB) 03/02/2019  . Primary osteoarthritis of right hip 01/09/2019  . Seborrheic keratosis 05/10/2018  . Osteoporosis 07/20/2017  . Eustachian tube dysfunction 07/20/2017  . Light headedness 05/08/2017  . Vision changes 05/08/2017  . Rash and nonspecific skin eruption 11/28/2015  . Benign neoplasm of sigmoid colon   . Hyperlipidemia 05/10/2015    Everlean Alstrom. Graylon Good, PT, DPT 09/11/20, 4:29 PM  [facility] [address] [place], [address] Phone: [phone]   Fax:  [phone]  Name: Angelica Newton MRN: [id number] Date of Birth: 14-Jan-1951

## 2020-09-17 ENCOUNTER — Encounter: Payer: Medicare Other | Admitting: Physical Therapy

## 2020-09-17 ENCOUNTER — Other Ambulatory Visit: Payer: Self-pay | Admitting: Family Medicine

## 2020-09-17 DIAGNOSIS — R1012 Left upper quadrant pain: Secondary | ICD-10-CM

## 2020-09-18 ENCOUNTER — Encounter: Payer: Self-pay | Admitting: *Deleted

## 2020-09-19 ENCOUNTER — Encounter: Payer: Medicare Other | Admitting: Physical Therapy

## 2020-09-24 ENCOUNTER — Encounter: Payer: Medicare Other | Admitting: Physical Therapy

## 2020-09-26 ENCOUNTER — Encounter: Payer: Medicare Other | Admitting: Physical Therapy

## 2020-10-01 ENCOUNTER — Encounter: Payer: Medicare Other | Admitting: Physical Therapy

## 2020-10-04 ENCOUNTER — Encounter: Payer: Medicare Other | Admitting: Physical Therapy

## 2020-11-08 ENCOUNTER — Other Ambulatory Visit: Payer: Self-pay

## 2020-11-14 ENCOUNTER — Encounter: Payer: Self-pay | Admitting: Family Medicine

## 2020-11-14 ENCOUNTER — Ambulatory Visit (INDEPENDENT_AMBULATORY_CARE_PROVIDER_SITE_OTHER): Payer: Medicare Other | Admitting: Family Medicine

## 2020-11-14 ENCOUNTER — Other Ambulatory Visit: Payer: Self-pay

## 2020-11-14 VITALS — BP 90/60 | HR 68 | Temp 98.5°F | Ht 68.0 in | Wt 151.2 lb

## 2020-11-14 DIAGNOSIS — R1012 Left upper quadrant pain: Secondary | ICD-10-CM

## 2020-11-14 DIAGNOSIS — G8929 Other chronic pain: Secondary | ICD-10-CM

## 2020-11-14 DIAGNOSIS — Z1211 Encounter for screening for malignant neoplasm of colon: Secondary | ICD-10-CM | POA: Diagnosis not present

## 2020-11-14 DIAGNOSIS — Z23 Encounter for immunization: Secondary | ICD-10-CM

## 2020-11-14 DIAGNOSIS — M5412 Radiculopathy, cervical region: Secondary | ICD-10-CM | POA: Diagnosis not present

## 2020-11-14 DIAGNOSIS — R0789 Other chest pain: Secondary | ICD-10-CM | POA: Diagnosis not present

## 2020-11-14 DIAGNOSIS — N644 Mastodynia: Secondary | ICD-10-CM

## 2020-11-14 MED ORDER — ZOSTER VAC RECOMB ADJUVANTED 50 MCG/0.5ML IM SUSR: 0.5000 mL | Freq: Once | INTRAMUSCULAR | 0 refills | Status: DC

## 2020-11-14 NOTE — Assessment & Plan Note (Signed)
Symptoms have resolved with physical therapy.  I encouraged her to continue with her physical therapy exercises.

## 2020-11-14 NOTE — Assessment & Plan Note (Signed)
Resolved with dietary changes.  I encouraged her to avoid granola and [place] yogurt.

## 2020-11-14 NOTE — Patient Instructions (Signed)
Nice to see you. Please continue with your physical therapy exercises.

## 2020-11-14 NOTE — Assessment & Plan Note (Signed)
Unlikely to be true breast pain given prior work-up and exam.  She had a reassuring mammogram.

## 2020-11-14 NOTE — Assessment & Plan Note (Signed)
Likely costochondritis related.  This has resolved.

## 2020-11-14 NOTE — Addendum Note (Signed)
Addended by: Fulton Mole D on: 11/14/2020 12:36 PM   Modules accepted: Orders

## 2020-11-14 NOTE — Addendum Note (Signed)
Addended by: Fulton Mole D on: 11/14/2020 02:18 PM   Modules accepted: Orders

## 2020-11-14 NOTE — Progress Notes (Addendum)
Tommi Rumps, MD Phone: [phone]  Annye Forrey Pica is a 70 y.o. female who presents today for f/u.  Left upper quadrant pain: Patient had an ultrasound that was negative for a cause for this.  There was a small liver cyst that was felt to need no further follow-up.  She has bowel movements most days.  She does not strain.  She notes she stopped eating [place] yogurt and granola and her abdominal pain resolved.  She had this again several days ago and had some stomach issues.  Cervical radiculopathy: She notes this has resolved with her physical therapy exercises.  She had some degenerative changes on x-ray.  Chest wall pain: She had a diagnostic mammogram that revealed normal breast tissue.  She notes the discomfort has resolved.  Social History   Tobacco Use  Smoking Status Former Smoker  . Years: 5.00  . Types: Cigarettes  . Quit date: 06/09/2010  . Years since quitting: 10.4  Smokeless Tobacco Never Used  Tobacco Comment   only smoked 1 cig per day    Current Outpatient Medications on File Prior to Visit  Medication Sig Dispense Refill  . Cholecalciferol (VITAMIN D3) 50 MCG (2000 UT) TABS Take 2,000 Units by mouth every evening.    . ezetimibe (ZETIA) 10 MG tablet TAKE 1 TABLET(10 MG) BY MOUTH DAILY 90 tablet 3  . Misc Natural Products (LUTEIN 20 PO) Take 20 mg by mouth every evening.    . Multiple Minerals-Vitamins (CALCIUM-MAGNESIUM-ZINC-D3 PO) Take 2 tablets by mouth every evening.    . Multiple Vitamin (MULTIVITAMIN WITH MINERALS) TABS tablet Take 1 tablet by mouth every evening. Women's One A Day     No current facility-administered medications on file prior to visit.     ROS see history of present illness  Objective  Physical Exam Vitals:   11/14/20 0913  BP: 90/60  Pulse: 68  Temp: 98.5 F (36.9 C)  SpO2: 98%    BP Readings from Last 3 Encounters:  11/14/20 90/60  08/21/20 110/80  08/08/19 120/70   Wt Readings from Last 3 Encounters:   11/14/20 151 lb 3.2 oz (68.6 kg)  08/21/20 150 lb 12.8 oz (68.4 kg)  12/26/19 148 lb (67.1 kg)    Physical Exam Constitutional:      General: She is not in acute distress.    Appearance: She is not diaphoretic.  Cardiovascular:     Rate and Rhythm: Normal rate and regular rhythm.     Heart sounds: Normal heart sounds.  Pulmonary:     Effort: Pulmonary effort is normal.     Breath sounds: Normal breath sounds.  Abdominal:     General: Bowel sounds are normal. There is no distension.     Palpations: Abdomen is soft.     Tenderness: There is no abdominal tenderness. There is no guarding or rebound.  Skin:    General: Skin is warm and dry.  Neurological:     Mental Status: She is alert.      Assessment/Plan: Please see individual problem list.  Problem List Items Addressed This Visit    Cervical radiculopathy    Symptoms have resolved with physical therapy.  I encouraged her to continue with her physical therapy exercises.      Atypical chest pain    Likely costochondritis related.  This has resolved.      Breast pain    Unlikely to be true breast pain given prior work-up and exam.  She had a reassuring mammogram.  Chronic left upper quadrant pain    Resolved with dietary changes.  I encouraged her to avoid granola and [place] yogurt.       Other Visit Diagnoses    Colon cancer screening    -  Primary   Relevant Orders   Ambulatory referral to Gastroenterology   Need for pneumococcal vaccination       Relevant Orders   Pneumococcal polysaccharide vaccine 23-valent greater than or equal to 2yo subcutaneous/IM (Completed)       Health Maintenance: she will get her shingrix vaccine at the pharmacy. Pneumovax given today.   Return in about 9 months (around 08/14/2021) for Yearly exam.  This visit occurred during the SARS-CoV-2 public health emergency.  Safety protocols were in place, including screening questions prior to the visit, additional usage of staff  PPE, and extensive cleaning of exam room while observing appropriate contact time as indicated for disinfecting solutions.    Tommi Rumps, MD [place]

## 2020-11-27 DIAGNOSIS — L918 Other hypertrophic disorders of the skin: Secondary | ICD-10-CM | POA: Diagnosis not present

## 2020-11-27 DIAGNOSIS — D225 Melanocytic nevi of trunk: Secondary | ICD-10-CM | POA: Diagnosis not present

## 2020-11-27 DIAGNOSIS — D2262 Melanocytic nevi of left upper limb, including shoulder: Secondary | ICD-10-CM | POA: Diagnosis not present

## 2020-11-27 DIAGNOSIS — D2261 Melanocytic nevi of right upper limb, including shoulder: Secondary | ICD-10-CM | POA: Diagnosis not present

## 2020-11-27 DIAGNOSIS — D2272 Melanocytic nevi of left lower limb, including hip: Secondary | ICD-10-CM | POA: Diagnosis not present

## 2020-11-27 DIAGNOSIS — L821 Other seborrheic keratosis: Secondary | ICD-10-CM | POA: Diagnosis not present

## 2020-11-27 DIAGNOSIS — D2271 Melanocytic nevi of right lower limb, including hip: Secondary | ICD-10-CM | POA: Diagnosis not present

## 2020-12-16 DIAGNOSIS — Z20822 Contact with and (suspected) exposure to covid-19: Secondary | ICD-10-CM | POA: Diagnosis not present

## 2020-12-26 ENCOUNTER — Ambulatory Visit: Payer: Medicare Other

## 2021-01-02 ENCOUNTER — Telehealth (INDEPENDENT_AMBULATORY_CARE_PROVIDER_SITE_OTHER): Payer: Self-pay | Admitting: Gastroenterology

## 2021-01-02 DIAGNOSIS — Z86010 Personal history of colonic polyps: Secondary | ICD-10-CM

## 2021-01-02 MED ORDER — CLENPIQ 10-3.5-12 MG-GM -GM/160ML PO SOLN: 1.0000 | Freq: Once | ORAL | 0 refills | Status: AC

## 2021-01-02 NOTE — Progress Notes (Signed)
Gastroenterology Pre-Procedure Review  Request Date: 01/29/21 Requesting Physician: Dr. Allen Norris  PATIENT REVIEW QUESTIONS: The patient responded to the following health history questions as indicated:    1. Are you having any GI issues? no 2. Do you have a personal history of Polyps? yes (09/17/2015) 3. Do you have a family history of Colon Cancer or Polyps? no 4. Diabetes Mellitus? no 5. Joint replacements in the past 12 months?no 6. Major health problems in the past 3 months?no 7. Any artificial heart valves, MVP, or defibrillator?no    MEDICATIONS & ALLERGIES:    Patient reports the following regarding taking any anticoagulation/antiplatelet therapy:   Plavix, Coumadin, Eliquis, Xarelto, Lovenox, Pradaxa, Brilinta, or Effient? no Aspirin? no  Patient confirms/reports the following medications:  Current Outpatient Medications  Medication Sig Dispense Refill   Cholecalciferol (VITAMIN D3) 50 MCG (2000 UT) TABS Take 2,000 Units by mouth every evening.     ezetimibe (ZETIA) 10 MG tablet TAKE 1 TABLET(10 MG) BY MOUTH DAILY 90 tablet 3   Misc Natural Products (LUTEIN 20 PO) Take 20 mg by mouth every evening.     Multiple Minerals-Vitamins (CALCIUM-MAGNESIUM-ZINC-D3 PO) Take 2 tablets by mouth every evening.     Multiple Vitamin (MULTIVITAMIN WITH MINERALS) TABS tablet Take 1 tablet by mouth every evening. Women's One A Day     No current facility-administered medications for this visit.    Patient confirms/reports the following allergies:  Allergies  Allergen Reactions   Lipitor [Atorvastatin]     "feels terrible"   Tetracyclines & Related Other (See Comments)    Just felt lousey   Zocor [Simvastatin] Other (See Comments)    Flu type symptoms   Mango Butter Rash    Just MANGOS not MANGO BUTTER Swollen lips    No orders of the defined types were placed in this encounter.   AUTHORIZATION INFORMATION Primary Insurance: 1D#: Group #:  Secondary Insurance: 1D#: Group  #:  SCHEDULE INFORMATION: Date: 01/29/21 Time: Location: [place]

## 2021-01-08 DIAGNOSIS — R208 Other disturbances of skin sensation: Secondary | ICD-10-CM | POA: Diagnosis not present

## 2021-01-08 DIAGNOSIS — L298 Other pruritus: Secondary | ICD-10-CM | POA: Diagnosis not present

## 2021-01-08 DIAGNOSIS — L7211 Pilar cyst: Secondary | ICD-10-CM | POA: Diagnosis not present

## 2021-01-21 ENCOUNTER — Ambulatory Visit (INDEPENDENT_AMBULATORY_CARE_PROVIDER_SITE_OTHER): Payer: Medicare Other

## 2021-01-21 VITALS — Ht 68.0 in | Wt 148.0 lb

## 2021-01-21 DIAGNOSIS — Z0000 Encounter for general adult medical examination without abnormal findings: Secondary | ICD-10-CM

## 2021-01-21 NOTE — Patient Instructions (Addendum)
  Ms. Cantor , Thank you for taking time to come for your Medicare Wellness Visit. I appreciate your ongoing commitment to your health goals. Please review the following plan we discussed and let me know if I can assist you in the future.   These are the goals we discussed:  Goals       Patient Stated     Maintain Healthy Lifestyle (pt-stated)        This is a list of the screening recommended for you and due dates:  Health Maintenance  Topic Date Due   Colon Cancer Screening  09/16/2020   COVID-19 Vaccine (4 - Booster for Pfizer series) 02/06/2021*   Flu Shot  03/09/2021*   Zoster (Shingles) Vaccine (1 of 2) 04/23/2021*   Mammogram  08/29/2022   Tetanus Vaccine  06/10/2023   DEXA scan (bone density measurement)  Completed   Hepatitis C Screening: USPSTF Recommendation to screen - Ages 68-79 yo.  Completed   Pneumonia vaccines  Completed   HPV Vaccine  Aged Out  *Topic was postponed. The date shown is not the original due date.

## 2021-01-21 NOTE — Progress Notes (Signed)
Subjective:   Angelica Newton is a 69 y.o. female who presents for Medicare Annual (Subsequent) preventive examination.  Review of Systems    No ROS.  Medicare Wellness Virtual Visit.  Visual/audio telehealth visit, UTA vital signs.   See social history for additional risk factors.         Objective:    Today's Vitals   01/21/21 1533  Weight: 148 lb (67.1 kg)  Height: '5\' 8"'$  (1.727 m)   Body mass index is 22.5 kg/m.  Advanced Directives 01/21/2021 09/04/2020 12/26/2019 03/09/2019 03/09/2019 12/24/2018 09/17/2015  Does Patient Have a Medical Advance Directive? Yes Yes Yes Yes Yes Yes No  Type of Paramedic of [place];Living will - [place];Living will Living will;Healthcare Power of [place];Living will [place];Living will -  Does patient want to make changes to medical advance directive? No - Patient declined No - Patient declined No - Patient declined No - Patient declined - No - Patient declined -  Copy of [place] in Chart? Yes - validated most recent copy scanned in chart (See row information) - Yes - validated most recent copy scanned in chart (See row information) - Yes - validated most recent copy scanned in chart (See row information) No - copy requested -  Would patient like information on creating a medical advance directive? - - - - - - No - patient declined information    Current Medications (verified) Outpatient Encounter Medications as of 01/21/2021  Medication Sig   Cholecalciferol (VITAMIN D3) 50 MCG (2000 UT) TABS Take 2,000 Units by mouth every evening.   ezetimibe (ZETIA) 10 MG tablet TAKE 1 TABLET(10 MG) BY MOUTH DAILY   Misc Natural Products (LUTEIN 20 PO) Take 20 mg by mouth every evening.   Multiple Minerals-Vitamins (CALCIUM-MAGNESIUM-ZINC-D3 PO) Take 2 tablets by mouth every evening.   Multiple Vitamin (MULTIVITAMIN WITH MINERALS) TABS tablet  Take 1 tablet by mouth every evening. Women's One A Day   No facility-administered encounter medications on file as of 01/21/2021.    Allergies (verified) Lipitor [atorvastatin], Tetracyclines & related, Zocor [simvastatin], and Mango butter   History: Past Medical History:  Diagnosis Date   Arthritis    Right hip   Chicken pox    Depression    Motion sickness    in past   PONV (postoperative nausea and vomiting)    UTI (urinary tract infection)    Wears hearing aid    bilateral   Past Surgical History:  Procedure Laterality Date   AUGMENTATION MAMMAPLASTY Bilateral 2007   mastopexy/breast lift   BREAST SURGERY  2005   Breast Lift   COLONOSCOPY WITH PROPOFOL N/A 09/17/2015   Procedure: COLONOSCOPY WITH PROPOFOL;  Surgeon: Lucilla Lame, MD;  Location: [facility];  Service: Endoscopy;  Laterality: N/A;   ORIF ULNAR FRACTURE  1987   Broken Ulna/plates and screws   POLYPECTOMY  09/17/2015   Procedure: POLYPECTOMY INTESTINAL;  Surgeon: Lucilla Lame, MD;  Location: [facility];  Service: Endoscopy;;  Sigmoid colon polyp   TOTAL HIP ARTHROPLASTY Right 03/09/2019   Procedure: TOTAL HIP ARTHROPLASTY;  Surgeon: Dereck Leep, MD;  Location: [facility];  Service: Orthopedics;  Laterality: Right;   TUBAL LIGATION     Family History  Problem Relation Age of Onset   Hyperlipidemia Mother    Stroke Mother    Diabetes Mother    Hyperlipidemia Father    Stroke Father  Hypertension Father    Kidney failure Father    Diabetes Father    Diabetes Brother    Cancer Brother        squamous cell carcinoma   Breast cancer Paternal Aunt    Diabetes Maternal Grandmother    Cancer Paternal Grandmother        Breast   Social History   Socioeconomic History   Marital status: Single    Spouse name: Not on file   Number of children: Not on file   Years of education: Not on file   Highest education level: Not on file  Occupational History   Not on file  Tobacco Use    Smoking status: Former    Years: 5.00    Types: Cigarettes    Quit date: 06/09/2010    Years since quitting: 10.6   Smokeless tobacco: Never   Tobacco comments:    only smoked 1 cig per day  Vaping Use   Vaping Use: Never used  Substance and Sexual Activity   Alcohol use: Yes    Alcohol/week: 10.0 standard drinks    Types: 10 Glasses of wine per week    Comment: occ   Drug use: No   Sexual activity: Yes    Partners: Male  Other Topics Concern   Not on file  Social History Narrative   Retired, but volunteers    Lives by herself   Children- 2 (61 and 69 yo)    Pets: None    Caffeine: 2 cups in the morning, diet coke rarely    Right handed    Enjoys bike riding, cooking, traveling    Social Determinants of Radio broadcast assistant Strain: Low Risk    Difficulty of Paying Living Expenses: Not hard at all  Food Insecurity: No Food Insecurity   Worried About Charity fundraiser in the Last Year: Never true   Arboriculturist in the Last Year: Never true  Transportation Needs: No Transportation Needs   Lack of Transportation (Medical): No   Lack of Transportation (Non-Medical): No  Physical Activity: Sufficiently Active   Days of Exercise per Week: 5 days   Minutes of Exercise per Session: 60 min  Stress: No Stress Concern Present   Feeling of Stress : Not at all  Social Connections: Unknown   Frequency of Communication with Friends and Family: More than three times a week   Frequency of Social Gatherings with Friends and Family: More than three times a week   Attends Religious Services: Not on Electrical engineer or Organizations: Yes   Attends Music therapist: More than 4 times per year   Marital Status: Not on file    Tobacco Counseling Counseling given: Not Answered Tobacco comments: only smoked 1 cig per day   Clinical Intake:  Pre-visit preparation completed: Yes        Diabetes: No  How often do you need to have someone help  you when you read instructions, pamphlets, or other written materials from your doctor or pharmacy?: 1 - Never    Interpreter Needed?: No      Activities of Daily Living In your present state of health, do you have any difficulty performing the following activities: 01/21/2021  Hearing? Y  Comment Hearing aids  Vision? N  Difficulty concentrating or making decisions? N  Walking or climbing stairs? N  Dressing or bathing? N  Doing errands, shopping? N  Conservation officer, nature and  eating ? N  Using the Toilet? N  In the past six months, have you accidently leaked urine? N  Do you have problems with loss of bowel control? N  Managing your Medications? N  Managing your Finances? N  Housekeeping or managing your Housekeeping? N  Some recent data might be hidden    Patient Care Team: Leone Haven, MD as PCP - General (Family Medicine)  Indicate any recent Medical Services you may have received from other than [facility] providers in the past year (date may be approximate).     Assessment:   This is a routine wellness examination for Angelica Newton.  I connected with Angelica Newton today by telephone and verified that I am speaking with the correct person using two identifiers. Location patient: home Location provider: work Persons participating in the virtual visit: patient, Marine scientist.    I discussed the limitations, risks, security and privacy concerns of performing an evaluation and management service by telephone and the availability of in person appointments. The patient expressed understanding and verbally consented to this telephonic visit.    Interactive audio and video telecommunications were attempted between this provider and patient, however failed, due to patient having technical difficulties OR patient did not have access to video capability.  We continued and completed visit with audio only.  Some vital signs may be absent or patient reported.   Hearing/Vision screen Hearing Screening  - Comments:: Hearing aids Vision Screening - Comments:: Wears corrective lenses  They have seen their ophthalmologist in the last 12 months.   Dietary issues and exercise activities discussed: Current Exercise Habits: Home exercise routine Healthy diet Good water intake   Goals Addressed               This Visit's Progress     Patient Stated     Maintain Healthy Lifestyle (pt-stated)         Depression Screen PHQ 2/9 Scores 01/21/2021 08/21/2020 12/26/2019 08/08/2019 12/24/2018 05/10/2018  PHQ - 2 Score 0 0 0 0 0 0    Fall Risk Fall Risk  01/21/2021 11/14/2020 08/21/2020 12/26/2019 08/08/2019  Falls in the past year? 0 0 0 0 0  Comment - - - - -  Number falls in past yr: - 0 0 0 0  Injury with Fall? 0 - - - -  Follow up Falls evaluation completed Falls evaluation completed Falls evaluation completed Falls evaluation completed Falls evaluation completed    [place]: Adequate lighting in your home to reduce risk of falls? Yes   ASSISTIVE DEVICES UTILIZED TO PREVENT FALLS: Use of a cane, walker or w/c? No   TIMED UP AND GO: Was the test performed? No .   Cognitive Function:  Patient is alert and oriented x3.    6CIT Screen 12/26/2019 12/24/2018  What Year? 0 points 0 points  What month? 0 points 0 points  What time? 0 points 0 points  Count back from 20 - 0 points  Months in reverse - 0 points  Repeat phrase - 0 points  Total Score - 0    Immunizations Immunization History  Administered Date(s) Administered   Influenza Split 07/03/2014, 04/17/2020   Influenza, High Dose Seasonal PF 05/10/2018   Influenza,inj,Quad PF,6+ Mos 05/10/2015   PFIZER Comirnaty(Gray Top)Covid-19 Tri-Sucrose Vaccine 07/27/2019, 08/17/2019   PFIZER(Purple Top)SARS-COV-2 Vaccination 07/27/2019, 08/17/2019, 04/17/2020   Pneumococcal Conjugate-13 05/08/2017   Pneumococcal Polysaccharide-23 11/14/2020   Shingrix vaccine- Due, Education has been provided regarding  the importance  of this vaccine. Advised may receive this vaccine at local pharmacy or Health Dept. Aware to provide a copy of the vaccination record if obtained from local pharmacy or Health Dept. Verbalized acceptance and understanding. Deferred.   Health Maintenance Health Maintenance  Topic Date Due   COLONOSCOPY (Pts 45-40yr Insurance coverage will need to be confirmed)  09/16/2020   COVID-19 Vaccine (4 - Booster for [employer]) 02/06/2021 (Originally 07/18/2020)   INFLUENZA VACCINE  03/09/2021 (Originally 01/07/2021)   Zoster Vaccines- Shingrix (1 of 2) 04/23/2021 (Originally 03/11/1970)   MAMMOGRAM  08/29/2022   TETANUS/TDAP  06/10/2023   DEXA SCAN  Completed   Hepatitis C Screening  Completed   PNA vac Low Risk Adult  Completed   HPV VACCINES  Aged Out   Colonoscopy- scheduled.   Lung Cancer Screening: (Low Dose CT Chest recommended if Age 69-80years, 30 pack-year currently smoking OR have quit w/in 15years.) does not qualify.   Vision Screening: Recommended annual ophthalmology exams for early detection of glaucoma and other disorders of the eye.  Dental Screening: Recommended annual dental exams for proper oral hygiene  Community Resource Referral / Chronic Care Management: CRR required this visit?  No   CCM required this visit?  No      Plan:   Keep all routine maintenance appointments.   I have personally reviewed and noted the following in the patient's chart:   Medical and social history Use of alcohol, tobacco or illicit drugs  Current medications and supplements including opioid prescriptions. Not currently taking opioid.  Functional ability and status Nutritional status Physical activity Advanced directives List of other physicians Hospitalizations, surgeries, and ER visits in previous 12 months Vitals Screenings to include cognitive, depression, and falls Referrals and appointments  In addition, I have reviewed and discussed with patient certain  preventive protocols, quality metrics, and best practice recommendations. A written personalized care plan for preventive services as well as general preventive health recommendations were provided to patient via mychart.     OVarney Biles LPN   [id number]

## 2021-01-22 DIAGNOSIS — H53141 Visual discomfort, right eye: Secondary | ICD-10-CM | POA: Diagnosis not present

## 2021-01-22 DIAGNOSIS — H353131 Nonexudative age-related macular degeneration, bilateral, early dry stage: Secondary | ICD-10-CM | POA: Diagnosis not present

## 2021-01-22 DIAGNOSIS — H524 Presbyopia: Secondary | ICD-10-CM | POA: Diagnosis not present

## 2021-01-22 DIAGNOSIS — H2513 Age-related nuclear cataract, bilateral: Secondary | ICD-10-CM | POA: Diagnosis not present

## 2021-01-29 ENCOUNTER — Other Ambulatory Visit: Payer: Self-pay

## 2021-01-29 ENCOUNTER — Encounter
Admission: RE | Disposition: A | Discharge status: Home / Self Care | Payer: Self-pay | Source: Home / Self Care | Attending: Gastroenterology

## 2021-01-29 ENCOUNTER — Encounter: Payer: Self-pay | Admitting: Gastroenterology

## 2021-01-29 ENCOUNTER — Ambulatory Visit: Payer: Medicare Other | Admitting: Anesthesiology

## 2021-01-29 ENCOUNTER — Ambulatory Visit
Admission: RE | Admit: 2021-01-29 | Discharge: 2021-01-29 | Disposition: A | Discharge status: Home / Self Care | Payer: Medicare Other | Attending: Gastroenterology | Admitting: Gastroenterology

## 2021-01-29 DIAGNOSIS — K635 Polyp of colon: Secondary | ICD-10-CM

## 2021-01-29 DIAGNOSIS — Z1211 Encounter for screening for malignant neoplasm of colon: Secondary | ICD-10-CM | POA: Insufficient documentation

## 2021-01-29 DIAGNOSIS — Z860100 Personal history of colon polyps, unspecified: Secondary | ICD-10-CM

## 2021-01-29 DIAGNOSIS — Z888 Allergy status to other drugs, medicaments and biological substances status: Secondary | ICD-10-CM | POA: Insufficient documentation

## 2021-01-29 DIAGNOSIS — Z881 Allergy status to other antibiotic agents status: Secondary | ICD-10-CM | POA: Insufficient documentation

## 2021-01-29 DIAGNOSIS — D126 Benign neoplasm of colon, unspecified: Secondary | ICD-10-CM | POA: Diagnosis not present

## 2021-01-29 DIAGNOSIS — Z8619 Personal history of other infectious and parasitic diseases: Secondary | ICD-10-CM | POA: Diagnosis not present

## 2021-01-29 DIAGNOSIS — Z96641 Presence of right artificial hip joint: Secondary | ICD-10-CM | POA: Insufficient documentation

## 2021-01-29 DIAGNOSIS — Z87891 Personal history of nicotine dependence: Secondary | ICD-10-CM | POA: Insufficient documentation

## 2021-01-29 HISTORY — PX: COLONOSCOPY WITH PROPOFOL: SHX5780

## 2021-01-29 SURGERY — COLONOSCOPY WITH PROPOFOL
Anesthesia: General

## 2021-01-29 MED ORDER — PROPOFOL 10 MG/ML IV BOLUS
INTRAVENOUS | Status: AC
  Filled 2021-01-29: qty 20

## 2021-01-29 MED ORDER — LIDOCAINE HCL (CARDIAC) PF 100 MG/5ML IV SOSY
PREFILLED_SYRINGE | INTRAVENOUS | Status: DC | PRN
  Administered 2021-01-29: 50 mg via INTRAVENOUS

## 2021-01-29 MED ORDER — SODIUM CHLORIDE 0.9 % IV SOLN: INTRAVENOUS | Status: DC

## 2021-01-29 MED ORDER — PROPOFOL 500 MG/50ML IV EMUL
INTRAVENOUS | Status: AC
  Filled 2021-01-29: qty 50

## 2021-01-29 MED ORDER — PROPOFOL 10 MG/ML IV BOLUS
INTRAVENOUS | Status: DC | PRN
  Administered 2021-01-29: 70 mg via INTRAVENOUS

## 2021-01-29 MED ORDER — PROPOFOL 500 MG/50ML IV EMUL
INTRAVENOUS | Status: DC | PRN
  Administered 2021-01-29: 100 ug/kg/min via INTRAVENOUS

## 2021-01-29 NOTE — Transfer of Care (Signed)
Immediate Anesthesia Transfer of Care Note  Patient: Angelica Newton  Procedure(s) Performed: COLONOSCOPY WITH PROPOFOL  Patient Location: PACU  Anesthesia Type:MAC and General  Level of Consciousness: awake and oriented  Airway & Oxygen Therapy: Patient Spontanous Breathing and Patient connected to nasal cannula oxygen  Post-op Assessment: Report given to RN and Post -op Vital signs reviewed and stable  Post vital signs: Reviewed and stable  Last Vitals:  Vitals Value Taken Time  BP    Temp    Pulse    Resp    SpO2      Last Pain:  Vitals:   01/29/21 0819  TempSrc: Temporal  PainSc: 0-No pain         Complications: No notable events documented.

## 2021-01-29 NOTE — Anesthesia Preprocedure Evaluation (Signed)
Anesthesia Evaluation  Patient identified by MRN, date of birth, ID band Patient awake    Reviewed: Allergy & Precautions, H&P , NPO status , Patient's Chart, lab work & pertinent test results  History of Anesthesia Complications (+) PONV and history of anesthetic complications  Airway Mallampati: II  TM Distance: >3 FB Neck ROM: full    Dental  (+) Teeth Intact   Pulmonary neg pulmonary ROS, neg COPD, former smoker,           Cardiovascular (-) angina(-) Past MI and (-) Cardiac Stents + dysrhythmias (LBBB, negative stress test)      Neuro/Psych PSYCHIATRIC DISORDERS Depression  Neuromuscular disease    GI/Hepatic negative GI ROS, Neg liver ROS, Bowel prep,neg GERD  ,  Endo/Other  negative endocrine ROS  Renal/GU      Musculoskeletal  (+) Arthritis , Osteoarthritis,    Abdominal   Peds  Hematology negative hematology ROS (+)   Anesthesia Other Findings Arthritis  Right hip  Chicken pox    Depression    Motion sickness  in past  PONV (postoperative nausea and vomiting)  UTI (urinary tract infection)    Wears hearing aid        Reproductive/Obstetrics negative OB ROS                             Anesthesia Physical  Anesthesia Plan  ASA: 2  Anesthesia Plan: General   Post-op Pain Management:    Induction: Intravenous  PONV Risk Score and Plan: 3 and Propofol infusion and TIVA  Airway Management Planned: Natural Airway and Nasal Cannula  Additional Equipment:   Intra-op Plan:   Post-operative Plan:   Informed Consent: I have reviewed the patients History and Physical, chart, labs and discussed the procedure including the risks, benefits and alternatives for the proposed anesthesia with the patient or authorized representative who has indicated his/her understanding and acceptance.     Dental Advisory Given  Plan Discussed with: Anesthesiologist, CRNA and  Surgeon  Anesthesia Plan Comments:         Anesthesia Quick Evaluation

## 2021-01-29 NOTE — Op Note (Signed)
[facility] Gastroenterology Patient Name: Angelica Newton Procedure Date: 01/29/2021 8:45 AM MRN: [id number] Account #: [id number] Date of Birth: [birth year] Admit Type: Outpatient Age: 70 Room: [facility] ENDO ROOM 4 Gender: Female Note Status: Finalized Procedure:             Colonoscopy Indications:           High risk colon cancer surveillance: Personal history                         of colonic polyps Providers:             Lucilla Lame MD, MD Referring MD:          Angela Adam. Caryl Bis (Referring MD) Medicines:             Propofol per Anesthesia Complications:         No immediate complications. Procedure:             Pre-Anesthesia Assessment:                        - Prior to the procedure, a History and Physical was                         performed, and patient medications and allergies were                         reviewed. The patient's tolerance of previous                         anesthesia was also reviewed. The risks and benefits                         of the procedure and the sedation options and risks                         were discussed with the patient. All questions were                         answered, and informed consent was obtained. Prior                         Anticoagulants: The patient has taken no previous                         anticoagulant or antiplatelet agents. ASA Grade                         Assessment: II - A patient with mild systemic disease.                         After reviewing the risks and benefits, the patient                         was deemed in satisfactory condition to undergo the                         procedure.  After obtaining informed consent, the colonoscope was                         passed under direct vision. Throughout the procedure,                         the patient's blood pressure, pulse, and oxygen                         saturations were monitored continuously. The                          Colonoscope was introduced through the anus and                         advanced to the the cecum, identified by appendiceal                         orifice and ileocecal valve. The colonoscopy was                         performed without difficulty. The patient tolerated                         the procedure well. The quality of the bowel                         preparation was excellent. Findings:      The perianal and digital rectal examinations were normal.      A 2 mm polyp was found in the transverse colon. The polyp was sessile.       The polyp was removed with a cold biopsy forceps. Resection and       retrieval were complete.      A 3 mm polyp was found in the sigmoid colon. The polyp was removed with       a cold biopsy forceps. Resection and retrieval were complete. Impression:            - One 2 mm polyp in the transverse colon, removed with                         a cold biopsy forceps. Resected and retrieved.                        - One 3 mm polyp in the sigmoid colon, removed with a                         cold biopsy forceps. Resected and retrieved. Recommendation:        - Discharge patient to home.                        - Resume previous diet.                        - Continue present medications.                        - Await pathology results.                        -  Repeat colonoscopy in 7 years for surveillance. Procedure Code(s):     --- Professional ---                        [id number], Colonoscopy, flexible; with biopsy, single or                         multiple Diagnosis Code(s):     --- Professional ---                        Z86.010, Personal history of colonic polyps                        K63.5, Polyp of colon CPT copyright 2019 American Medical Association. All rights reserved. The codes documented in this report are preliminary and upon coder review may  be revised to meet current compliance requirements. Lucilla Lame MD, MD 01/29/2021  9:04:45 AM This report has been signed electronically. Number of Addenda: 0 Note Initiated On: 01/29/2021 8:45 AM Scope Withdrawal Time: 0 hours 7 minutes 24 seconds  Total Procedure Duration: 0 hours 12 minutes 7 seconds  Estimated Blood Loss:  Estimated blood loss: none.      [facility]

## 2021-01-29 NOTE — H&P (Signed)
Lucilla Lame, MD [facility] [address] [place], [place] [address] Phone:[phone] Fax : [phone]  Primary Care Physician:  Leone Haven, MD Primary Gastroenterologist:  Dr. Allen Norris  Pre-Procedure History & Physical: HPI:  Angelica Newton is a 70 y.o. female is here for an colonoscopy.   Past Medical History:  Diagnosis Date   Arthritis    Right hip   Chicken pox    Depression    Motion sickness    in past   PONV (postoperative nausea and vomiting)    UTI (urinary tract infection)    Wears hearing aid    bilateral    Past Surgical History:  Procedure Laterality Date   AUGMENTATION MAMMAPLASTY Bilateral 2007   mastopexy/breast lift   BREAST SURGERY  2005   Breast Lift   COLONOSCOPY WITH PROPOFOL N/A 09/17/2015   Procedure: COLONOSCOPY WITH PROPOFOL;  Surgeon: Lucilla Lame, MD;  Location: [place];  Service: Endoscopy;  Laterality: N/A;   ORIF ULNAR FRACTURE  1987   Broken Ulna/plates and screws   POLYPECTOMY  09/17/2015   Procedure: POLYPECTOMY INTESTINAL;  Surgeon: Lucilla Lame, MD;  Location: [place];  Service: Endoscopy;;  Sigmoid colon polyp   TOTAL HIP ARTHROPLASTY Right 03/09/2019   Procedure: TOTAL HIP ARTHROPLASTY;  Surgeon: Dereck Leep, MD;  Location: [facility];  Service: Orthopedics;  Laterality: Right;   TUBAL LIGATION      Prior to Admission medications   Medication Sig Start Date End Date Taking? Authorizing Provider  Cholecalciferol (VITAMIN D3) 50 MCG (2000 UT) TABS Take 2,000 Units by mouth every evening.   Yes [provider]  ezetimibe (ZETIA) 10 MG tablet TAKE 1 TABLET(10 MG) BY MOUTH DAILY 08/21/20  Yes Leone Haven, MD  Misc Natural Products (LUTEIN 20 PO) Take 20 mg by mouth every evening.   Yes [provider]  Multiple Minerals-Vitamins (CALCIUM-MAGNESIUM-ZINC-D3 PO) Take 2 tablets by mouth every evening.   Yes [provider]  Multiple Vitamin (MULTIVITAMIN WITH  MINERALS) TABS tablet Take 1 tablet by mouth every evening. Women's One A Day   Yes [provider]    Allergies as of 01/02/2021 - Review Complete 01/02/2021  Allergen Reaction Noted   Lipitor [atorvastatin]  05/10/2015   Tetracyclines & related Other (See Comments) 11/01/2014   Zocor [simvastatin] Other (See Comments) 11/01/2014   Mango butter Rash 11/01/2014    Family History  Problem Relation Age of Onset   Hyperlipidemia Mother    Stroke Mother    Diabetes Mother    Hyperlipidemia Father    Stroke Father    Hypertension Father    Kidney failure Father    Diabetes Father    Diabetes Brother    Cancer Brother        squamous cell carcinoma   Breast cancer Paternal Aunt    Diabetes Maternal Grandmother    Cancer Paternal Grandmother        Breast    Social History   Socioeconomic History   Marital status: Single    Spouse name: Not on file   Number of children: Not on file   Years of education: Not on file   Highest education level: Not on file  Occupational History   Not on file  Tobacco Use   Smoking status: Former    Years: 5.00    Types: Cigarettes    Quit date: 06/09/2010    Years since quitting: 10.6   Smokeless tobacco: Never  Tobacco comments:    only smoked 1 cig per day  Vaping Use   Vaping Use: Never used  Substance and Sexual Activity   Alcohol use: Yes    Alcohol/week: 10.0 standard drinks    Types: 10 Glasses of wine per week    Comment: occ   Drug use: No   Sexual activity: Yes    Partners: Male  Other Topics Concern   Not on file  Social History Narrative   Retired, but volunteers    Lives by herself   Children- 2 (68 and 82 yo)    Pets: None    Caffeine: 2 cups in the morning, diet coke rarely    Right handed    Enjoys bike riding, cooking, traveling    Social Determinants of Radio broadcast assistant Strain: Low Risk    Difficulty of Paying Living Expenses: Not hard at all  Food Insecurity: No Food Insecurity    Worried About Charity fundraiser in the Last Year: Never true   Arboriculturist in the Last Year: Never true  Transportation Needs: No Transportation Needs   Lack of Transportation (Medical): No   Lack of Transportation (Non-Medical): No  Physical Activity: Sufficiently Active   Days of Exercise per Week: 5 days   Minutes of Exercise per Session: 60 min  Stress: No Stress Concern Present   Feeling of Stress : Not at all  Social Connections: Unknown   Frequency of Communication with Friends and Family: More than three times a week   Frequency of Social Gatherings with Friends and Family: More than three times a week   Attends Religious Services: Not on Electrical engineer or Organizations: Yes   Attends Music therapist: More than 4 times per year   Marital Status: Not on file  Intimate Partner Violence: Not At Risk   Fear of Current or Ex-Partner: No   Emotionally Abused: No   Physically Abused: No   Sexually Abused: No    Review of Systems: See HPI, otherwise negative ROS  Physical Exam: BP 131/84   Pulse 64   Temp (!) 97.2 F (36.2 C) (Temporal)   Resp 17   Ht '5\' 9"'$  (1.753 m)   Wt 66.7 kg   SpO2 100%   BMI 21.71 kg/m  General:   Alert,  pleasant and cooperative in NAD Head:  Normocephalic and atraumatic. Neck:  Supple; no masses or thyromegaly. Lungs:  Clear throughout to auscultation.    Heart:  Regular rate and rhythm. Abdomen:  Soft, nontender and nondistended. Normal bowel sounds, without guarding, and without rebound.   Neurologic:  Alert and  oriented x4;  grossly normal neurologically.  Impression/Plan: Angelica Newton is here for an colonoscopy to be performed for a history of adenomatous polyps on 2017   Risks, benefits, limitations, and alternatives regarding  colonoscopy have been reviewed with the patient.  Questions have been answered.  All parties agreeable.   Lucilla Lame, MD  01/29/2021, 8:33 AM

## 2021-01-29 NOTE — Anesthesia Postprocedure Evaluation (Signed)
Anesthesia Post Note  Patient: Angelica Newton  Procedure(s) Performed: COLONOSCOPY WITH PROPOFOL  Patient location during evaluation: Phase II Anesthesia Type: General Level of consciousness: awake and alert, awake and oriented Pain management: pain level controlled Vital Signs Assessment: post-procedure vital signs reviewed and stable Respiratory status: spontaneous breathing, nonlabored ventilation and respiratory function stable Cardiovascular status: blood pressure returned to baseline and stable Postop Assessment: no apparent nausea or vomiting Anesthetic complications: no   No notable events documented.   Last Vitals:  Vitals:   01/29/21 0917 01/29/21 0927  BP: 122/72 134/66  Pulse: (!) 57 (!) 56  Resp: 11 14  Temp:    SpO2: 100% 100%    Last Pain:  Vitals:   01/29/21 0927  TempSrc:   PainSc: 0-No pain                 Phill Mutter

## 2021-01-30 ENCOUNTER — Encounter: Payer: Self-pay | Admitting: Gastroenterology

## 2021-01-30 LAB — SURGICAL PATHOLOGY

## 2021-01-31 ENCOUNTER — Encounter: Payer: Self-pay | Admitting: Gastroenterology

## 2021-05-02 DIAGNOSIS — Z20828 Contact with and (suspected) exposure to other viral communicable diseases: Secondary | ICD-10-CM | POA: Diagnosis not present

## 2021-07-19 DIAGNOSIS — Z20822 Contact with and (suspected) exposure to covid-19: Secondary | ICD-10-CM | POA: Diagnosis not present

## 2021-08-09 ENCOUNTER — Other Ambulatory Visit: Payer: Self-pay | Admitting: Family Medicine

## 2021-08-16 ENCOUNTER — Other Ambulatory Visit (INDEPENDENT_AMBULATORY_CARE_PROVIDER_SITE_OTHER): Payer: Medicare Other

## 2021-08-16 ENCOUNTER — Other Ambulatory Visit: Payer: Self-pay

## 2021-08-16 DIAGNOSIS — I10 Essential (primary) hypertension: Secondary | ICD-10-CM

## 2021-08-16 DIAGNOSIS — E785 Hyperlipidemia, unspecified: Secondary | ICD-10-CM | POA: Diagnosis not present

## 2021-08-16 LAB — CBC
HCT: 35.2 % — ABNORMAL LOW (ref 36.0–46.0)
Hemoglobin: 11.7 g/dL — ABNORMAL LOW (ref 12.0–15.0)
MCHC: 33.2 g/dL (ref 30.0–36.0)
MCV: 92.8 fl (ref 78.0–100.0)
Platelets: 259 10*3/uL (ref 150.0–400.0)
RBC: 3.8 Mil/uL — ABNORMAL LOW (ref 3.87–5.11)
RDW: 13.7 % (ref 11.5–15.5)
WBC: 4.6 10*3/uL (ref 4.0–10.5)

## 2021-08-16 LAB — LIPID PANEL
Cholesterol: 214 mg/dL — ABNORMAL HIGH (ref 0–200)
HDL: 69 mg/dL (ref >39.00)
LDL Cholesterol: 128 mg/dL — ABNORMAL HIGH (ref 0–99)
NonHDL: 144.84
Total CHOL/HDL Ratio: 3
Triglycerides: 83 mg/dL (ref 0.0–149.0)
VLDL: 16.6 mg/dL (ref 0.0–40.0)

## 2021-08-16 LAB — COMPREHENSIVE METABOLIC PANEL
ALT: 18 U/L (ref 0–35)
AST: 26 U/L (ref 0–37)
Albumin: 4.2 g/dL (ref 3.5–5.2)
Alkaline Phosphatase: 52 U/L (ref 39–117)
BUN: 17 mg/dL (ref 6–23)
CO2: 26 mEq/L (ref 19–32)
Calcium: 9.2 mg/dL (ref 8.4–10.5)
Chloride: 102 mEq/L (ref 96–112)
Creatinine, Ser: 0.82 mg/dL (ref 0.40–1.20)
GFR: 72.98 mL/min (ref >60.00)
Glucose, Bld: 90 mg/dL (ref 70–99)
Potassium: 4.4 mEq/L (ref 3.5–5.1)
Sodium: 136 mEq/L (ref 135–145)
Total Bilirubin: 0.9 mg/dL (ref 0.2–1.2)
Total Protein: 6.7 g/dL (ref 6.0–8.3)

## 2021-08-16 LAB — TSH: TSH: 2.29 u[IU]/mL (ref 0.35–5.50)

## 2021-08-21 ENCOUNTER — Other Ambulatory Visit: Payer: Self-pay

## 2021-08-21 ENCOUNTER — Encounter: Payer: Self-pay | Admitting: Family Medicine

## 2021-08-21 ENCOUNTER — Ambulatory Visit (INDEPENDENT_AMBULATORY_CARE_PROVIDER_SITE_OTHER): Payer: Medicare Other | Admitting: Family Medicine

## 2021-08-21 VITALS — BP 118/70 | HR 59 | Temp 98.6°F | Ht 68.0 in | Wt 151.0 lb

## 2021-08-21 DIAGNOSIS — M810 Age-related osteoporosis without current pathological fracture: Secondary | ICD-10-CM

## 2021-08-21 DIAGNOSIS — Z1329 Encounter for screening for other suspected endocrine disorder: Secondary | ICD-10-CM

## 2021-08-21 DIAGNOSIS — D649 Anemia, unspecified: Secondary | ICD-10-CM | POA: Diagnosis not present

## 2021-08-21 DIAGNOSIS — Z0000 Encounter for general adult medical examination without abnormal findings: Secondary | ICD-10-CM

## 2021-08-21 DIAGNOSIS — E785 Hyperlipidemia, unspecified: Secondary | ICD-10-CM

## 2021-08-21 DIAGNOSIS — I10 Essential (primary) hypertension: Secondary | ICD-10-CM | POA: Diagnosis not present

## 2021-08-21 DIAGNOSIS — Z1231 Encounter for screening mammogram for malignant neoplasm of breast: Secondary | ICD-10-CM | POA: Diagnosis not present

## 2021-08-21 DIAGNOSIS — Z130 Encounter for screening for diseases of the blood and blood-forming organs and certain disorders involving the immune mechanism: Secondary | ICD-10-CM | POA: Diagnosis not present

## 2021-08-21 MED ORDER — ROSUVASTATIN CALCIUM 5 MG PO TABS: 5.0000 mg | ORAL_TABLET | Freq: Every day | ORAL | 3 refills | Status: DC

## 2021-08-21 NOTE — Assessment & Plan Note (Signed)
Patient is due for bone density scan.  She will call to get this scheduled.  She will resume her vitamin D.  Discussed that she could take a calcium supplement that does not have magnesium in it as I suspect the magnesium is leading to her loose stools. ?

## 2021-08-21 NOTE — Patient Instructions (Signed)
Nice to see you. ?We will start you on Crestor 5 mg once daily for your cholesterol.  We will recheck labs in 6 weeks. ?Please call [phone] to schedule your mammogram and bone density scan. ?

## 2021-08-21 NOTE — Progress Notes (Signed)
?Tommi Rumps, MD ?Phone: [phone] ? ?Angelica Newton is a 71 y.o. female who presents today for f/u. ? ?HYPERLIPIDEMIA ?Symptoms ?Chest pain on exertion:  no   Leg claudication:   no ?Medications: ?Compliance- taking zetia Right upper quadrant pain- no  Muscle aches- no ?Has taken simvastatin and lipitor in the past. Noted she felt ill while taking them.  ?Lipid Panel  ?   ?Component Value Date/Time  ? CHOL 214 (H) 08/16/2021 0857  ? TRIG 83.0 08/16/2021 0857  ? HDL 69.00 08/16/2021 0857  ? CHOLHDL 3 08/16/2021 0857  ? VLDL 16.6 08/16/2021 0857  ? [place] 128 (H) 08/16/2021 0857  ? ?Osteoporosis: Patient is due for DEXA scan.  She stopped her calcium and vitamin D several weeks ago as she was having some loose stools.  She notes the calcium supplement had a magnesium supplement with that as well.  The loose stools went away when she stopped these things. ? ?Anemia: She notes no vaginal bleeding, rectal bleeding, or blood in her urine.  She did get blood last month. ? ?Patient notes she spins 3 times a week and walks her dog 3 to 4 miles a day.  She avoids red meat and nuts.  She has chicken and spinach and eggs.  She has yogurt and granola and fruit. ? ?Social History  ? ?Tobacco Use  ?Smoking Status Former  ? Years: 5.00  ? Types: Cigarettes  ? Quit date: 06/09/2010  ? Years since quitting: 11.2  ?Smokeless Tobacco Never  ?Tobacco Comments  ? only smoked 1 cig per day  ? ? ?Current Outpatient Medications on File Prior to Visit  ?Medication Sig Dispense Refill  ? Cholecalciferol (VITAMIN D3) 50 MCG (2000 UT) TABS Take 2,000 Units by mouth every evening.    ? ezetimibe (ZETIA) 10 MG tablet TAKE 1 TABLET(10 MG) BY MOUTH DAILY 90 tablet 3  ? Misc Natural Products (LUTEIN 20 PO) Take 20 mg by mouth every evening.    ? Multiple Minerals-Vitamins (CALCIUM-MAGNESIUM-ZINC-D3 PO) Take 2 tablets by mouth every evening.    ? Multiple Vitamin (MULTIVITAMIN WITH MINERALS) TABS tablet Take 1 tablet by mouth every  evening. Women's One A Day    ? ?No current facility-administered medications on file prior to visit.  ? ? ? ?ROS  ?General:  Negative for nexplained weight loss, fever ?Skin: Negative for new or changing mole, sore that won't heal ?HEENT: Negative for trouble hearing, trouble seeing, ringing in ears, mouth sores, hoarseness, change in voice, dysphagia. ?CV:  Negative for chest pain, dyspnea, edema, palpitations ?Resp: Negative for cough, dyspnea, hemoptysis ?GI: Negative for nausea, vomiting, diarrhea, constipation, abdominal pain, melena, hematochezia. ?GU: Negative for dysuria, incontinence, urinary hesitance, hematuria, vaginal or penile discharge, polyuria, sexual difficulty, lumps in testicle or breasts ?MSK: Negative for muscle cramps or aches, joint pain or swelling ?Neuro: Negative for headaches, weakness, numbness, dizziness, passing out/fainting ?Psych: Negative for depression, anxiety, memory problems ? ? ?Objective ? ?Physical Exam ?Vitals:  ? 08/21/21 0850  ?BP: 118/70  ?Pulse: (!) 59  ?Temp: 98.6 ?F (37 ?C)  ?SpO2: 99%  ? ? ?BP Readings from Last 3 Encounters:  ?08/21/21 118/70  ?01/29/21 134/66  ?11/14/20 90/60  ? ?Wt Readings from Last 3 Encounters:  ?08/21/21 151 lb (68.5 kg)  ?01/29/21 147 lb (66.7 kg)  ?01/21/21 148 lb (67.1 kg)  ? ? ?Physical Exam ?Constitutional:   ?   General: She is not in acute distress. ?   Appearance: She is not  diaphoretic.  ?Cardiovascular:  ?   Rate and Rhythm: Normal rate and regular rhythm.  ?   Heart sounds: Normal heart sounds.  ?Pulmonary:  ?   Effort: Pulmonary effort is normal.  ?   Breath sounds: Normal breath sounds.  ?Abdominal:  ?   General: Bowel sounds are normal. There is no distension.  ?   Palpations: Abdomen is soft.  ?   Tenderness: There is no abdominal tenderness.  ?Skin: ?   General: Skin is warm and dry.  ?Neurological:  ?   Mental Status: She is alert.  ?Psychiatric:     ?   Mood and Affect: Mood normal.  ? ? ? ?Assessment/Plan: Please see  individual problem list. ? ?Problem List Items Addressed This Visit   ? ? Anemia  ?  Found to be anemic on her lab work.  We will recheck this in 6 weeks.  I suspect this is related to her donating blood. ?  ?  ? Relevant Orders  ? CBC  ? Hyperlipidemia - Primary  ?  Above goal.  We will trial Crestor 5 mg once daily in addition to her Zetia 10 mg once daily.  We will recheck labs in 6 weeks.  If she has any side effects with the Crestor she will discontinue it and let us know.  She will continue healthy diet and exercise. ?  ?  ? Relevant Medications  ? rosuvastatin (CRESTOR) 5 MG tablet  ? Other Relevant Orders  ? Lipid panel (Completed)  ? LDL cholesterol, direct  ? Hepatic function panel  ? Osteoporosis  ?  Patient is due for bone density scan.  She will call to get this scheduled.  She will resume her vitamin D.  Discussed that she could take a calcium supplement that does not have magnesium in it as I suspect the magnesium is leading to her loose stools. ?  ?  ? Relevant Orders  ? DG Bone Density  ? ?Other Visit Diagnoses   ? ? Essential hypertension      ? Relevant Medications  ? rosuvastatin (CRESTOR) 5 MG tablet  ? Other Relevant Orders  ? Comp Met (CMET) (Completed)  ? Encounter for screening mammogram for malignant neoplasm of breast      ? Relevant Orders  ? MM 3D SCREEN BREAST BILATERAL  ? Screening for deficiency anemia      ? Relevant Orders  ? CBC (Completed)  ? Thyroid disorder screening      ? Relevant Orders  ? TSH (Completed)  ? ?  ? ? ? ?Health Maintenance: Patient will call to schedule her mammogram as well.  I also encouraged the patient to get the updated COVID booster. ? ?Return in about 6 weeks (around 10/02/2021) for labs, 6 months follow-up. ? ?This visit occurred during the SARS-CoV-2 public health emergency.  Safety protocols were in place, including screening questions prior to the visit, additional usage of staff PPE, and extensive cleaning of exam room while observing appropriate  contact time as indicated for disinfecting solutions.  ? ? ?Tommi Rumps, MD ?[place] ? ?

## 2021-08-21 NOTE — Assessment & Plan Note (Signed)
Found to be anemic on her lab work.  We will recheck this in 6 weeks.  I suspect this is related to her donating blood. ?

## 2021-08-21 NOTE — Assessment & Plan Note (Signed)
Above goal.  We will trial Crestor 5 mg once daily in addition to her Zetia 10 mg once daily.  We will recheck labs in 6 weeks.  If she has any side effects with the Crestor she will discontinue it and let us know.  She will continue healthy diet and exercise. ?

## 2021-09-23 DIAGNOSIS — Z20822 Contact with and (suspected) exposure to covid-19: Secondary | ICD-10-CM | POA: Diagnosis not present

## 2021-10-02 ENCOUNTER — Other Ambulatory Visit (INDEPENDENT_AMBULATORY_CARE_PROVIDER_SITE_OTHER): Payer: Medicare Other

## 2021-10-02 DIAGNOSIS — D649 Anemia, unspecified: Secondary | ICD-10-CM | POA: Diagnosis not present

## 2021-10-02 DIAGNOSIS — E785 Hyperlipidemia, unspecified: Secondary | ICD-10-CM | POA: Diagnosis not present

## 2021-10-02 LAB — HEPATIC FUNCTION PANEL
ALT: 23 U/L (ref 0–35)
AST: 28 U/L (ref 0–37)
Albumin: 4.4 g/dL (ref 3.5–5.2)
Alkaline Phosphatase: 56 U/L (ref 39–117)
Bilirubin, Direct: 0.2 mg/dL (ref 0.0–0.3)
Total Bilirubin: 0.9 mg/dL (ref 0.2–1.2)
Total Protein: 7.1 g/dL (ref 6.0–8.3)

## 2021-10-02 LAB — CBC
HCT: 38.9 % (ref 36.0–46.0)
Hemoglobin: 12.7 g/dL (ref 12.0–15.0)
MCHC: 32.8 g/dL (ref 30.0–36.0)
MCV: 93.2 fl (ref 78.0–100.0)
Platelets: 243 10*3/uL (ref 150.0–400.0)
RBC: 4.17 Mil/uL (ref 3.87–5.11)
RDW: 13.5 % (ref 11.5–15.5)
WBC: 4.6 10*3/uL (ref 4.0–10.5)

## 2021-10-02 LAB — LDL CHOLESTEROL, DIRECT: Direct LDL: 92 mg/dL

## 2021-10-31 ENCOUNTER — Ambulatory Visit
Admission: RE | Admit: 2021-10-31 | Discharge: 2021-10-31 | Disposition: A | Discharge status: Home / Self Care | Payer: Medicare Other | Source: Ambulatory Visit | Attending: Family Medicine | Admitting: Family Medicine

## 2021-10-31 DIAGNOSIS — M810 Age-related osteoporosis without current pathological fracture: Secondary | ICD-10-CM

## 2021-10-31 DIAGNOSIS — Z1231 Encounter for screening mammogram for malignant neoplasm of breast: Secondary | ICD-10-CM

## 2021-10-31 DIAGNOSIS — Z780 Asymptomatic menopausal state: Secondary | ICD-10-CM | POA: Diagnosis not present

## 2021-11-26 ENCOUNTER — Encounter: Payer: Self-pay | Admitting: Family Medicine

## 2021-11-26 ENCOUNTER — Ambulatory Visit (INDEPENDENT_AMBULATORY_CARE_PROVIDER_SITE_OTHER): Payer: Medicare Other | Admitting: Family Medicine

## 2021-11-26 DIAGNOSIS — I447 Left bundle-branch block, unspecified: Secondary | ICD-10-CM

## 2021-11-26 DIAGNOSIS — M810 Age-related osteoporosis without current pathological fracture: Secondary | ICD-10-CM

## 2021-11-26 NOTE — Progress Notes (Signed)
  Tommi Rumps, MD Phone: [phone]  Angelica Newton is a 71 y.o. female who presents today for f/u.  Osteoporosis: Patient had a recent bone density scan that revealed a T score of -2.5.  She is hesitant to start on medication given side effects that a number of women have had that she knows.  She takes 2000 international units of vitamin D daily.  She does not take any calcium the does get dairy products and leafy greens most days.  She does weightbearing exercises and walks 3 to 5 miles a day with her dogs.  Social History   Tobacco Use  Smoking Status Former   Years: 5.00   Types: Cigarettes   Quit date: 06/09/2010   Years since quitting: 11.4  Smokeless Tobacco Never  Tobacco Comments   only smoked 1 cig per day    Current Outpatient Medications on File Prior to Visit  Medication Sig Dispense Refill   Cholecalciferol (VITAMIN D3) 50 MCG (2000 UT) TABS Take 2,000 Units by mouth every evening.     ezetimibe (ZETIA) 10 MG tablet TAKE 1 TABLET(10 MG) BY MOUTH DAILY 90 tablet 3   rosuvastatin (CRESTOR) 5 MG tablet Take 1 tablet (5 mg total) by mouth daily. 90 tablet 3   No current facility-administered medications on file prior to visit.     ROS see history of present illness  Objective  Physical Exam Vitals:   11/26/21 0802  BP: 130/80  Pulse: (!) 57  Temp: 98.5 F (36.9 C)  SpO2: 99%    BP Readings from Last 3 Encounters:  11/26/21 130/80  08/21/21 118/70  01/29/21 134/66   Wt Readings from Last 3 Encounters:  11/26/21 153 lb (69.4 kg)  08/21/21 151 lb (68.5 kg)  01/29/21 147 lb (66.7 kg)    Physical Exam Constitutional:      General: She is not in acute distress.    Appearance: She is not diaphoretic.  Neurological:     Mental Status: She is alert.      Assessment/Plan: Please see individual problem list.  Problem List Items Addressed This Visit     Left bundle branch block (LBBB) (Chronic)    Discussed this diagnosis with her.   Advised that there is not anything to do for this at this time as she has already seen cardiology and had a negative stress test.      Osteoporosis (Chronic)    Patient's T score is actually slightly better than it was previously.  We did discuss Fosamax, Prolia, and Reclast.  The patient is hesitant to start on any medications given potential side effects.  She will continue vitamin D 2000 international units daily.  She will figure out how much calcium she is getting in her diet and if needed she will add supplementation and to get a total of 1200 mg of calcium daily.  She will continue weightbearing exercise as well.  Discussed we recheck her bone density scan in 2 years.      Return for As scheduled for yearly physical.   Tommi Rumps, MD [place]

## 2021-11-26 NOTE — Progress Notes (Signed)
Follow

## 2021-11-26 NOTE — Assessment & Plan Note (Signed)
Discussed this diagnosis with her.  Advised that there is not anything to do for this at this time as she has already seen cardiology and had a negative stress test.

## 2021-11-26 NOTE — Assessment & Plan Note (Signed)
Patient's T score is actually slightly better than it was previously.  We did discuss Fosamax, Prolia, and Reclast.  The patient is hesitant to start on any medications given potential side effects.  She will continue vitamin D 2000 international units daily.  She will figure out how much calcium she is getting in her diet and if needed she will add supplementation and to get a total of 1200 mg of calcium daily.  She will continue weightbearing exercise as well.  Discussed we recheck her bone density scan in 2 years.

## 2021-11-29 DIAGNOSIS — D225 Melanocytic nevi of trunk: Secondary | ICD-10-CM | POA: Diagnosis not present

## 2021-11-29 DIAGNOSIS — D2271 Melanocytic nevi of right lower limb, including hip: Secondary | ICD-10-CM | POA: Diagnosis not present

## 2021-11-29 DIAGNOSIS — D2262 Melanocytic nevi of left upper limb, including shoulder: Secondary | ICD-10-CM | POA: Diagnosis not present

## 2021-11-29 DIAGNOSIS — D2272 Melanocytic nevi of left lower limb, including hip: Secondary | ICD-10-CM | POA: Diagnosis not present

## 2021-11-29 DIAGNOSIS — D2261 Melanocytic nevi of right upper limb, including shoulder: Secondary | ICD-10-CM | POA: Diagnosis not present

## 2021-11-29 DIAGNOSIS — L821 Other seborrheic keratosis: Secondary | ICD-10-CM | POA: Diagnosis not present

## 2022-01-28 DIAGNOSIS — H353131 Nonexudative age-related macular degeneration, bilateral, early dry stage: Secondary | ICD-10-CM | POA: Diagnosis not present

## 2022-01-28 DIAGNOSIS — H2513 Age-related nuclear cataract, bilateral: Secondary | ICD-10-CM | POA: Diagnosis not present

## 2022-01-28 DIAGNOSIS — H524 Presbyopia: Secondary | ICD-10-CM | POA: Diagnosis not present

## 2022-01-29 ENCOUNTER — Ambulatory Visit (INDEPENDENT_AMBULATORY_CARE_PROVIDER_SITE_OTHER): Payer: Medicare Other

## 2022-01-29 VITALS — Ht 68.0 in | Wt 153.0 lb

## 2022-01-29 DIAGNOSIS — Z0000 Encounter for general adult medical examination without abnormal findings: Secondary | ICD-10-CM

## 2022-01-29 NOTE — Patient Instructions (Addendum)
  Angelica Newton , Thank you for taking time to come for your Medicare Wellness Visit. I appreciate your ongoing commitment to your health goals. Please review the following plan we discussed and let me know if I can assist you in the future.   These are the goals we discussed:  Goals       Patient Stated     Maintain Healthy Lifestyle (pt-stated)      Healthy diet Staying active        This is a list of the screening recommended for you and due dates:  Health Maintenance  Topic Date Due   COVID-19 Vaccine (6 - Pfizer series) 02/14/2022*   Flu Shot  09/07/2022*   Tetanus Vaccine  06/10/2023   Mammogram  11/01/2023   Colon Cancer Screening  01/30/2028   Pneumonia Vaccine  Completed   DEXA scan (bone density measurement)  Completed   Hepatitis C Screening: USPSTF Recommendation to screen - Ages 10-79 yo.  Completed   Zoster (Shingles) Vaccine  Completed   HPV Vaccine  Aged Out  *Topic was postponed. The date shown is not the original due date.

## 2022-01-29 NOTE — Progress Notes (Signed)
Subjective:   Angelica Newton is a 71 y.o. female who presents for Medicare Annual (Subsequent) preventive examination.  Review of Systems    No ROS.  Medicare Wellness Virtual Visit.  Visual/audio telehealth visit, UTA vital signs.   See social history for additional risk factors.   Cardiac Risk Factors include: advanced age (>76mn, >>68women)     Objective:    Today's Vitals   01/29/22 1328  Weight: 153 lb (69.4 kg)  Height: '5\' 8"'$  (1.727 m)   Body mass index is 23.26 kg/m.     01/29/2022    1:19 PM 01/29/2021    8:18 AM 01/21/2021    3:42 PM 09/04/2020    3:35 PM 12/26/2019    9:46 AM 03/09/2019   12:03 PM 03/09/2019    6:38 AM  Advanced Directives  Does Patient Have a Medical Advance Directive? Yes No Yes Yes Yes Yes Yes  Type of AParamedicof ABalticLiving will  [place] will  HDu [place] will Living will;Healthcare Power of [place]Living will  Does patient want to make changes to medical advance directive? No - Patient declined  No - Patient declined No - Patient declined No - Patient declined No - Patient declined   Copy of HVictoriain Chart? Yes - validated most recent copy scanned in chart (See row information)  Yes - validated most recent copy scanned in chart (See row information)  Yes - validated most recent copy scanned in chart (See row information)  Yes - validated most recent copy scanned in chart (See row information)  Would patient like information on creating a medical advance directive?  No - Patient declined         Current Medications (verified) Outpatient Encounter Medications as of 01/29/2022  Medication Sig   Cholecalciferol (VITAMIN D3) 50 MCG (2000 UT) TABS Take 2,000 Units by mouth every evening.   ezetimibe (ZETIA) 10 MG tablet TAKE 1 TABLET(10 MG) BY MOUTH DAILY   rosuvastatin (CRESTOR) 5 MG tablet Take 1 tablet (5  mg total) by mouth daily.   No facility-administered encounter medications on file as of 01/29/2022.    Allergies (verified) Lipitor [atorvastatin], Tetracyclines & related, Zocor [simvastatin], and Mango butter   History: Past Medical History:  Diagnosis Date   Arthritis    Right hip   Chicken pox    Depression    History of colonic polyps    Motion sickness    in past   PONV (postoperative nausea and vomiting)    UTI (urinary tract infection)    Wears hearing aid    bilateral   Past Surgical History:  Procedure Laterality Date   AUGMENTATION MAMMAPLASTY Bilateral 2007   mastopexy/breast lift   BREAST SURGERY  2005   Breast Lift   COLONOSCOPY WITH PROPOFOL N/A 09/17/2015   Procedure: COLONOSCOPY WITH PROPOFOL;  Surgeon: DLucilla Lame MD;  [place]  Service: [place]oscopy;  Laterality: N/A;   COLONOSCOPY WITH PROPOFOL N/A 01/29/2021   Procedure: COLONOSCOPY WITH PROPOFOL;  Surgeon: WLucilla Lame MD;  [facility]  Service: Endoscopy;  Laterality: N/A;   ORIF ULNAR FRACTURE  1987   Broken Ulna/plates and screws   POLYPECTOMY  09/17/2015   Procedure: POLYPECTOMY INTESTINAL;  Surgeon: DLucilla Lame MD;  [place]  Service: [place]oscopy;;  Sigmoid colon polyp   TOTAL HIP ARTHROPLASTY Right 03/09/2019   Procedure: TOTAL HIP ARTHROPLASTY;  Surgeon:  Hooten, Laurice Record, MD;  Location: [facility];  Service: Orthopedics;  Laterality: Right;   TUBAL LIGATION     Family History  Problem Relation Age of Onset   Hyperlipidemia Mother    Stroke Mother    Diabetes Mother    Hyperlipidemia Father    Stroke Father    Hypertension Father    Kidney failure Father    Diabetes Father    Diabetes Brother    Cancer Brother        squamous cell carcinoma   Breast cancer Paternal Aunt    Diabetes Maternal Grandmother    Cancer Paternal Grandmother        Breast   Social History   Socioeconomic History   Marital status: Single    Spouse name:  Not on file   Number of children: Not on file   Years of education: Not on file   Highest education level: Not on file  Occupational History   Not on file  Tobacco Use   Smoking status: Former    Years: 5.00    Types: Cigarettes    Quit date: 06/09/2010    Years since quitting: 11.6   Smokeless tobacco: Never   Tobacco comments:    only smoked 1 cig per day  Vaping Use   Vaping Use: Never used  Substance and Sexual Activity   Alcohol use: Yes    Alcohol/week: 10.0 standard drinks of alcohol    Types: 10 Glasses of wine per week    Comment: occ   Drug use: No   Sexual activity: Yes    Partners: Male  Other Topics Concern   Not on file  Social History Narrative   Retired, but volunteers    Lives by herself   Children- 2 (98 and 48 yo)    Pets: None    Caffeine: 2 cups in the morning, diet coke rarely    Right handed    Enjoys bike riding, cooking, traveling    Social Determinants of Health   Financial Resource Strain: Low Risk  (01/29/2022)   Overall Financial Resource Strain (CARDIA)    Difficulty of Paying Living Expenses: Not hard at all  Food Insecurity: No Food Insecurity (01/29/2022)   Hunger Vital Sign    Worried About Running Out of Food in the Last Year: Never true    [place] in the Last Year: Never true  Transportation Needs: No Transportation Needs (01/29/2022)   PRAPARE - Hydrologist (Medical): No    Lack of Transportation (Non-Medical): No  Physical Activity: Sufficiently Active (01/29/2022)   Exercise Vital Sign    Days of Exercise per Week: 5 days    Minutes of Exercise per Session: 60 min  Stress: No Stress Concern Present (01/29/2022)   [place]    Feeling of Stress : Not at all  Social Connections: Unknown (01/29/2022)   Social Connection and Isolation Panel [NHANES]    Frequency of Communication with Friends and Family: More than three times a  week    Frequency of Social Gatherings with Friends and Family: More than three times a week    Attends Religious Services: Not on Advertising copywriter or Organizations: Yes    Attends Archivist Meetings: More than 4 times per year    Marital Status: Not on file    Tobacco Counseling Counseling given: Not Answered Tobacco comments:  only smoked 1 cig per day   Clinical Intake:  Pre-visit preparation completed: Yes        Diabetes: No  How often do you need to have someone help you when you read instructions, pamphlets, or other written materials from your doctor or pharmacy?: 1 - Never   Interpreter Needed?: No      Activities of Daily Living    01/29/2022    1:20 PM  In your present state of health, do you have any difficulty performing the following activities:  Hearing? 1  Comment Hearing aids  Vision? 0  Difficulty concentrating or making decisions? 0  Walking or climbing stairs? 0  Dressing or bathing? 0  Doing errands, shopping? 0  Preparing Food and eating ? N  Using the Toilet? N  In the past six months, have you accidently leaked urine? N  Do you have problems with loss of bowel control? N  Managing your Medications? N  Managing your Finances? N  Housekeeping or managing your Housekeeping? N    Patient Care Team: Angelica Haven, MD as PCP - General (Family Medicine)  Indicate any recent Medical Services you may have received from other than [facility] providers in the past year (date may be approximate).     Assessment:   This is a routine wellness examination for Angelica Newton.  Virtual Visit via Telephone Note  I connected with  Angelica Newton on 01/29/22 at  1:15 PM EDT by telephone and verified that I am speaking with the correct person using two identifiers.  Location: Patient: home Provider: office Persons participating in the virtual visit: patient/Nurse Health Advisor   I discussed the limitations of performing  an evaluation and management service by telehealth. We continued and completed visit with audio only. Some vital signs may be absent or patient reported.   Hearing/Vision screen Hearing Screening - Comments:: Followed by [facility] Hearing aids  Vision Screening - Comments:: Followed by [facility]  Wears corrective lenses  They have seen their ophthalmologist in the last 12 months.   Dietary issues and exercise activities discussed: Current Exercise Habits: Home exercise routine, Type of exercise: walking (Spin class), Time (Minutes): 60, Frequency (Times/Week): 5, Weekly Exercise (Minutes/Week): 300, Intensity: Mild Low carb/high protein/gluten free diet Good water intake   Goals Addressed               This Visit's Progress     Patient Stated     Maintain Healthy Lifestyle (pt-stated)        Healthy diet Staying active       Depression Screen    01/29/2022    1:24 PM 11/26/2021    8:04 AM 08/21/2021    8:52 AM 01/21/2021    3:39 PM 08/21/2020   10:31 AM 12/26/2019    9:43 AM 08/08/2019    9:13 AM  PHQ 2/9 Scores  PHQ - 2 Score 0 0 0 0 0 0 0    Fall Risk    01/29/2022    1:27 PM 11/26/2021    8:03 AM 08/21/2021    8:51 AM 01/21/2021    3:43 PM 11/14/2020    9:14 AM  [place] in the past year? 0 0 0 0 0  Number falls in past yr: 0 0 0  0  Injury with Fall?  0 0 0   Risk for fall due to :  No Fall Risks No Fall Risks    Follow up Falls evaluation  completed Falls evaluation completed Falls evaluation completed Falls evaluation completed Falls evaluation completed    FALL RISK PREVENTION PERTAINING TO THE HOME: Home free of loose throw rugs in walkways, pet beds, electrical cords, etc? Yes  Adequate lighting in your home to reduce risk of falls? Yes   ASSISTIVE DEVICES UTILIZED TO PREVENT FALLS: Life/phone alert? Yes  Use of a cane, walker or w/c? No  Grab bars in the bathroom? No  Shower chair or bench in shower? No  Comfort chair height toilet? Yes    TIMED UP AND GO: Was the test performed? No .   Cognitive Function:  Patient is alert and oriented x3.  Enjoys brain health stimulating activities.  Manages her own medications and finances.  100% independent.       01/29/2022    1:26 PM 12/26/2019    9:51 AM 12/24/2018    9:55 AM  6CIT Screen  What Year?  0 points 0 points  What month?  0 points 0 points  What time?  0 points 0 points  Count back from 20   0 points  Months in reverse 0 points  0 points  Repeat phrase   0 points  Total Score   0 points    Immunizations Immunization History  Administered Date(s) Administered   Influenza Split 07/03/2014, 04/17/2020   Influenza, High Dose Seasonal PF 05/10/2018   Influenza,inj,Quad PF,6+ Mos 05/10/2015   PFIZER Comirnaty(Gray Top)Covid-19 Tri-Sucrose Vaccine 07/27/2019, 08/17/2019   PFIZER(Purple Top)SARS-COV-2 Vaccination 07/27/2019, 08/17/2019, 04/17/2020   Pneumococcal Conjugate-13 05/08/2017   Pneumococcal Polysaccharide-23 11/14/2020   Zoster Recombinat (Shingrix) 02/25/2021, 07/15/2021   Screening Tests Health Maintenance  Topic Date Due   COVID-19 Vaccine (6 - Pfizer series) 02/14/2022 (Originally 06/12/2020)   INFLUENZA VACCINE  09/07/2022 (Originally 01/07/2022)   TETANUS/TDAP  06/10/2023   MAMMOGRAM  11/01/2023   COLONOSCOPY (Pts 45-6yr Insurance coverage will need to be confirmed)  01/30/2028   Pneumonia Vaccine 70 Years old  Completed   DEXA SCAN  Completed   Hepatitis C Screening  Completed   Zoster Vaccines- Shingrix  Completed   HPV VACCINES  Aged Out   Health Maintenance There are no preventive care reminders to display for this patient.  Lung Cancer Screening: (Low Dose CT Chest recommended if Age 857-80years, 30 pack-year currently smoking OR have quit w/in 15years.) does not qualify.   Vision Screening: Recommended annual ophthalmology exams for early detection of glaucoma and other disorders of the eye.  Dental Screening: Recommended annual  dental exams for proper oral hygiene  Community Resource Referral / Chronic Care Management: CRR required this visit?  No   CCM required this visit?  No      Plan:     I have personally reviewed and noted the following in the patient's chart:   Medical and social history Use of alcohol, tobacco or illicit drugs  Current medications and supplements including opioid prescriptions. Patient is not currently taking opioid prescriptions. Functional ability and status Nutritional status Physical activity Advanced directives List of other physicians Hospitalizations, surgeries, and ER visits in previous 12 months Vitals Screenings to include cognitive, depression, and falls Referrals and appointments  In addition, I have reviewed and discussed with patient certain preventive protocols, quality metrics, and best practice recommendations. A written personalized care plan for preventive services as well as general preventive health recommendations were provided to patient.     OVarney Biles LPN   [id number]

## 2022-02-19 ENCOUNTER — Telehealth: Payer: Self-pay | Admitting: Family Medicine

## 2022-02-19 NOTE — Telephone Encounter (Signed)
Noted.  We can try pravastatin 10 mg daily and see if she is able to tolerate that.  She would need labs 6 weeks after starting the medication.  I believe she has an appointment in October and we could plan on checking her labs then.

## 2022-02-19 NOTE — Telephone Encounter (Signed)
Patient called and stated she stopped taking her rosuvastatin (CRESTOR) 5 MG tablet, it made her body hurt. She was wondering if there is something else she could take.

## 2022-02-20 MED ORDER — PRAVASTATIN SODIUM 10 MG PO TABS: 10.0000 mg | ORAL_TABLET | Freq: Every day | ORAL | 1 refills | Status: DC

## 2022-02-20 NOTE — Telephone Encounter (Signed)
Sent to pharmacy 

## 2022-02-20 NOTE — Addendum Note (Signed)
Addended by: Caryl Bis Lucille Crichlow G on: 02/20/2022 11:54 AM   Modules accepted: Orders

## 2022-02-21 ENCOUNTER — Ambulatory Visit: Payer: Medicare Other | Admitting: Family Medicine

## 2022-04-07 ENCOUNTER — Ambulatory Visit (INDEPENDENT_AMBULATORY_CARE_PROVIDER_SITE_OTHER): Payer: Medicare Other | Admitting: Family Medicine

## 2022-04-07 ENCOUNTER — Encounter: Payer: Self-pay | Admitting: Family Medicine

## 2022-04-07 VITALS — BP 120/70 | HR 57 | Temp 97.5°F | Ht 68.0 in | Wt 153.2 lb

## 2022-04-07 DIAGNOSIS — Z23 Encounter for immunization: Secondary | ICD-10-CM | POA: Diagnosis not present

## 2022-04-07 DIAGNOSIS — R002 Palpitations: Secondary | ICD-10-CM

## 2022-04-07 DIAGNOSIS — E785 Hyperlipidemia, unspecified: Secondary | ICD-10-CM

## 2022-04-07 NOTE — Patient Instructions (Signed)
Nice to see you. We are going to get some lab work today and contact with the results. I am sending a message to cardiology and we will see what they think about the accuracy of the [place]. We will also refer you to cardiology.

## 2022-04-07 NOTE — Progress Notes (Addendum)
Eric Sonnenberg, MD Phone: [phone]  Angelica Newton is a 71 y.o. female who presents today for follow-up.  Palpitations: patient reports two nights where she woke up from sleep feeling palpitations in her chest. Her Apple Watch showed a heart rhythm of atrial fibrillation a couple of times in the nights where she felt the palpitations. Per patient her heart rates from her Apple Watch are anywhere from 120s to 130s during these episodes. She denies shortness of breath, chest pain, and leg swelling.  Hyperlipidemia: patient takes her ezetimibe and pravastatin daily. Denies myalgias and RUQ pain.   Social History   Tobacco Use  Smoking Status Former   Years: 5.00   Types: Cigarettes   Quit date: 06/09/2010   Years since quitting: 11.8  Smokeless Tobacco Never  Tobacco Comments   only smoked 1 cig per day    Current Outpatient Medications on File Prior to Visit  Medication Sig Dispense Refill   Cholecalciferol (VITAMIN D3) 50 MCG (2000 UT) TABS Take 2,000 Units by mouth every evening.     ezetimibe (ZETIA) 10 MG tablet TAKE 1 TABLET(10 MG) BY MOUTH DAILY 90 tablet 3   pravastatin (PRAVACHOL) 10 MG tablet Take 1 tablet (10 mg total) by mouth daily. 90 tablet 1   No current facility-administered medications on file prior to visit.     ROS see history of present illness  Objective  Vitals:   04/07/22 1448  BP: 120/70  Pulse: (!) 57  Temp: (!) 97.5 F (36.4 C)  SpO2: 98%    BP Readings from Last 3 Encounters:  04/07/22 120/70  11/26/21 130/80  08/21/21 118/70   Wt Readings from Last 3 Encounters:  04/07/22 153 lb 3.2 oz (69.5 kg)  01/29/22 153 lb (69.4 kg)  11/26/21 153 lb (69.4 kg)    Physical Exam Constitutional:      Appearance: Normal appearance.  HENT:     Head: Normocephalic and atraumatic.  Cardiovascular:     Rate and Rhythm: Normal rate and regular rhythm.  Pulmonary:     Effort: Pulmonary effort is normal.     Breath sounds: Normal breath  sounds.  Skin:    General: Skin is warm and dry.  Neurological:     Mental Status: She is alert.  Psychiatric:        Mood and Affect: Mood normal.   EKG: sinus bradycardia rate 54 with left bundle branch block   Assessment/Plan: Please see individual problem list.  Problem List Items Addressed This Visit     Hyperlipidemia (Chronic)    Stable. Will get updated LDL and CMP. Continue pravastatin 10 mg daily and zetia 10 mg daily.       Relevant Orders   Direct LDL   Comp Met (CMET)   Palpitations - Primary    EKG shows sinus bradycardia in office today. Will refer to cardiology for further workup and accuracy of Apple Watch heart rhythm readings. Check TSH and CBC. Message sent to cardiology to see what they thought about the apple watch reading afib. Patient advised to call us in one week if she has not heard a response from cardiology.       Relevant Orders   EKG 12-Lead (Completed)   TSH   CBC   Ambulatory referral to Cardiology   Other Visit Diagnoses     Need for immunization against influenza       Relevant Orders   Flu Vaccine QUAD High Dose(Fluad) (Completed)          Return in about 6 months (around 10/07/2022).   Zada Zhong, Medical Student [facility] [place]  

## 2022-04-07 NOTE — Assessment & Plan Note (Addendum)
EKG shows sinus bradycardia in office today. Will refer to cardiology for further workup and accuracy of Apple Watch heart rhythm readings. Check TSH and CBC. Message sent to cardiology to see what they thought about the apple watch reading afib. Patient advised to call us in one week if she has not heard a response from cardiology.

## 2022-04-07 NOTE — Progress Notes (Signed)
Patient seen along with medical student Zada Zhong.  I personally evaluated this patient along with the student, and verified all aspects of the history, physical exam, and medical decision making as documented by the student.  I agree with the student's documentation and have made all necessary edits.  Masahiro Iglesia, MD  

## 2022-04-07 NOTE — Assessment & Plan Note (Deleted)
Stable. Encouraged patient to continue weight-bearing exercises and continue taking her vitamin D supplement. Will continue to offer patient options for osteoporosis medications if she is open to taking it at a follow-up visit.

## 2022-04-07 NOTE — Assessment & Plan Note (Addendum)
Stable. Will get updated LDL and CMP. Continue pravastatin 10 mg daily and zetia 10 mg daily.

## 2022-04-08 LAB — LDL CHOLESTEROL, DIRECT: Direct LDL: 112 mg/dL

## 2022-04-08 LAB — CBC
HCT: 35.3 % — ABNORMAL LOW (ref 36.0–46.0)
Hemoglobin: 12 g/dL (ref 12.0–15.0)
MCHC: 34.1 g/dL (ref 30.0–36.0)
MCV: 94.6 fl (ref 78.0–100.0)
Platelets: 247 10*3/uL (ref 150.0–400.0)
RBC: 3.73 Mil/uL — ABNORMAL LOW (ref 3.87–5.11)
RDW: 13.1 % (ref 11.5–15.5)
WBC: 5.8 10*3/uL (ref 4.0–10.5)

## 2022-04-08 LAB — COMPREHENSIVE METABOLIC PANEL
ALT: 17 U/L (ref 0–35)
AST: 23 U/L (ref 0–37)
Albumin: 4.1 g/dL (ref 3.5–5.2)
Alkaline Phosphatase: 55 U/L (ref 39–117)
BUN: 17 mg/dL (ref 6–23)
CO2: 26 mEq/L (ref 19–32)
Calcium: 9.2 mg/dL (ref 8.4–10.5)
Chloride: 104 mEq/L (ref 96–112)
Creatinine, Ser: 0.74 mg/dL (ref 0.40–1.20)
GFR: 82.17 mL/min (ref >60.00)
Glucose, Bld: 91 mg/dL (ref 70–99)
Potassium: 3.9 mEq/L (ref 3.5–5.1)
Sodium: 138 mEq/L (ref 135–145)
Total Bilirubin: 0.7 mg/dL (ref 0.2–1.2)
Total Protein: 6.7 g/dL (ref 6.0–8.3)

## 2022-04-08 LAB — TSH: TSH: 1.85 u[IU]/mL (ref 0.35–5.50)

## 2022-04-14 ENCOUNTER — Other Ambulatory Visit: Payer: Self-pay

## 2022-04-14 DIAGNOSIS — E785 Hyperlipidemia, unspecified: Secondary | ICD-10-CM

## 2022-04-14 MED ORDER — PRAVASTATIN SODIUM 20 MG PO TABS
20.0000 mg | ORAL_TABLET | Freq: Every day | ORAL | 1 refills | Status: DC
Start: 2022-04-14 — End: 2022-08-27

## 2022-04-14 NOTE — Addendum Note (Signed)
Addended by: Leone Haven on: 04/14/2022 04:33 PM   Modules accepted: Orders

## 2022-05-05 DIAGNOSIS — E7800 Pure hypercholesterolemia, unspecified: Secondary | ICD-10-CM | POA: Diagnosis not present

## 2022-05-05 DIAGNOSIS — R001 Bradycardia, unspecified: Secondary | ICD-10-CM | POA: Diagnosis not present

## 2022-05-05 DIAGNOSIS — I447 Left bundle-branch block, unspecified: Secondary | ICD-10-CM | POA: Diagnosis not present

## 2022-05-05 DIAGNOSIS — R000 Tachycardia, unspecified: Secondary | ICD-10-CM | POA: Diagnosis not present

## 2022-05-05 DIAGNOSIS — R002 Palpitations: Secondary | ICD-10-CM | POA: Diagnosis not present

## 2022-05-12 DIAGNOSIS — R000 Tachycardia, unspecified: Secondary | ICD-10-CM | POA: Insufficient documentation

## 2022-05-26 ENCOUNTER — Other Ambulatory Visit (INDEPENDENT_AMBULATORY_CARE_PROVIDER_SITE_OTHER): Payer: Medicare Other

## 2022-05-26 DIAGNOSIS — E785 Hyperlipidemia, unspecified: Secondary | ICD-10-CM

## 2022-05-26 LAB — HEPATIC FUNCTION PANEL
ALT: 20 U/L (ref 0–35)
AST: 21 U/L (ref 0–37)
Albumin: 4.1 g/dL (ref 3.5–5.2)
Alkaline Phosphatase: 52 U/L (ref 39–117)
Bilirubin, Direct: 0.1 mg/dL (ref 0.0–0.3)
Total Bilirubin: 0.7 mg/dL (ref 0.2–1.2)
Total Protein: 6.6 g/dL (ref 6.0–8.3)

## 2022-05-26 LAB — LDL CHOLESTEROL, DIRECT: Direct LDL: 102 mg/dL

## 2022-05-28 ENCOUNTER — Other Ambulatory Visit: Payer: Medicare Other

## 2022-06-04 ENCOUNTER — Other Ambulatory Visit: Payer: Self-pay

## 2022-06-04 DIAGNOSIS — I10 Essential (primary) hypertension: Secondary | ICD-10-CM

## 2022-06-04 DIAGNOSIS — R002 Palpitations: Secondary | ICD-10-CM | POA: Diagnosis not present

## 2022-06-04 DIAGNOSIS — E785 Hyperlipidemia, unspecified: Secondary | ICD-10-CM

## 2022-06-04 DIAGNOSIS — Z1329 Encounter for screening for other suspected endocrine disorder: Secondary | ICD-10-CM

## 2022-06-10 DIAGNOSIS — R001 Bradycardia, unspecified: Secondary | ICD-10-CM | POA: Diagnosis not present

## 2022-06-10 DIAGNOSIS — E7800 Pure hypercholesterolemia, unspecified: Secondary | ICD-10-CM | POA: Diagnosis not present

## 2022-06-10 DIAGNOSIS — R000 Tachycardia, unspecified: Secondary | ICD-10-CM | POA: Diagnosis not present

## 2022-06-10 DIAGNOSIS — R002 Palpitations: Secondary | ICD-10-CM | POA: Diagnosis not present

## 2022-06-10 DIAGNOSIS — I447 Left bundle-branch block, unspecified: Secondary | ICD-10-CM | POA: Diagnosis not present

## 2022-07-18 ENCOUNTER — Telehealth (INDEPENDENT_AMBULATORY_CARE_PROVIDER_SITE_OTHER): Payer: Medicare Other | Admitting: Nurse Practitioner

## 2022-07-18 ENCOUNTER — Encounter: Payer: Self-pay | Admitting: Nurse Practitioner

## 2022-07-18 ENCOUNTER — Telehealth: Payer: Self-pay | Admitting: Family Medicine

## 2022-07-18 VITALS — Ht 68.0 in | Wt 149.4 lb

## 2022-07-18 DIAGNOSIS — U071 COVID-19: Secondary | ICD-10-CM

## 2022-07-18 DIAGNOSIS — Z8616 Personal history of COVID-19: Secondary | ICD-10-CM | POA: Insufficient documentation

## 2022-07-18 DIAGNOSIS — R6889 Other general symptoms and signs: Secondary | ICD-10-CM

## 2022-07-18 LAB — POCT INFLUENZA A/B
Influenza A, POC: NEGATIVE
Influenza B, POC: NEGATIVE

## 2022-07-18 LAB — POC COVID19 BINAXNOW: SARS Coronavirus 2 Ag: POSITIVE — AB

## 2022-07-18 NOTE — Progress Notes (Signed)
MyChart Video Visit    Virtual Visit via Video Note   This visit type was conducted due to national recommendations for restrictions regarding the COVID-19 Pandemic (e.g. social distancing) in an effort to limit this patient's exposure and mitigate transmission in our community. This patient is at least at moderate risk for complications without adequate follow up. This format is felt to be most appropriate for this patient at this time. Physical exam was limited by quality of the video and audio technology used for the visit. CMA was able to get the patient set up on a video visit.  Patient location: Home. Patient and provider in visit Provider location: Office  I discussed the limitations of evaluation and management by telemedicine and the availability of in person appointments. The patient expressed understanding and agreed to proceed.  Visit Date: 07/18/2022  Today's healthcare provider: Tomasita Morrow, NP     Subjective:    Patient ID: Angelica Newton, female    DOB: [birth year], 72 y.o.   MRN: [id number]  Chief Complaint  Patient presents with   Covid Positive    Had been in contact with someone positive with covid. Took a test this morning some stuffy and has chills, no chest pain, no fever.    HPI Patient reports recent exposure to COVID. She did test at home and it was positive however the test was expired so she is requesting to be tested here. Her symptoms began 3 days ago.  Respiratory illness:  Cough- No  Congestion-    Sinus- Yes, nasal congestion only   Chest- No  Post nasal drip- No  Sore throat- No  Shortness of breath- No  Fever- No  Fatigue/Myalgia- No Headache- No Nausea/Vomiting- No Taste disturbance- No  Smell disturbance- No  Covid exposure- Yes  Covid vaccination- x 5  Flu vaccination- UTD  Medications- Vicks inhaler  Past Medical History:  Diagnosis Date   Arthritis    Right hip   Chicken pox    Depression    History of colonic  polyps    Motion sickness    in past   PONV (postoperative nausea and vomiting)    UTI (urinary tract infection)    Wears hearing aid    bilateral    Past Surgical History:  Procedure Laterality Date   AUGMENTATION MAMMAPLASTY Bilateral 2007   mastopexy/breast lift   BREAST SURGERY  2005   Breast Lift   COLONOSCOPY WITH PROPOFOL N/A 09/17/2015   Procedure: COLONOSCOPY WITH PROPOFOL;  Surgeon: Lucilla Lame, MD;  Location: [place];  Service: Endoscopy;  Laterality: N/A;   COLONOSCOPY WITH PROPOFOL N/A 01/29/2021   Procedure: COLONOSCOPY WITH PROPOFOL;  Surgeon: Lucilla Lame, MD;  Location: [facility] END[facility]COPY;  Service: Endoscopy;  Laterality: N/A;   ORIF ULNAR FRACTURE  1987   Broken Ulna/plates and screws   POLYPECTOMY  09/17/2015   Procedure: POLYPECTOMY INTESTINAL;  Surgeon: Lucilla Lame, MD;  Location: [place];  Service: Endoscopy;;  Sigmoid colon polyp   TOTAL HIP ARTHROPLASTY Right 03/09/2019   Procedure: TOTAL HIP ARTHROPLASTY;  Surgeon: Dereck Leep, MD;  Location: [facility];  Service: Orthopedics;  Laterality: Right;   TUBAL LIGATION      Family History  Problem Relation Age of Onset   Hyperlipidemia Mother    Stroke Mother    Diabetes Mother    Hyperlipidemia Father    Stroke Father    Hypertension Father    Kidney failure Father    Diabetes Father  Diabetes Brother    Cancer Brother        squamous cell carcinoma   Breast cancer Paternal Aunt    Diabetes Maternal Grandmother    Cancer Paternal Grandmother        Breast    Social History   Socioeconomic History   Marital status: Single    Spouse name: Not on file   Number of children: Not on file   Years of education: Not on file   Highest education level: Not on file  Occupational History   Not on file  Tobacco Use   Smoking status: Former    Years: 5.00    Types: Cigarettes    Quit date: 06/09/2010    Years since quitting: 12.1   Smokeless tobacco: Never   Tobacco comments:     only smoked 1 cig per day  Vaping Use   Vaping Use: Never used  Substance and Sexual Activity   Alcohol use: Yes    Alcohol/week: 10.0 standard drinks of alcohol    Types: 10 Glasses of wine per week    Comment: occ   Drug use: No   Sexual activity: Yes    Partners: Male  Other Topics Concern   Not on file  Social History Narrative   Retired, but volunteers    Lives by herself   Children- 2 (53 and 46 yo)    Pets: None    Caffeine: 2 cups in the morning, diet coke rarely    Right handed    Enjoys bike riding, cooking, traveling    Social Determinants of Health   Financial Resource Strain: Low Risk  (01/29/2022)   Overall Financial Resource Strain (CARDIA)    Difficulty of Paying Living Expenses: Not hard at all  Food Insecurity: No Food Insecurity (01/29/2022)   Hunger Vital Sign    Worried About Running Out of Food in the Last Year: Never true    [place] in the Last Year: Never true  Transportation Needs: No Transportation Needs (01/29/2022)   PRAPARE - Hydrologist (Medical): No    Lack of Transportation (Non-Medical): No  Physical Activity: Sufficiently Active (01/29/2022)   Exercise Vital Sign    Days of Exercise per Week: 5 days    Minutes of Exercise per Session: 60 min  Stress: No Stress Concern Present (01/29/2022)   [place]    Feeling of Stress : Not at all  Social Connections: Unknown (01/29/2022)   Social Connection and Isolation Panel [NHANES]    Frequency of Communication with Friends and Family: More than three times a week    Frequency of Social Gatherings with Friends and Family: More than three times a week    Attends Religious Services: Not on file    Active Member of Clubs or Organizations: Yes    Attends Archivist Meetings: More than 4 times per year    Marital Status: Not on file  Intimate Partner Violence: Not At Risk (01/29/2022)    Humiliation, Afraid, Rape, and Kick questionnaire    Fear of Current or Ex-Partner: No    Emotionally Abused: No    Physically Abused: No    Sexually Abused: No    Outpatient Medications Prior to Visit  Medication Sig Dispense Refill   Cholecalciferol (VITAMIN D3) 50 MCG (2000 UT) TABS Take 2,000 Units by mouth every evening.     ezetimibe (ZETIA) 10 MG tablet TAKE  1 TABLET(10 MG) BY MOUTH DAILY 90 tablet 3   pravastatin (PRAVACHOL) 20 MG tablet Take 1 tablet (20 mg total) by mouth daily. 90 tablet 1   No facility-administered medications prior to visit.    Allergies  Allergen Reactions   Lipitor [Atorvastatin]     "feels terrible"   Tetracyclines & Related Other (See Comments)    Just felt lousey   Zocor [Simvastatin] Other (See Comments)    Flu type symptoms   Mango Butter Rash    Just MANGOS not MANGO BUTTER Swollen lips    ROS See HPI     Objective:    Physical Exam  Ht 5' 8"$  (1.727 m)   Wt 149 lb 6.4 oz (67.8 kg) Comment: Pt reported  BMI 22.72 kg/m  Wt Readings from Last 3 Encounters:  07/18/22 149 lb 6.4 oz (67.8 kg)  04/07/22 153 lb 3.2 oz (69.5 kg)  01/29/22 153 lb (69.4 kg)   GENERAL: alert, oriented, appears well and in no acute distress   HEENT: atraumatic, conjunttiva clear, no obvious abnormalities on inspection of external nose and ears   NECK: normal movements of the head and neck   LUNGS: on inspection no signs of respiratory distress, breathing rate appears normal, no obvious gross SOB, gasping or wheezing   CV: no obvious cyanosis   MS: moves all visible extremities without noticeable abnormality   PSYCH/NEURO: pleasant and cooperative, no obvious depression or anxiety, speech and thought processing grossly intact     Assessment & Plan:   Problem List Items Addressed This Visit       Other   COVID-19 - Primary    COVID test positive. Patient with mild symptoms, nasal congestion only. Denies cough. Denies fever. Denies  fatigue/myalgia. Discussed antiviral treatment. Patient agreeable to OTC symptomatic treatment at this time due to mild nature of symptoms. She can use nasal sprays to help with congestion, continue Vicks as needed. Encouraged adequate fluid intake. Counseled on quarantine protocol.       Relevant Orders   POC COVID-19 BinaxNow (Completed)   Other Visit Diagnoses     Flu-like symptoms       Relevant Orders   POCT Influenza A/B (Completed)       I am having Angelica Boston "Vickie" maintain her Vitamin D3, ezetimibe, and pravastatin.  No orders of the defined types were placed in this encounter.   I discussed the assessment and treatment plan with the patient. The patient was provided an opportunity to ask questions and all were answered. The patient agreed with the plan and demonstrated an understanding of the instructions.   The patient was advised to call back or seek an in-person evaluation if the symptoms worsen or if the condition fails to improve as anticipated.   Tomasita Morrow, NP [place] [facility] [phone] (phone) [phone] (fax)  [place]

## 2022-07-18 NOTE — Telephone Encounter (Signed)
FYI  S/w pt - symptoms are very mild, feeling like she just has a slight cold. Interested in antiviral Pt scheduled for virtual at 4pm w/ Ollen Gross today.

## 2022-07-18 NOTE — Telephone Encounter (Signed)
Noted. Forwarding to Ollen Gross so she is aware.

## 2022-07-18 NOTE — Telephone Encounter (Signed)
Pt called in staying that she tested positive for Covid this morning and wanted to let Dr. Raeanne Gathers know. As per pt, her symptoms are very mild, stuffy nose so far. Is theres anything she can do? Or take? She's available @407$ [phone].

## 2022-07-18 NOTE — Assessment & Plan Note (Signed)
COVID test positive. Patient with mild symptoms, nasal congestion only. Denies cough. Denies fever. Denies fatigue/myalgia. Discussed antiviral treatment. Patient agreeable to OTC symptomatic treatment at this time due to mild nature of symptoms. She can use nasal sprays to help with congestion, continue Vicks as needed. Encouraged adequate fluid intake. Counseled on quarantine protocol.

## 2022-08-01 ENCOUNTER — Other Ambulatory Visit: Payer: Self-pay | Admitting: Family Medicine

## 2022-08-19 ENCOUNTER — Other Ambulatory Visit (INDEPENDENT_AMBULATORY_CARE_PROVIDER_SITE_OTHER): Payer: Medicare Other

## 2022-08-19 DIAGNOSIS — E785 Hyperlipidemia, unspecified: Secondary | ICD-10-CM

## 2022-08-19 DIAGNOSIS — I10 Essential (primary) hypertension: Secondary | ICD-10-CM

## 2022-08-19 DIAGNOSIS — Z1329 Encounter for screening for other suspected endocrine disorder: Secondary | ICD-10-CM

## 2022-08-19 LAB — CBC WITH DIFFERENTIAL/PLATELET
Basophils Absolute: 0.1 10*3/uL (ref 0.0–0.1)
Basophils Relative: 2.4 % (ref 0.0–3.0)
Eosinophils Absolute: 0.1 10*3/uL (ref 0.0–0.7)
Eosinophils Relative: 2.2 % (ref 0.0–5.0)
HCT: 33.6 % — ABNORMAL LOW (ref 36.0–46.0)
Hemoglobin: 11.5 g/dL — ABNORMAL LOW (ref 12.0–15.0)
Lymphocytes Relative: 49.4 % — ABNORMAL HIGH (ref 12.0–46.0)
Lymphs Abs: 1.9 10*3/uL (ref 0.7–4.0)
MCHC: 34.2 g/dL (ref 30.0–36.0)
MCV: 90.1 fl (ref 78.0–100.0)
Monocytes Absolute: 0.3 10*3/uL (ref 0.1–1.0)
Monocytes Relative: 6.9 % (ref 3.0–12.0)
Neutro Abs: 1.5 10*3/uL (ref 1.4–7.7)
Neutrophils Relative %: 39.1 % — ABNORMAL LOW (ref 43.0–77.0)
Platelets: 257 10*3/uL (ref 150.0–400.0)
RBC: 3.73 Mil/uL — ABNORMAL LOW (ref 3.87–5.11)
RDW: 14.1 % (ref 11.5–15.5)
WBC: 3.9 10*3/uL — ABNORMAL LOW (ref 4.0–10.5)

## 2022-08-19 LAB — HEPATIC FUNCTION PANEL
ALT: 23 U/L (ref 0–35)
AST: 28 U/L (ref 0–37)
Albumin: 3.9 g/dL (ref 3.5–5.2)
Alkaline Phosphatase: 51 U/L (ref 39–117)
Bilirubin, Direct: 0.1 mg/dL (ref 0.0–0.3)
Total Bilirubin: 0.8 mg/dL (ref 0.2–1.2)
Total Protein: 6.4 g/dL (ref 6.0–8.3)

## 2022-08-19 LAB — COMPREHENSIVE METABOLIC PANEL
ALT: 23 U/L (ref 0–35)
AST: 28 U/L (ref 0–37)
Albumin: 3.9 g/dL (ref 3.5–5.2)
Alkaline Phosphatase: 51 U/L (ref 39–117)
BUN: 15 mg/dL (ref 6–23)
CO2: 25 mEq/L (ref 19–32)
Calcium: 9.4 mg/dL (ref 8.4–10.5)
Chloride: 105 mEq/L (ref 96–112)
Creatinine, Ser: 0.76 mg/dL (ref 0.40–1.20)
GFR: 79.38 mL/min (ref >60.00)
Glucose, Bld: 90 mg/dL (ref 70–99)
Potassium: 4.1 mEq/L (ref 3.5–5.1)
Sodium: 140 mEq/L (ref 135–145)
Total Bilirubin: 0.8 mg/dL (ref 0.2–1.2)
Total Protein: 6.4 g/dL (ref 6.0–8.3)

## 2022-08-19 LAB — LDL CHOLESTEROL, DIRECT: Direct LDL: 101 mg/dL

## 2022-08-19 LAB — TSH: TSH: 3.13 u[IU]/mL (ref 0.35–5.50)

## 2022-08-27 ENCOUNTER — Encounter: Payer: Self-pay | Admitting: Family Medicine

## 2022-08-27 ENCOUNTER — Ambulatory Visit (INDEPENDENT_AMBULATORY_CARE_PROVIDER_SITE_OTHER): Payer: Medicare Other | Admitting: Family Medicine

## 2022-08-27 VITALS — BP 128/78 | HR 57 | Temp 98.2°F | Ht 68.0 in | Wt 150.0 lb

## 2022-08-27 DIAGNOSIS — D649 Anemia, unspecified: Secondary | ICD-10-CM

## 2022-08-27 DIAGNOSIS — E785 Hyperlipidemia, unspecified: Secondary | ICD-10-CM | POA: Diagnosis not present

## 2022-08-27 DIAGNOSIS — Z8616 Personal history of COVID-19: Secondary | ICD-10-CM

## 2022-08-27 DIAGNOSIS — M791 Myalgia, unspecified site: Secondary | ICD-10-CM | POA: Diagnosis not present

## 2022-08-27 MED ORDER — PRAVASTATIN SODIUM 20 MG PO TABS: 20.0000 mg | ORAL_TABLET | Freq: Every day | ORAL | 3 refills | Status: DC

## 2022-08-27 MED ORDER — TETANUS-DIPHTHERIA TOXOIDS TD 5-2 LFU IM INJ: 0.5000 mL | INJECTION | Freq: Once | INTRAMUSCULAR | 0 refills | Status: AC

## 2022-08-27 NOTE — Assessment & Plan Note (Signed)
Resolved at this time.  She will monitor for recurrence and if it does recur she will let us know.

## 2022-08-27 NOTE — Assessment & Plan Note (Signed)
Chronic issue.  Adequately controlled.  She will continue pravastatin 20 mg daily and Zetia 10 mg daily.

## 2022-08-27 NOTE — Assessment & Plan Note (Signed)
Noted on recent labs.  Likely related to blood donation.  Discussed skipping her next planned donation and then resuming blood donations after that.

## 2022-08-27 NOTE — Progress Notes (Signed)
Tommi Rumps, MD Phone: [phone]  Angelica Newton is a 72 y.o. female who presents today for f/u.  HYPERLIPIDEMIA Symptoms Chest pain on exertion:  no   Leg claudication:   no Medications: Compliance- taking pravastatin, zetia Right upper quadrant pain- no  Muscle aches- no Lipid Panel     Component Value Date/Time   CHOL 214 (H) 08/16/2021 0857   TRIG 83.0 08/16/2021 0857   HDL 69.00 08/16/2021 0857   CHOLHDL 3 08/16/2021 0857   VLDL 16.6 08/16/2021 0857   LDLCALC 128 (H) 08/16/2021 0857   LDLDIRECT 101.0 08/19/2022 0805   Anemia: Noted on recent lab work.  She notes she had just given blood 2 weeks prior to having labs done.  She notes her hemoglobin was at least 13 at the time of blood donation.  Muscular pain: Patient notes a single spot on her right lateral calf that bothered her for about a week and then felt like there was some numbness in the area though it has resolved at this time.  COVID-19: Patient notes no symptoms when she had COVID.  She notes since then she has felt cold.  The 10-year ASCVD risk score (Arnett DK, et al., 2019) is: 9.3%   Values used to calculate the score:     Age: 77 years     Sex: Female     Is Non-Hispanic African American: No     Diabetic: No     Tobacco smoker: No     Systolic Blood Pressure: 0000000 mmHg     Is BP treated: No     HDL Cholesterol: 69 mg/dL     Total Cholesterol: 214 mg/dL   Social History   Tobacco Use  Smoking Status Former   Years: 5   Types: Cigarettes   Quit date: 06/09/2010   Years since quitting: 12.2  Smokeless Tobacco Never  Tobacco Comments   only smoked 1 cig per day    Current Outpatient Medications on File Prior to Visit  Medication Sig Dispense Refill   Cholecalciferol (VITAMIN D3) 50 MCG (2000 UT) TABS Take 2,000 Units by mouth every evening.     Collagen Hydrolysate, Bovine, POWD by Does not apply route.     ezetimibe (ZETIA) 10 MG tablet TAKE 1 TABLET(10 MG) BY MOUTH DAILY 90  tablet 3   No current facility-administered medications on file prior to visit.     ROS see history of present illness  Objective  Physical Exam Vitals:   08/27/22 0904 08/27/22 0918  BP: 136/78 128/78  Pulse: (!) 57   Temp: 98.2 F (36.8 C)   SpO2: 99%     BP Readings from Last 3 Encounters:  08/27/22 128/78  04/07/22 120/70  11/26/21 130/80   Wt Readings from Last 3 Encounters:  08/27/22 150 lb (68 kg)  07/18/22 149 lb 6.4 oz (67.8 kg)  04/07/22 153 lb 3.2 oz (69.5 kg)    Physical Exam Constitutional:      General: She is not in acute distress.    Appearance: She is not diaphoretic.  Cardiovascular:     Rate and Rhythm: Normal rate and regular rhythm.     Heart sounds: Normal heart sounds.  Pulmonary:     Effort: Pulmonary effort is normal.     Breath sounds: Normal breath sounds.  Skin:    General: Skin is warm and dry.  Neurological:     Mental Status: She is alert.      Assessment/Plan: Please see individual problem  list.  Hyperlipidemia, unspecified hyperlipidemia type Assessment & Plan: Chronic issue.  Adequately controlled.  She will continue pravastatin 20 mg daily and Zetia 10 mg daily.  Orders: -     Pravastatin Sodium; Take 1 tablet (20 mg total) by mouth daily.  Dispense: 90 tablet; Refill: 3  Anemia, unspecified type Assessment & Plan: Noted on recent labs.  Likely related to blood donation.  Discussed skipping her next planned donation and then resuming blood donations after that.   History of COVID-19 Assessment & Plan: Discussed the cold sensation could improve with time.  She will monitor.  Encouraged her to consider the [place] vaccination in the fall.   Muscle pain Assessment & Plan: Resolved at this time.  She will monitor for recurrence and if it does recur she will let us know.   Other orders -     Tetanus-Diphtheria Toxoids Td; Inject 0.5 mLs into the muscle once for 1 dose.  Dispense: 0.5 mL; Refill: 0     Health  Maintenance: Patient will get her tetanus vaccine at the pharmacy.  Return in about 1 year (around 08/27/2023) for Yearly checkup.   Tommi Rumps, MD [place]

## 2022-08-27 NOTE — Assessment & Plan Note (Addendum)
Discussed the cold sensation could improve with time.  She will monitor.  Encouraged her to consider the [place] vaccination in the fall.

## 2022-08-27 NOTE — Patient Instructions (Signed)
Nice to see you. Please let me know if the muscle pain in your right calf returns. Please skip your next blood donation to give your red blood cells time to recover.

## 2022-11-26 ENCOUNTER — Ambulatory Visit (INDEPENDENT_AMBULATORY_CARE_PROVIDER_SITE_OTHER): Payer: Medicare Other

## 2022-11-26 ENCOUNTER — Ambulatory Visit
Admission: EM | Admit: 2022-11-26 | Discharge: 2022-11-26 | Disposition: A | Discharge status: Home / Self Care | Payer: Medicare Other | Attending: Emergency Medicine | Admitting: Emergency Medicine

## 2022-11-26 ENCOUNTER — Other Ambulatory Visit: Payer: Self-pay

## 2022-11-26 ENCOUNTER — Encounter: Payer: Self-pay | Admitting: Emergency Medicine

## 2022-11-26 DIAGNOSIS — M25531 Pain in right wrist: Secondary | ICD-10-CM | POA: Diagnosis not present

## 2022-11-26 DIAGNOSIS — Z043 Encounter for examination and observation following other accident: Secondary | ICD-10-CM | POA: Diagnosis not present

## 2022-11-26 DIAGNOSIS — S60211A Contusion of right wrist, initial encounter: Secondary | ICD-10-CM

## 2022-11-26 NOTE — ED Provider Notes (Signed)
Renaldo Fiddler    CSN: [id number] Arrival date & time: 11/26/22  1006      History   Chief Complaint Chief Complaint  Patient presents with   Wrist Pain    HPI Ivalee Amabile is a 72 y.o. female.  Patient presents with pain, swelling, bruising of her right wrist after she accidentally fell last night when she stepped on a ball.  Treatment with ice packs and Tylenol.  No open wounds, numbness, weakness, or other symptoms.  Her medical history includes arthritis.  The history is provided by the patient and medical records.    Past Medical History:  Diagnosis Date   Arthritis    Right hip   Chicken pox    Depression    History of colonic polyps    Motion sickness    in past   PONV (postoperative nausea and vomiting)    UTI (urinary tract infection)    Wears hearing aid    bilateral    Patient Active Problem List   Diagnosis Date Noted   Muscle pain 08/27/2022   History of COVID-19 07/18/2022   Palpitations 04/07/2022   Anemia 08/21/2021   Cervical radiculopathy 08/21/2020   Left bundle branch block (LBBB) 03/02/2019   Osteoporosis 07/20/2017   Hyperlipidemia 05/10/2015    Past Surgical History:  Procedure Laterality Date   AUGMENTATION MAMMAPLASTY Bilateral 2007   mastopexy/breast lift   BREAST SURGERY  2005   Breast Lift   COLONOSCOPY WITH PROPOFOL N/A 09/17/2015   Procedure: COLONOSCOPY WITH PROPOFOL;  Surgeon: Midge Minium, MD;  Location: [facility];  Service: Endoscopy;  Laterality: N/A;   COLONOSCOPY WITH PROPOFOL N/A 01/29/2021   Procedure: COLONOSCOPY WITH PROPOFOL;  Surgeon: Midge Minium, MD;  Location: [facility] ENDOSCOPY;  Service: Endoscopy;  Laterality: N/A;   ORIF ULNAR FRACTURE  1987   Broken Ulna/plates and screws   POLYPECTOMY  09/17/2015   Procedure: POLYPECTOMY INTESTINAL;  Surgeon: Midge Minium, MD;  Location: [facility];  Service: Endoscopy;;  Sigmoid colon polyp   TOTAL HIP ARTHROPLASTY Right 03/09/2019    Procedure: TOTAL HIP ARTHROPLASTY;  Surgeon: Donato Heinz, MD;  Location: [facility];  Service: Orthopedics;  Laterality: Right;   TUBAL LIGATION      OB History   No obstetric history on file.      Home Medications    Prior to Admission medications   Medication Sig Start Date End Date Taking? Authorizing Provider  Cholecalciferol (VITAMIN D3) 50 MCG (2000 UT) TABS Take 2,000 Units by mouth every evening.    [provider]  Collagen Hydrolysate, Bovine, POWD by Does not apply route.    [provider]  ezetimibe (ZETIA) 10 MG tablet TAKE 1 TABLET(10 MG) BY MOUTH DAILY 08/01/22   Glori Luis, MD  pravastatin (PRAVACHOL) 20 MG tablet Take 1 tablet (20 mg total) by mouth daily. 08/27/22   Glori Luis, MD    Family History Family History  Problem Relation Age of Onset   Hyperlipidemia Mother    Stroke Mother    Diabetes Mother    Hyperlipidemia Father    Stroke Father    Hypertension Father    Kidney failure Father    Diabetes Father    Diabetes Brother    Cancer Brother        squamous cell carcinoma   Breast cancer Paternal Aunt    Diabetes Maternal Grandmother    Cancer Paternal Grandmother        Breast  Social History Social History   Tobacco Use   Smoking status: Former    Years: 5    Types: Cigarettes    Quit date: 06/09/2010    Years since quitting: 12.4   Smokeless tobacco: Never   Tobacco comments:    only smoked 1 cig per day  Vaping Use   Vaping Use: Never used  Substance Use Topics   Alcohol use: Yes    Alcohol/week: 10.0 standard drinks of alcohol    Types: 10 Glasses of wine per week    Comment: occ   Drug use: No     Allergies   Lipitor [atorvastatin], Tetracyclines & related, Zocor [simvastatin], and Mango butter   Review of Systems Review of Systems  Constitutional:  Negative for chills and fever.  Musculoskeletal:  Positive for arthralgias and joint swelling.  Skin:  Positive for color change.  Negative for wound.  Neurological:  Negative for weakness and numbness.     Physical Exam Triage Vital Signs ED Triage Vitals  Enc Vitals Group     BP 11/26/22 1027 (!) 153/78     Pulse Rate 11/26/22 1021 (!) 58     Resp 11/26/22 1021 18     Temp 11/26/22 1021 97.8 F (36.6 C)     Temp src --      SpO2 11/26/22 1021 98 %     Weight --      Height --      Head Circumference --      Peak Flow --      Pain Score 11/26/22 1024 7     Pain Loc --      Pain Edu? --      Excl. in GC? --    No data found.  Updated Vital Signs BP (!) 153/78 (BP Location: Left Arm)   Pulse (!) 58   Temp 97.8 F (36.6 C)   Resp 18   SpO2 98%   Visual Acuity Right Eye Distance:   Left Eye Distance:   Bilateral Distance:    Right Eye Near:   Left Eye Near:    Bilateral Near:     Physical Exam Constitutional:      General: She is not in acute distress.    Appearance: Normal appearance.  HENT:     Mouth/Throat:     Mouth: Mucous membranes are moist.  Cardiovascular:     Rate and Rhythm: Normal rate and regular rhythm.     Heart sounds: Normal heart sounds.  Pulmonary:     Effort: Pulmonary effort is normal. No respiratory distress.     Breath sounds: Normal breath sounds.  Musculoskeletal:        General: Swelling and tenderness present. No deformity.     Comments: Right wrist: tender, mild edema, ecchymosis.  2+ radial pulse, sensation intact, strength 5/5.  ROM limited by discomfort.   Skin:    Capillary Refill: Capillary refill takes less than 2 seconds.     Findings: Bruising present. No lesion.  Neurological:     General: No focal deficit present.     Mental Status: She is alert and oriented to person, place, and time.     Sensory: No sensory deficit.     Motor: No weakness.  Psychiatric:        Mood and Affect: Mood normal.        Behavior: Behavior normal.      UC Treatments / Results  Labs (all labs ordered are listed, but only  abnormal results are displayed) Labs  Reviewed - No data to display  EKG   Radiology DG Wrist Complete Right  Result Date: 11/26/2022 CLINICAL DATA:  Fall EXAM: RIGHT WRIST - COMPLETE 3+ VIEW COMPARISON:  None Available. FINDINGS: There is no acute fracture or dislocation. Bony alignment is normal. There is mild degenerative change at the thumb and index finger CMC joints. The joint spaces are otherwise preserved. There is no erosive change. The soft tissues are unremarkable. The soft tissues are unremarkable. IMPRESSION: No evidence of acute injury in the wrist. Electronically Signed   By: Lesia Hausen M.D.   On: 11/26/2022 11:14    Procedures Procedures (including critical care time)  Medications Ordered in UC Medications - No data to display  Initial Impression / Assessment and Plan / UC Course  I have reviewed the triage vital signs and the nursing notes.  Pertinent labs & imaging results that were available during my care of the patient were reviewed by me and considered in my medical decision making (see chart for details).    Right wrist pain, right wrist contusion.  X-ray negative for acute bony abnormality.  Treating with wrist splint, rest, elevation, ice packs, ibuprofen.  Education provided on wrist pain.  Instructed patient to follow-up with orthopedics if her symptoms are not improving.  Contact information for on-call Ortho provided.  Patient agrees to plan of care.   Final Clinical Impressions(s) / UC Diagnoses   Final diagnoses:  Right wrist pain  Contusion of right wrist, initial encounter     Discharge Instructions      Take ibuprofen as directed.  Rest and elevate your wrist.  Apply ice packs 2-3 times a day for up to 20 minutes each.  Wear the wrist splint as needed for comfort.    Follow up with an orthopedist if your symptoms are not improving.        ED Prescriptions   None    PDMP not reviewed this encounter.   Mickie Bail, NP 11/26/22 1128

## 2022-11-26 NOTE — Discharge Instructions (Addendum)
Take ibuprofen as directed.  Rest and elevate your wrist.  Apply ice packs 2-3 times a day for up to 20 minutes each.  Wear the wrist splint as needed for comfort.    Follow up with an orthopedist if your symptoms are not improving.

## 2022-11-26 NOTE — ED Triage Notes (Signed)
Stepped on a ball last night and fell.  Patient has taken aspirin and put ice on wrist.  Then took tylenol and used ice, this helped the most.  Patient can move fingers.  Right radial pulse 2 +.  Cannot rotate right forearm-this is very painful

## 2022-12-01 DIAGNOSIS — D225 Melanocytic nevi of trunk: Secondary | ICD-10-CM | POA: Diagnosis not present

## 2022-12-01 DIAGNOSIS — D2262 Melanocytic nevi of left upper limb, including shoulder: Secondary | ICD-10-CM | POA: Diagnosis not present

## 2022-12-01 DIAGNOSIS — D2271 Melanocytic nevi of right lower limb, including hip: Secondary | ICD-10-CM | POA: Diagnosis not present

## 2022-12-01 DIAGNOSIS — D2272 Melanocytic nevi of left lower limb, including hip: Secondary | ICD-10-CM | POA: Diagnosis not present

## 2022-12-01 DIAGNOSIS — L821 Other seborrheic keratosis: Secondary | ICD-10-CM | POA: Diagnosis not present

## 2022-12-01 DIAGNOSIS — D2261 Melanocytic nevi of right upper limb, including shoulder: Secondary | ICD-10-CM | POA: Diagnosis not present

## 2022-12-02 ENCOUNTER — Other Ambulatory Visit: Payer: Self-pay | Admitting: Family Medicine

## 2022-12-02 DIAGNOSIS — Z1231 Encounter for screening mammogram for malignant neoplasm of breast: Secondary | ICD-10-CM

## 2022-12-12 ENCOUNTER — Ambulatory Visit
Admission: RE | Admit: 2022-12-12 | Discharge: 2022-12-12 | Disposition: A | Discharge status: Home / Self Care | Payer: Medicare Other | Source: Ambulatory Visit | Attending: Family Medicine | Admitting: Family Medicine

## 2022-12-12 DIAGNOSIS — Z1231 Encounter for screening mammogram for malignant neoplasm of breast: Secondary | ICD-10-CM | POA: Diagnosis present

## 2022-12-16 DIAGNOSIS — I447 Left bundle-branch block, unspecified: Secondary | ICD-10-CM | POA: Diagnosis not present

## 2022-12-16 DIAGNOSIS — R002 Palpitations: Secondary | ICD-10-CM | POA: Diagnosis not present

## 2022-12-16 DIAGNOSIS — E7800 Pure hypercholesterolemia, unspecified: Secondary | ICD-10-CM | POA: Diagnosis not present

## 2023-01-30 ENCOUNTER — Ambulatory Visit (INDEPENDENT_AMBULATORY_CARE_PROVIDER_SITE_OTHER): Payer: Medicare Other | Admitting: *Deleted

## 2023-01-30 VITALS — Ht 68.0 in | Wt 147.0 lb

## 2023-01-30 DIAGNOSIS — Z0000 Encounter for general adult medical examination without abnormal findings: Secondary | ICD-10-CM | POA: Diagnosis not present

## 2023-01-30 NOTE — Progress Notes (Signed)
Subjective:   Angelica Newton is a 72 y.o. female who presents for Medicare Annual (Subsequent) preventive examination.  Visit Complete: Virtual  I connected with  Angelica Newton on 01/30/23 by a audio enabled telemedicine application and verified that I am speaking with the correct person using two identifiers.  Patient Location: Home  Provider Location: Home Office  I discussed the limitations of evaluation and management by telemedicine. The patient expressed understanding and agreed to proceed.  Patient Medicare AWV questionnaire was completed by the patient on 01/28/23; I have confirmed that all information answered by patient is correct and no changes since this date.  Vital Signs: Unable to obtain new vitals due to this being a telehealth visit.  Review of Systems     Cardiac Risk Factors include: advanced age (>69men, >73 women);dyslipidemia;Other (see comment), Risk factor comments: LBBB     Objective:    Today's Vitals   01/30/23 1027  Weight: 147 lb (66.7 kg)  Height: 5\' 8"  (1.727 m)   Body mass index is 22.35 kg/m.     01/30/2023   10:38 AM 01/29/2022    1:19 PM 01/29/2021    8:18 AM 01/21/2021    3:42 PM 09/04/2020    3:35 PM 12/26/2019    9:46 AM 03/09/2019   12:03 PM  Advanced Directives  Does Patient Have a Medical Advance Directive? Yes Yes No Yes Yes Yes Yes  Type of Estate agent of [place];Living will Healthcare Power of [place];Living will  Healthcare Power of [place];Living will  Healthcare Power of [place];Living will Living will;Healthcare Power of Attorney  Does patient want to make changes to medical advance directive? No - Patient declined No - Patient declined  No - Patient declined No - Patient declined No - Patient declined No - Patient declined  Copy of Healthcare Power of Attorney in Chart? Yes - validated most recent copy scanned in chart (See row information) Yes - validated most recent copy scanned in  chart (See row information)  Yes - validated most recent copy scanned in chart (See row information)  Yes - validated most recent copy scanned in chart (See row information)   Would patient like information on creating a medical advance directive?   No - Patient declined        Current Medications (verified) Outpatient Encounter Medications as of 01/30/2023  Medication Sig   Cholecalciferol (VITAMIN D3) 50 MCG (2000 UT) TABS Take 2,000 Units by mouth every evening.   Collagen Hydrolysate, Bovine, POWD by Does not apply route.   ezetimibe (ZETIA) 10 MG tablet TAKE 1 TABLET(10 MG) BY MOUTH DAILY   Misc Natural Products (LUTEIN 20 PO) Take by mouth. Take one by mouth daily   pravastatin (PRAVACHOL) 20 MG tablet Take 1 tablet (20 mg total) by mouth daily.   No facility-administered encounter medications on file as of 01/30/2023.    Allergies (verified) Lipitor [atorvastatin], Tetracyclines & related, Zocor [simvastatin], and Mango butter   History: Past Medical History:  Diagnosis Date   Arthritis    Right hip   Chicken pox    Depression    History of colonic polyps    Motion sickness    in past   PONV (postoperative nausea and vomiting)    UTI (urinary tract infection)    Wears hearing aid    bilateral   Past Surgical History:  Procedure Laterality Date   AUGMENTATION MAMMAPLASTY Bilateral 2007   mastopexy/breast lift   BREAST SURGERY  2005  Breast Lift   COLONOSCOPY WITH PROPOFOL N/A 09/17/2015   Procedure: COLONOSCOPY WITH PROPOFOL;  Surgeon: Midge Minium, MD;  Location: [facility];  Service: Endoscopy;  Laterality: N/A;   COLONOSCOPY WITH PROPOFOL N/A 01/29/2021   Procedure: COLONOSCOPY WITH PROPOFOL;  Surgeon: Midge Minium, MD;  Location: [facility] END[facility];  Service: Endoscopy;  Laterality: N/A;   ORIF ULNAR FRACTURE  1987   Broken Ulna/plates and screws   POLYPECTOMY  09/17/2015   Procedure: POLYPECTOMY INTESTINAL;  Surgeon: Midge Minium, MD;  Location: [facility];  Service: Endoscopy;;  Sigmoid colon polyp   TOTAL HIP ARTHROPLASTY Right 03/09/2019   Procedure: TOTAL HIP ARTHROPLASTY;  Surgeon: Donato Heinz, MD;  Location: [facility] ORS;  Service: Orthopedics;  Laterality: Right;   TUBAL LIGATION     Family History  Problem Relation Age of Onset   Hyperlipidemia Mother    Stroke Mother    Diabetes Mother    Hyperlipidemia Father    Stroke Father    Hypertension Father    Kidney failure Father    Diabetes Father    Diabetes Maternal Grandmother    Breast cancer Paternal Grandmother    Cancer Paternal Grandmother        Breast   Diabetes Brother    Cancer Brother        squamous cell carcinoma   Social History   Socioeconomic History   Marital status: Single    Spouse name: Not on file   Number of children: Not on file   Years of education: Not on file   Highest education level: Not on file  Occupational History   Not on file  Tobacco Use   Smoking status: Former    Current packs/day: 0.00    Types: Cigarettes    Start date: 06/09/2005    Quit date: 06/09/2010    Years since quitting: 12.6   Smokeless tobacco: Never   Tobacco comments:    only smoked 1 cig per day  Vaping Use   Vaping status: Never Used  Substance and Sexual Activity   Alcohol use: Yes    Alcohol/week: 10.0 standard drinks of alcohol    Types: 10 Glasses of wine per week    Comment: occ   Drug use: No   Sexual activity: Yes    Partners: Male  Other Topics Concern   Not on file  Social History Narrative   Retired, but volunteers    Lives by herself   Children- 2 (42 and 1 yo)    Pets: None    Caffeine: 2 cups in the morning, diet coke rarely    Right handed    Enjoys bike riding, cooking, traveling    Social Determinants of Health   Financial Resource Strain: Low Risk  (01/28/2023)   Overall Financial Resource Strain (CARDIA)    Difficulty of Paying Living Expenses: Not hard at all  Food Insecurity: No Food Insecurity (01/28/2023)   Hunger Vital  Sign    Worried About Running Out of Food in the Last Year: Never true    Ran Out of Food in the Last Year: Never true  Transportation Needs: No Transportation Needs (01/28/2023)   PRAPARE - Administrator, Civil Service (Medical): No    Lack of Transportation (Non-Medical): No  Physical Activity: Sufficiently Active (01/28/2023)   Exercise Vital Sign    Days of Exercise per Week: 4 days    Minutes of Exercise per Session: 60 min  Stress: No Stress Concern  Present (01/28/2023)   [employer] of Occupational Health - Occupational Stress Questionnaire    Feeling of Stress : Not at all  Social Connections: Unknown (01/28/2023)   Social Connection and Isolation Panel [NHANES]    Frequency of Communication with Friends and Family: Three times a week    Frequency of Social Gatherings with Friends and Family: Three times a week    Attends Religious Services: Not on file    Active Member of Clubs or Organizations: Yes    Attends Banker Meetings: More than 4 times per year    Marital Status: Divorced    Tobacco Counseling Counseling given: Not Answered Tobacco comments: only smoked 1 cig per day   Clinical Intake:  Pre-visit preparation completed: Yes  Pain : No/denies pain     BMI - recorded: 22.35 Nutritional Status: BMI of 19-24  Normal Nutritional Risks: None Diabetes: No  How often do you need to have someone help you when you read instructions, pamphlets, or other written materials from your doctor or pharmacy?: 1 - Never  Interpreter Needed?: No  Information entered by :: R. Marina Desire LPN   Activities of Daily Living    01/30/2023   10:28 AM 01/28/2023   12:07 PM  In your present state of health, do you have any difficulty performing the following activities:  Hearing? 1 1  Comment wears aids   Vision? 0 0  Comment glasses   Difficulty concentrating or making decisions? 0 0  Walking or climbing stairs? 0 0  Dressing or bathing? 0 0  Doing  errands, shopping? 0 0  Preparing Food and eating ? N N  Using the Toilet? N N  In the past six months, have you accidently leaked urine? N N  Do you have problems with loss of bowel control? N N  Managing your Medications? N N  Managing your Finances? N N  Housekeeping or managing your Housekeeping? N N    Patient Care Team: Glori Luis, MD as PCP - General (Family Medicine)  Indicate any recent Medical Services you may have received from other than [facility] providers in the past year (date may be approximate).     Assessment:   This is a routine wellness examination for [place].  Hearing/Vision screen Hearing Screening - Comments:: Wears aids Vision Screening - Comments:: glasses  Dietary issues and exercise activities discussed:     Goals Addressed             This Visit's Progress    Patient Stated       Wants to lose a few pounds       Depression Screen    01/30/2023   10:34 AM 08/27/2022    9:07 AM 07/18/2022   10:06 AM 04/07/2022    2:50 PM 01/29/2022    1:24 PM 11/26/2021    8:04 AM 08/21/2021    8:52 AM  PHQ 2/9 Scores  PHQ - 2 Score 0 0 1 0 0 0 0  PHQ- 9 Score 0 0         Fall Risk    01/28/2023   12:07 PM 08/27/2022    9:07 AM 07/18/2022   10:06 AM 04/07/2022    2:50 PM 01/29/2022    1:27 PM  Fall Risk   Falls in the past year? 1 0 0 0 0  Number falls in past yr: 0 0 0 0 0  Injury with Fall? 1 0 0 0   Risk for fall  due to : History of fall(s);Impaired balance/gait No Fall Risks No Fall Risks No Fall Risks   Follow up Falls evaluation completed;Falls prevention discussed Falls evaluation completed Falls evaluation completed Falls evaluation completed Falls evaluation completed    MEDICARE RISK AT HOME: Medicare Risk at Home Any stairs in or around the home?: No If so, are there any without handrails?: No Home free of loose throw rugs in walkways, pet beds, electrical cords, etc?: Yes Adequate lighting in your home to reduce risk of falls?:  Yes Life alert?: No Use of a cane, walker or w/c?: No Grab bars in the bathroom?: No Shower chair or bench in shower?: No Elevated toilet seat or a handicapped toilet?: Yes  Cognitive Function:        01/30/2023   10:38 AM 01/29/2022    1:26 PM 12/26/2019    9:51 AM 12/24/2018    9:55 AM  6CIT Screen  What Year? 0 points  0 points 0 points  What month? 0 points  0 points 0 points  What time? 0 points  0 points 0 points  Count back from 20 0 points   0 points  Months in reverse 0 points 0 points  0 points  Repeat phrase 0 points   0 points  Total Score 0 points   0 points    Immunizations Immunization History  Administered Date(s) Administered   Fluad Quad(high Dose 65+) 04/07/2022   Influenza Split 07/03/2014, 04/17/2020   Influenza, High Dose Seasonal PF 05/10/2018   Influenza,inj,Quad PF,6+ Mos 05/10/2015   PFIZER Comirnaty(Gray Top)Covid-19 Tri-Sucrose Vaccine 07/27/2019, 08/17/2019   PFIZER(Purple Top)SARS-COV-2 Vaccination 07/27/2019, 08/17/2019, 04/17/2020   Pneumococcal Conjugate-13 05/08/2017   Pneumococcal Polysaccharide-23 11/14/2020   Zoster Recombinant(Shingrix) 02/25/2021, 07/15/2021    TDAP status: Due, Education has been provided regarding the importance of this vaccine. Advised may receive this vaccine at local pharmacy or Health Dept. Aware to provide a copy of the vaccination record if obtained from local pharmacy or Health Dept. Verbalized acceptance and understanding.  Flu Vaccine status: Up to date  Pneumococcal vaccine status: Up to date  Covid-19 vaccine status: Completed vaccines  Qualifies for Shingles Vaccine? Yes   Zostavax completed No   Shingrix Completed?: Yes  Screening Tests Health Maintenance  Topic Date Due   DTaP/Tdap/Td (1 - Tdap) Never done   COVID-19 Vaccine (6 - 2023-24 season) 02/07/2022   Medicare Annual Wellness (AWV)  01/30/2023   INFLUENZA VACCINE  01/08/2023   MAMMOGRAM  12/11/2024   Colonoscopy  01/30/2028    Pneumonia Vaccine 52+ Years old  Completed   DEXA SCAN  Completed   Hepatitis C Screening  Completed   Zoster Vaccines- Shingrix  Completed   HPV VACCINES  Aged Out    Health Maintenance  Health Maintenance Due  Topic Date Due   DTaP/Tdap/Td (1 - Tdap) Never done   COVID-19 Vaccine (6 - 2023-24 season) 02/07/2022   Medicare Annual Wellness (AWV)  01/30/2023   INFLUENZA VACCINE  01/08/2023    Colorectal cancer screening: Type of screening: Colonoscopy. Completed 8/22. Repeat every 7 years  Mammogram status: Completed 7/24. Repeat every year  Bone Density status: Completed 5/23. Results reflect: Bone density results: OSTEOPOROSIS. Repeat every 2 years.  Lung Cancer Screening: (Low Dose CT Chest recommended if Age 32-80 years, 20 pack-year currently smoking OR have quit w/in 15years.) does not qualify.    Additional Screening:  Hepatitis C Screening: does qualify; Completed 2/16  Vision Screening: Recommended annual ophthalmology exams for early  detection of glaucoma and other disorders of the eye. Is the patient up to date with their annual eye exam?  No  Who is the provider or what is the name of the office in which the patient attends annual eye exams? [facility]  [place] If pt is not established with a provider, would they like to be referred to a provider to establish care? No .   Dental Screening: Recommended annual dental exams for proper oral hygiene   Community Resource Referral / Chronic Care Management: CRR required this visit?  No   CCM required this visit?  No     Plan:     I have personally reviewed and noted the following in the patient's chart:   Medical and social history Use of alcohol, tobacco or illicit drugs  Current medications and supplements including opioid prescriptions. Patient is not currently taking opioid prescriptions. Functional ability and status Nutritional status Physical activity Advanced directives List of other  physicians Hospitalizations, surgeries, and ER visits in previous 12 months Vitals Screenings to include cognitive, depression, and falls Referrals and appointments  In addition, I have reviewed and discussed with patient certain preventive protocols, quality metrics, and best practice recommendations. A written personalized care plan for preventive services as well as general preventive health recommendations were provided to patient.     Sydell Axon, LPN   11/04/4130   After Visit Summary: (MyChart) Due to this being a telephonic visit, the after visit summary with patients personalized plan was offered to patient via MyChart   Nurse Notes: None

## 2023-01-30 NOTE — Patient Instructions (Signed)
Angelica Newton , Thank you for taking time to come for your Medicare Wellness Visit. I appreciate your ongoing commitment to your health goals. Please review the following plan we discussed and let me know if I can assist you in the future.   Referrals/Orders/Follow-Ups/Clinician Recommendations: None  This is a list of the screening recommended for you and due dates:  Health Maintenance  Topic Date Due   DTaP/Tdap/Td vaccine (1 - Tdap) Never done   COVID-19 Vaccine (6 - 2023-24 season) 02/07/2022   Flu Shot  01/08/2023   Medicare Annual Wellness Visit  01/30/2024   Mammogram  12/11/2024   Colon Cancer Screening  01/30/2028   Pneumonia Vaccine  Completed   DEXA scan (bone density measurement)  Completed   Hepatitis C Screening  Completed   Zoster (Shingles) Vaccine  Completed   HPV Vaccine  Aged Out    Advanced directives: (In Chart) A copy of your advanced directives are scanned into your chart should your provider ever need it.  Next Medicare Annual Wellness Visit scheduled for next year: Yes 02/03/24 @ 10:30

## 2023-02-03 DIAGNOSIS — H353131 Nonexudative age-related macular degeneration, bilateral, early dry stage: Secondary | ICD-10-CM | POA: Diagnosis not present

## 2023-02-03 DIAGNOSIS — H2513 Age-related nuclear cataract, bilateral: Secondary | ICD-10-CM | POA: Diagnosis not present

## 2023-05-04 DIAGNOSIS — M1612 Unilateral primary osteoarthritis, left hip: Secondary | ICD-10-CM | POA: Diagnosis not present

## 2023-05-04 DIAGNOSIS — G8929 Other chronic pain: Secondary | ICD-10-CM | POA: Diagnosis not present

## 2023-05-04 DIAGNOSIS — M25552 Pain in left hip: Secondary | ICD-10-CM | POA: Diagnosis not present

## 2023-06-30 DIAGNOSIS — M1612 Unilateral primary osteoarthritis, left hip: Secondary | ICD-10-CM | POA: Diagnosis not present

## 2023-07-02 ENCOUNTER — Other Ambulatory Visit: Payer: Self-pay

## 2023-07-02 ENCOUNTER — Encounter
Admission: RE | Admit: 2023-07-02 | Discharge: 2023-07-02 | Disposition: A | Discharge status: Home / Self Care | Payer: Medicare Other | Source: Ambulatory Visit | Attending: Orthopedic Surgery | Admitting: Orthopedic Surgery

## 2023-07-02 DIAGNOSIS — D649 Anemia, unspecified: Secondary | ICD-10-CM | POA: Insufficient documentation

## 2023-07-02 DIAGNOSIS — Z01818 Encounter for other preprocedural examination: Secondary | ICD-10-CM | POA: Diagnosis not present

## 2023-07-02 DIAGNOSIS — M1612 Unilateral primary osteoarthritis, left hip: Secondary | ICD-10-CM | POA: Diagnosis not present

## 2023-07-02 DIAGNOSIS — Z01812 Encounter for preprocedural laboratory examination: Secondary | ICD-10-CM

## 2023-07-02 DIAGNOSIS — M791 Myalgia, unspecified site: Secondary | ICD-10-CM | POA: Insufficient documentation

## 2023-07-02 DIAGNOSIS — Z01810 Encounter for preprocedural cardiovascular examination: Secondary | ICD-10-CM | POA: Diagnosis not present

## 2023-07-02 HISTORY — DX: Hyperlipidemia, unspecified: E78.5

## 2023-07-02 HISTORY — DX: Pure hypercholesterolemia, unspecified: E78.00

## 2023-07-02 HISTORY — DX: Palpitations: R00.2

## 2023-07-02 HISTORY — DX: Anemia, unspecified: D64.9

## 2023-07-02 HISTORY — DX: Left bundle-branch block, unspecified: I44.7

## 2023-07-02 HISTORY — DX: Unilateral primary osteoarthritis, left hip: M16.12

## 2023-07-02 HISTORY — DX: Radiculopathy, cervical region: M54.12

## 2023-07-02 LAB — CBC WITH DIFFERENTIAL/PLATELET
Abs Immature Granulocytes: 0.01 10*3/uL (ref 0.00–0.07)
Basophils Absolute: 0.1 10*3/uL (ref 0.0–0.1)
Basophils Relative: 2 %
Eosinophils Absolute: 0 10*3/uL (ref 0.0–0.5)
Eosinophils Relative: 1 %
HCT: 36.3 % (ref 36.0–46.0)
Hemoglobin: 12.6 g/dL (ref 12.0–15.0)
Immature Granulocytes: 0 %
Lymphocytes Relative: 33 %
Lymphs Abs: 1.8 10*3/uL (ref 0.7–4.0)
MCH: 32.4 pg (ref 26.0–34.0)
MCHC: 34.7 g/dL (ref 30.0–36.0)
MCV: 93.3 fL (ref 80.0–100.0)
Monocytes Absolute: 0.3 10*3/uL (ref 0.1–1.0)
Monocytes Relative: 6 %
Neutro Abs: 3.2 10*3/uL (ref 1.7–7.7)
Neutrophils Relative %: 58 %
Platelets: 243 10*3/uL (ref 150–400)
RBC: 3.89 MIL/uL (ref 3.87–5.11)
RDW: 12.1 % (ref 11.5–15.5)
WBC: 5.4 10*3/uL (ref 4.0–10.5)
nRBC: 0.4 % — ABNORMAL HIGH (ref 0.0–0.2)

## 2023-07-02 LAB — TYPE AND SCREEN
ABO/RH(D): A POS
Antibody Screen: NEGATIVE

## 2023-07-02 LAB — URINALYSIS, ROUTINE W REFLEX MICROSCOPIC
Bacteria, UA: NONE SEEN
Bilirubin Urine: NEGATIVE
Glucose, UA: NEGATIVE mg/dL
Ketones, ur: NEGATIVE mg/dL
Nitrite: NEGATIVE
Protein, ur: NEGATIVE mg/dL
Specific Gravity, Urine: 1.005 (ref 1.005–1.030)
pH: 7 (ref 5.0–8.0)

## 2023-07-02 LAB — COMPREHENSIVE METABOLIC PANEL
ALT: 23 U/L (ref 0–44)
AST: 27 U/L (ref 15–41)
Albumin: 3.9 g/dL (ref 3.5–5.0)
Alkaline Phosphatase: 49 U/L (ref 38–126)
Anion gap: 10 (ref 5–15)
BUN: 19 mg/dL (ref 8–23)
CO2: 23 mmol/L (ref 22–32)
Calcium: 9.1 mg/dL (ref 8.9–10.3)
Chloride: 102 mmol/L (ref 98–111)
Creatinine, Ser: 0.79 mg/dL (ref 0.44–1.00)
GFR, Estimated: >60 mL/min (ref >60)
Glucose, Bld: 92 mg/dL (ref 70–99)
Potassium: 4.1 mmol/L (ref 3.5–5.1)
Sodium: 135 mmol/L (ref 135–145)
Total Bilirubin: 0.9 mg/dL (ref 0.0–1.2)
Total Protein: 7.3 g/dL (ref 6.5–8.1)

## 2023-07-02 LAB — C-REACTIVE PROTEIN: CRP: <0.5 mg/dL (ref <1.0)

## 2023-07-02 LAB — SURGICAL PCR SCREEN
MRSA, PCR: NEGATIVE
Staphylococcus aureus: NEGATIVE

## 2023-07-02 LAB — SEDIMENTATION RATE: Sed Rate: 18 mm/h (ref 0–22)

## 2023-07-02 NOTE — Discharge Instructions (Addendum)
Instructions after Total Knee Replacement   James P. Angie Fava., M.D.    Dept. of Orthopaedics & Sports Medicine [facility] [address] [place], [place]  [address]  Phone: [phone]   Fax: [phone]       [email or web address]       DIET: Drink plenty of non-alcoholic fluids. Resume your normal diet. Include foods high in fiber.  ACTIVITY:  You may use crutches or a walker with weight-bearing as tolerated, unless instructed otherwise. You may be weaned off of the walker or crutches by your Physical Therapist.  Do NOT place pillows under the knee. Anything placed under the knee could limit your ability to straighten the knee.   Continue doing gentle exercises. Exercising will reduce the pain and swelling, increase motion, and prevent muscle weakness.   Please continue to use the TED compression stockings for 6 weeks. You may remove the stockings at night, but should reapply them in the morning. Do not drive or operate any equipment until instructed.  WOUND CARE:  Continue to use the PolarCare or ice packs periodically to reduce pain and swelling. You may bathe or shower after the staples are removed at the first office visit following surgery.  MEDICATIONS: You may resume your regular medications. Please take the pain medication as prescribed on the medication. Do not take pain medication on an empty stomach. You have been given a prescription for a blood thinner (Lovenox or Coumadin). Please take the medication as instructed. (NOTE: After completing a 2 week course of Lovenox, take one Enteric-coated aspirin once a day. This along with elevation will help reduce the possibility of phlebitis in your operated leg.) Do not drive or drink alcoholic beverages when taking pain medications.  CALL THE OFFICE FOR: Temperature above 101 degrees Excessive bleeding or drainage on the dressing. Excessive swelling, coldness, or paleness of the toes. Persistent nausea and  vomiting.  FOLLOW-UP:  You should have an appointment to return to the office in 10-14 days after surgery. Arrangements have been made for continuation of Physical Therapy (either home therapy or outpatient therapy).  [facility] Department Directory         [email or web address]       [email or web address]          Cardiology  Appointments: [place] - [phone]  Endocrinology  Appointments: [place] [phone] [place] - [phone]  Gastroenterology  Appointments: [place] [phone] [place] - [phone]        General Surgery   Appointments: [facility]  Internal Medicine/Family Medicine  Appointments: [facility] [place] - [phone] [place] - [phone]  Metabolic and Weigh Loss Surgery  Appointments: [facility]        Neurology  Appointments: [place] [phone] [place] - [phone]  Neurosurgery  Appointments: [place]  Obstetrics & Gynecology  Appointments: [place] [phone] [place] - [phone]        Pediatrics  Appointments: [place] [phone] [place] - [phone]  Physiatry  Appointments: [place] [phone]  Physical Therapy  Appointments: [place] - [phone]        Podiatry  Appointments: [place] [phone] [place] - [phone]  Pulmonology  Appointments: [place]  Rheumatology  Appointments: [place] [phone]        [place] Location: [facility]  [address] [place], [place]  [address]  [place] Location: [facility] [address] [place], [place]  [address]  [place] Location: [facility] [address] [facility]  [address] [place], [place]  [address]      [employer]  [address] [place], [place], [address]. [phone] They will call you to arrange when they can come to see you

## 2023-07-02 NOTE — Patient Instructions (Addendum)
Your procedure is scheduled on: Wednesday, January 29 Report to the Registration Desk on the 1st floor of the [facility] To find out your arrival time, please call [phone] between 1PM - 3PM on: Tuesday, January 28 If your arrival time is 6:00 am, do not arrive before that time as the [facility] entrance doors do not open until 6:00 am.  REMEMBER: Instructions that are not followed completely may result in serious medical risk, up to and including death; or upon the discretion of your surgeon and anesthesiologist your surgery may need to be rescheduled.  Do not eat food after midnight the night before surgery.  No gum chewing or hard candies.  You may however, drink CLEAR liquids up to 2 hours before you are scheduled to arrive for your surgery. Do not drink anything within 2 hours of your scheduled arrival time.  Clear liquids include: - water  - apple juice without pulp - gatorade (not RED colors) - black coffee or tea (Do NOT add milk or creamers to the coffee or tea) Do NOT drink anything that is not on this list.  In addition, your doctor has ordered for you to drink the provided:  Ensure Pre-Surgery Clear Carbohydrate Drink  Drinking this carbohydrate drink up to two hours before surgery helps to reduce insulin resistance and improve patient outcomes. Please complete drinking 2 hours before scheduled arrival time.  One week prior to surgery: starting January 23 Stop Anti-inflammatories (NSAIDS) such as Advil, Aleve, Ibuprofen, Motrin, Naproxen, Naprosyn and Aspirin based products such as Excedrin, Goody's Powder, BC Powder. Stop ANY OVER THE COUNTER supplements until after surgery. Stop Lutein.  You may however, continue to take Tylenol if needed for pain up until the day of surgery.  Continue taking all of your other prescription medications up until the day of surgery.  ON THE DAY OF SURGERY ONLY TAKE THESE MEDICATIONS WITH SIPS OF WATER:  ezetimibe (ZETIA)   pravastatin (PRAVACHOL)   No Alcohol for 24 hours before or after surgery.  No Smoking including e-cigarettes for 24 hours before surgery.  No chewable tobacco products for at least 6 hours before surgery.  No nicotine patches on the day of surgery.  Do not use any "recreational" drugs for at least a week (preferably 2 weeks) before your surgery.  Please be advised that the combination of cocaine and anesthesia may have negative outcomes, up to and including death. If you test positive for cocaine, your surgery will be cancelled.  On the morning of surgery brush your teeth with toothpaste and water, you may rinse your mouth with mouthwash if you wish. Do not swallow any toothpaste or mouthwash.  Use CHG Soap as directed on instruction sheet.  Do not wear jewelry, make-up, hairpins, clips or nail polish.  For welded (permanent) jewelry: bracelets, anklets, waist bands, etc.  Please have this removed prior to surgery.  If it is not removed, there is a chance that hospital personnel will need to cut it off on the day of surgery.  Do not wear lotions, powders, or perfumes.   Do not shave body hair from the neck down 48 hours before surgery.  Contact lenses, hearing aids and dentures may not be worn into surgery.  Do not bring valuables to the hospital. [facility] is not responsible for any missing/lost belongings or valuables.   Notify your doctor if there is any change in your medical condition (cold, fever, infection).  Wear comfortable clothing (specific to your surgery type)  to the hospital.  After surgery, you can help prevent lung complications by doing breathing exercises.  Take deep breaths and cough every 1-2 hours. Your doctor may order a device called an Incentive Spirometer to help you take deep breaths.  If you are being admitted to the hospital overnight, leave your suitcase in the car. After surgery it may be brought to your room.  In case of increased patient  census, it may be necessary for you, the patient, to continue your postoperative care in the Same Day Surgery department.  If you are being discharged the day of surgery, you will not be allowed to drive home. You will need a responsible individual to drive you home and stay with you for 24 hours after surgery.   If you are taking public transportation, you will need to have a responsible individual with you.  Please call the Pre-admissions Testing Dept. at [phone] if you have any questions about these instructions.  Surgery Visitation Policy:  Patients having surgery or a procedure may have two visitors.  Children under the age of 31 must have an adult with them who is not the patient.  Temporary Visitor Restrictions Due to increasing cases of flu, RSV and COVID-19: Children ages 53 and under will not be able to visit patients in [facility] hospitals under most circumstances.  Inpatient Visitation:    Visiting hours are 7 a.m. to 8 p.m. Up to four visitors are allowed at one time in a patient room. The visitors may rotate out with other people during the day.  One visitor age 61 or older may stay with the patient overnight and must be in the room by 8 p.m.      Pre-operative 5 CHG Bath Instructions   You can play a key role in reducing the risk of infection after surgery. Your skin needs to be as free of germs as possible. You can reduce the number of germs on your skin by washing with CHG (chlorhexidine gluconate) soap before surgery. CHG is an antiseptic soap that kills germs and continues to kill germs even after washing.   DO NOT use if you have an allergy to chlorhexidine/CHG or antibacterial soaps. If your skin becomes reddened or irritated, stop using the CHG and notify one of our RNs at [phone].   Please shower with the CHG soap starting 4 days before surgery using the following schedule:     Please keep in mind the following:  DO NOT shave, including legs  and underarms, starting the day of your first shower.   You may shave your face at any point before/day of surgery.  Place clean sheets on your bed the day you start using CHG soap. Use a clean washcloth (not used since being washed) for each shower. DO NOT sleep with pets once you start using the CHG.   CHG Shower Instructions:  If you choose to wash your hair and private area, wash first with your normal shampoo/soap.  After you use shampoo/soap, rinse your hair and body thoroughly to remove shampoo/soap residue.  Turn the water OFF and apply about 3 tablespoons (45 ml) of CHG soap to a CLEAN washcloth.  Apply CHG soap ONLY FROM YOUR NECK DOWN TO YOUR TOES (washing for 3-5 minutes)  DO NOT use CHG soap on face, private areas, open wounds, or sores.  Pay special attention to the area where your surgery is being performed.  If you are having back surgery, having someone wash your  back for you may be helpful. Wait 2 minutes after CHG soap is applied, then you may rinse off the CHG soap.  Pat dry with a clean towel  Put on clean clothes/pajamas   If you choose to wear lotion, please use ONLY the CHG-compatible lotions on the back of this paper.     Additional instructions for the day of surgery: DO NOT APPLY any lotions, deodorants, cologne, or perfumes.   Put on clean/comfortable clothes.  Brush your teeth.  Ask your nurse before applying any prescription medications to the skin.      CHG Compatible Lotions   Aveeno Moisturizing lotion  Cetaphil Moisturizing Cream  Cetaphil Moisturizing Lotion  Clairol Herbal Essence Moisturizing Lotion, Dry Skin  Clairol Herbal Essence Moisturizing Lotion, Extra Dry Skin  Clairol Herbal Essence Moisturizing Lotion, Normal Skin  Curel Age Defying Therapeutic Moisturizing Lotion with Alpha Hydroxy  Curel Extreme Care Body Lotion  Curel Soothing Hands Moisturizing Hand Lotion  Curel Therapeutic Moisturizing Cream, Fragrance-Free  Curel  Therapeutic Moisturizing Lotion, Fragrance-Free  Curel Therapeutic Moisturizing Lotion, Original Formula  Eucerin Daily Replenishing Lotion  Eucerin Dry Skin Therapy Plus Alpha Hydroxy Crme  Eucerin Dry Skin Therapy Plus Alpha Hydroxy Lotion  Eucerin Original Crme  Eucerin Original Lotion  Eucerin Plus Crme Eucerin Plus Lotion  Eucerin TriLipid Replenishing Lotion  Keri Anti-Bacterial Hand Lotion  Keri Deep Conditioning Original Lotion Dry Skin Formula Softly Scented  Keri Deep Conditioning Original Lotion, Fragrance Free Sensitive Skin Formula  Keri Lotion Fast Absorbing Fragrance Free Sensitive Skin Formula  Keri Lotion Fast Absorbing Softly Scented Dry Skin Formula  Keri Original Lotion  Keri Skin Renewal Lotion Keri Silky Smooth Lotion  Keri Silky Smooth Sensitive Skin Lotion  Nivea Body Creamy Conditioning Oil  Nivea Body Extra Enriched Lotion  Nivea Body Original Lotion  Nivea Body Sheer Moisturizing Lotion Nivea Crme  Nivea Skin Firming Lotion  NutraDerm 30 Skin Lotion  NutraDerm Skin Lotion  NutraDerm Therapeutic Skin Cream  NutraDerm Therapeutic Skin Lotion  ProShield Protective Hand Cream  Provon moisturizing lotion     Preoperative Educational Videos for Total Hip, Knee and Shoulder Replacements  To better prepare for surgery, please view our videos that explain the physical activity and discharge planning required to have the best surgical recovery at [facility].  [email or web address]  Questions? Call [phone] or email [email or web address]

## 2023-07-05 DIAGNOSIS — M1612 Unilateral primary osteoarthritis, left hip: Principal | ICD-10-CM | POA: Insufficient documentation

## 2023-07-07 MED ORDER — DEXAMETHASONE SODIUM PHOSPHATE 10 MG/ML IJ SOLN
8.0000 mg | Freq: Once | INTRAMUSCULAR | Status: AC
  Administered 2023-07-08: 8 mg via INTRAVENOUS

## 2023-07-07 MED ORDER — CHLORHEXIDINE GLUCONATE 4 % EX SOLN
60.0000 mL | Freq: Once | CUTANEOUS | Status: AC
  Administered 2023-07-08: 4 via TOPICAL

## 2023-07-07 MED ORDER — CEFAZOLIN SODIUM-DEXTROSE 2-4 GM/100ML-% IV SOLN
2.0000 g | INTRAVENOUS | Status: AC
  Administered 2023-07-08: 2 g via INTRAVENOUS

## 2023-07-07 MED ORDER — CHLORHEXIDINE GLUCONATE 0.12 % MT SOLN
15.0000 mL | Freq: Once | OROMUCOSAL | Status: AC
  Administered 2023-07-08: 15 mL via OROMUCOSAL

## 2023-07-07 MED ORDER — GABAPENTIN 300 MG PO CAPS
300.0000 mg | ORAL_CAPSULE | Freq: Once | ORAL | Status: AC
  Administered 2023-07-08: 300 mg via ORAL

## 2023-07-07 MED ORDER — CELECOXIB 200 MG PO CAPS
400.0000 mg | ORAL_CAPSULE | Freq: Once | ORAL | Status: AC
  Administered 2023-07-08: 400 mg via ORAL

## 2023-07-07 MED ORDER — ORAL CARE MOUTH RINSE: 15.0000 mL | Freq: Once | OROMUCOSAL | Status: AC

## 2023-07-07 MED ORDER — LACTATED RINGERS IV SOLN: INTRAVENOUS | Status: DC

## 2023-07-07 NOTE — H&P (Signed)
ORTHOPAEDIC HISTORY & PHYSICAL Angelica Newton, Adelina Mings., MD - 06/30/2023 10:00 AM EST Formatting of this note is different from the original. Images from the original note were not included. Chief Complaint: Chief Complaint Patient presents with Left hip degenerative arthrosis  Reason for Visit: The patient is a 73 y.o. female who presents today for reevaluation of her left hip. She reports a 1 year history of right hip and groin pain. The pain is worse with weight bearing. She reports difficulty with stair ambulation and getting in/out of a car. The hip pain limits the patient's ability to ambulate long distances. The patient has not appreciated any significant improvement despite NSAIDs and activity modification. She is not using any ambulatory aids. The patient states that the hip pain has progressed to the point that it is significantly interfering with her activities of daily living.  Of note, she is over 4 years status post right total hip arthroplasty. The right hip continues to do well without any pain.  Medications: Current Outpatient Medications Medication Sig Dispense Refill amoxicillin (AMOXIL) 500 MG capsule Take 4 capsules by mouth 1 HOUR BEFORE DENTAL PROCEDURE cholecalciferol (VITAMIN D3) 2,000 unit tablet Take 2,000 Units by mouth once daily ezetimibe (ZETIA) 10 mg tablet Take 10 mg by mouth once daily pravastatin (PRAVACHOL) 20 MG tablet Take 40 mg by mouth once daily  No current facility-administered medications for this visit.  Allergies: Allergies Allergen Reactions Atorvastatin Other (See Comments) "feels terrible"  Mango Rash Swelling of lips (No problem with mango butter) Simvastatin Other (See Comments) Flu type symptoms Tetracyclines Rash Just felt lousey  Past Medical History: Past Medical History: Diagnosis Date Anemia 08/21/2021 Arthritis of hip Depression Hearing loss wears bilateral hearing aids Hyperlipidemia Left bundle branch block  (LBBB) 03/02/2019 Osteoporosis 07/20/2017 Last Assessment & Plan: Discussed treatment options. She does not want medication for this. She will start weightbearing exercise. Continue adequate calcium and vitamin D intake.  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 [employer] Tobacco Use Smoking status: Former Current packs/day: 0.00 Types: Cigarettes Quit date: 06/09/2010 Years since quitting: 13.0 Smokeless tobacco: Never Vaping Use Vaping status: Never Used Substance and Sexual Activity Alcohol use: Yes Alcohol/week: 10.0 standard drinks of alcohol Types: 10 Glasses of wine per week Drug use: Defer Sexual activity: Defer Partners: Male  Social Drivers of Health  Financial Resource Strain: Low Risk (01/28/2023) Received from [facility] Health Overall Financial Resource Strain (CARDIA) Difficulty of Paying Living Expenses: Not hard at all Food Insecurity: No Food Insecurity (01/28/2023) Received from [facility] Hunger Vital Sign Worried About Running Out of Food in the Last Year: Never true Ran Out of Food in the Last Year: Never true Transportation Needs: No Transportation Needs (01/28/2023) Received from [facility] - Transportation Lack of Transportation (Medical): No Lack of Transportation (Non-Medical): No Physical Activity: Sufficiently Active (01/28/2023) Received from [facility] Exercise Vital Sign Days of Exercise per Week: 4 days Minutes of Exercise per Session: 60 min Stress: No Stress  Concern Present (01/28/2023) Received from [facility] - Occupational Stress Questionnaire Feeling of Stress : Not at all  Social Connections: Unknown (01/28/2023) Received from [facility] Social Connection and Isolation Panel [NHANES] Frequency of Communication with Friends and Family: Three times a week Frequency of Social Gatherings with Friends and Family: Three times a week Active Member of Clubs or Organizations: Yes Attends Banker Meetings: More than 4 times per year Marital Status: Divorced Housing Stability: Unknown (06/25/2023) Housing Stability Vital Sign Homeless in the Last Year: No  Family History: Family History Problem Relation Name Age of Onset Diabetes Mother Hyperlipidemia (Elevated cholesterol) Mother Stroke Mother Hyperlipidemia (Elevated cholesterol) Father Stroke Father High blood pressure (Hypertension) Father Kidney failure Father Diabetes Father Cancer Brother Diabetes Maternal Grandmother Breast cancer Paternal Grandmother  Review of Systems: A comprehensive 14 point ROS was performed, reviewed, and the pertinent orthopaedic findings are documented in the HPI.  Exam BP (!) 142/82  Ht 175.3 cm (5\' 9" )  Wt 70.1 kg (154 lb 9.6 oz)  BMI 22.83 kg/m  General: Well-developed, well-nourished female seen in no acute distress. Antalgic gait without evidence of significant abductor lurch.  HEENT: Atraumatic, normocephalic. Pupils are equal and reactive to light. Extraocular motion is intact. Sclera are clear. Oropharynx is clear with moist mucosa.  Neck: Supple, nontender, and with good ROM. No thyromegaly, adenopathy, JVD, or carotid bruits.  Lungs: Clear to auscultation bilaterally.  Cardiovascular: Regular rate and rhythm. Normal S1, S2. No murmur . No appreciable gallops or rubs. Peripheral pulses are palpable. No lower extremity edema. Homan`s test is negative.  Abdomen: Soft, nontender,  nondistended. Bowel sounds are present.  Spine: Alignment: No gross scoliosis. Normal lumbar lordosis. Sacroiliac joints: Nontender to palpation Patrick`s test: Negative Flip test: Negative Tenderness: None Paraspinous spasm: None Range of motion: Good range of motion with both flexion and extension  Extremities: Good strength, stability, and range of motion of the upper extremities. Good range of motion of the knees and ankles.  Left Hip: Pelvic tilt: Negative Limb lengths: Equal with the patient standing Soft tissue swelling: Negative Erythema: Negative Crepitance: Negative Tenderness: Greater trochanter is nontender to palpation. Moderate pain is elicited by axial compression or extremes of rotation. Atrophy: No atrophy. Good hip flexor and abductor strength. Range of Motion: EXT/FLEX: 0/0/110 ADD/ABD: 30/0/20 IR/ER: 0/0/40  Vascular: Peripheral pulses are palpable. Good capillary refill. No gross pretibial or ankle edema. Homans test is negative.  Neurologic: Awake, alert, and oriented. Sensory function is intact to pinprick and light touch. Motor strength is judged to be 5/5. Motor coordination is grossly within normal limits. No apparent clonus. No tremor.  X-rays: I reviewed the left hip radiographs that were performed at [facility] on 05/04/[facility]. There is significant narrowing of the cartilage space superiorly with bone-on-bone articulation. Subchondral sclerosis is noted. Osteophyte formation is present.  Impression: Degenerative arthrosis of the left hip  Plan: The findings were discussed in detail with the patient. The patient was given informational material on total hip replacement. Conservative treatment options were reviewed with the patient. We discussed the risks and benefits of surgical intervention. The usual perioperative course was also discussed in detail. The patient expressed understanding of the risks and benefits of surgical intervention and  would like to proceed with plans for left total hip arthroplasty.  I spent a total of 45 minutes in both face-to-face and non-face-to-face activities, excluding procedures performed, for this visit on the date of this encounter.  MEDICAL CLEARANCE: Per anesthesiology ACTIVITIES: As tolerated. WORK STATUS: Not applicable. THERAPY: Preoperative physical therapy evaluation. MEDICATIONS: Requested Prescriptions  No prescriptions requested or ordered  in this encounter  FOLLOW-UP: Return for postoperative follow-up.  Fergus Throne P. Angie Fava., M.D.  This note was generated in part with voice recognition software and I apologize for any typographical errors that were not detected and corrected. Electronically signed by Shari Heritage., MD at 07/05/2023 7:50 AM EST

## 2023-07-08 ENCOUNTER — Encounter: Payer: Self-pay | Admitting: Orthopedic Surgery

## 2023-07-08 ENCOUNTER — Observation Stay: Payer: Medicare Other

## 2023-07-08 ENCOUNTER — Observation Stay
Admission: RE | Admit: 2023-07-08 | Discharge: 2023-07-09 | Disposition: A | Discharge status: Home / Self Care | Payer: Medicare Other | Source: Ambulatory Visit | Attending: Orthopedic Surgery | Admitting: Orthopedic Surgery

## 2023-07-08 ENCOUNTER — Ambulatory Visit: Payer: Medicare Other | Admitting: Urgent Care

## 2023-07-08 ENCOUNTER — Other Ambulatory Visit: Payer: Self-pay

## 2023-07-08 ENCOUNTER — Encounter
Admission: RE | Disposition: A | Discharge status: Home / Self Care | Payer: Self-pay | Source: Ambulatory Visit | Attending: Orthopedic Surgery

## 2023-07-08 DIAGNOSIS — Z96642 Presence of left artificial hip joint: Secondary | ICD-10-CM

## 2023-07-08 DIAGNOSIS — M791 Myalgia, unspecified site: Secondary | ICD-10-CM

## 2023-07-08 DIAGNOSIS — M1612 Unilateral primary osteoarthritis, left hip: Principal | ICD-10-CM | POA: Insufficient documentation

## 2023-07-08 DIAGNOSIS — D649 Anemia, unspecified: Secondary | ICD-10-CM

## 2023-07-08 DIAGNOSIS — Z01812 Encounter for preprocedural laboratory examination: Secondary | ICD-10-CM

## 2023-07-08 DIAGNOSIS — Z96643 Presence of artificial hip joint, bilateral: Secondary | ICD-10-CM | POA: Diagnosis not present

## 2023-07-08 DIAGNOSIS — Z96641 Presence of right artificial hip joint: Secondary | ICD-10-CM | POA: Insufficient documentation

## 2023-07-08 DIAGNOSIS — Z87891 Personal history of nicotine dependence: Secondary | ICD-10-CM | POA: Insufficient documentation

## 2023-07-08 DIAGNOSIS — M5412 Radiculopathy, cervical region: Secondary | ICD-10-CM

## 2023-07-08 HISTORY — PX: TOTAL HIP ARTHROPLASTY: SHX124

## 2023-07-08 SURGERY — ARTHROPLASTY, HIP, TOTAL,POSTERIOR APPROACH
Anesthesia: Spinal | Site: Hip | Laterality: Left

## 2023-07-08 MED ORDER — SODIUM CHLORIDE 0.9 % IR SOLN
Status: DC | PRN
  Administered 2023-07-08: 3000 mL

## 2023-07-08 MED ORDER — MIDAZOLAM HCL 2 MG/2ML IJ SOLN
INTRAMUSCULAR | Status: AC
  Filled 2023-07-08: qty 2

## 2023-07-08 MED ORDER — TRANEXAMIC ACID-NACL 1000-0.7 MG/100ML-% IV SOLN
INTRAVENOUS | Status: AC
  Filled 2023-07-08: qty 100

## 2023-07-08 MED ORDER — OXYCODONE HCL 5 MG PO TABS: 5.0000 mg | ORAL_TABLET | ORAL | Status: DC | PRN

## 2023-07-08 MED ORDER — PROPOFOL 500 MG/50ML IV EMUL
INTRAVENOUS | Status: DC | PRN
  Administered 2023-07-08: 100 ug/kg/min via INTRAVENOUS

## 2023-07-08 MED ORDER — PROPOFOL 1000 MG/100ML IV EMUL
INTRAVENOUS | Status: AC
  Filled 2023-07-08: qty 100

## 2023-07-08 MED ORDER — BUPIVACAINE HCL (PF) 0.5 % IJ SOLN
INTRAMUSCULAR | Status: DC | PRN
  Administered 2023-07-08: 2.8 mL

## 2023-07-08 MED ORDER — CEFAZOLIN SODIUM-DEXTROSE 2-4 GM/100ML-% IV SOLN
2.0000 g | Freq: Four times a day (QID) | INTRAVENOUS | Status: AC
  Administered 2023-07-08 (×2): 2 g via INTRAVENOUS
  Filled 2023-07-08: qty 100

## 2023-07-08 MED ORDER — FERROUS SULFATE 325 (65 FE) MG PO TABS
325.0000 mg | ORAL_TABLET | Freq: Two times a day (BID) | ORAL | Status: DC
  Administered 2023-07-08 – 2023-07-09 (×2): 325 mg via ORAL

## 2023-07-08 MED ORDER — ACETAMINOPHEN 10 MG/ML IV SOLN: 1000.0000 mg | Freq: Once | INTRAVENOUS | Status: DC | PRN

## 2023-07-08 MED ORDER — OXYCODONE HCL 5 MG PO TABS: 10.0000 mg | ORAL_TABLET | ORAL | Status: DC | PRN

## 2023-07-08 MED ORDER — HYDROMORPHONE HCL 1 MG/ML IJ SOLN: 0.5000 mg | INTRAMUSCULAR | Status: DC | PRN

## 2023-07-08 MED ORDER — OXYCODONE HCL 5 MG PO TABS
5.0000 mg | ORAL_TABLET | Freq: Once | ORAL | Status: AC | PRN
  Administered 2023-07-08: 5 mg via ORAL

## 2023-07-08 MED ORDER — FENTANYL CITRATE (PF) 100 MCG/2ML IJ SOLN
INTRAMUSCULAR | Status: AC
  Filled 2023-07-08: qty 2

## 2023-07-08 MED ORDER — TRANEXAMIC ACID-NACL 1000-0.7 MG/100ML-% IV SOLN
1000.0000 mg | Freq: Once | INTRAVENOUS | Status: AC
  Administered 2023-07-08: 1000 mg via INTRAVENOUS

## 2023-07-08 MED ORDER — METOCLOPRAMIDE HCL 10 MG PO TABS
ORAL_TABLET | ORAL | Status: AC
  Filled 2023-07-08: qty 1

## 2023-07-08 MED ORDER — DROPERIDOL 2.5 MG/ML IJ SOLN: 0.6250 mg | Freq: Once | INTRAMUSCULAR | Status: DC | PRN

## 2023-07-08 MED ORDER — MAGNESIUM HYDROXIDE 400 MG/5ML PO SUSP
30.0000 mL | Freq: Every day | ORAL | Status: DC
  Administered 2023-07-09: 30 mL via ORAL

## 2023-07-08 MED ORDER — FENTANYL CITRATE (PF) 100 MCG/2ML IJ SOLN: 25.0000 ug | INTRAMUSCULAR | Status: DC | PRN

## 2023-07-08 MED ORDER — PANTOPRAZOLE SODIUM 40 MG PO TBEC
DELAYED_RELEASE_TABLET | ORAL | Status: AC
  Filled 2023-07-08: qty 1

## 2023-07-08 MED ORDER — ACETAMINOPHEN 10 MG/ML IV SOLN
INTRAVENOUS | Status: AC
  Filled 2023-07-08: qty 100

## 2023-07-08 MED ORDER — SURGIPHOR WOUND IRRIGATION SYSTEM - OPTIME: TOPICAL | Status: DC | PRN

## 2023-07-08 MED ORDER — CHLORHEXIDINE GLUCONATE 0.12 % MT SOLN
OROMUCOSAL | Status: AC
  Filled 2023-07-08: qty 15

## 2023-07-08 MED ORDER — SODIUM CHLORIDE 0.9 % IV SOLN
INTRAVENOUS | Status: DC
Start: 2023-07-08 — End: 2023-07-09

## 2023-07-08 MED ORDER — MIDAZOLAM HCL 5 MG/5ML IJ SOLN
INTRAMUSCULAR | Status: DC | PRN
  Administered 2023-07-08: 1 mg via INTRAVENOUS
  Administered 2023-07-08: 2 mg via INTRAVENOUS
  Administered 2023-07-08: 1 mg via INTRAVENOUS

## 2023-07-08 MED ORDER — OXYCODONE HCL 5 MG PO TABS
ORAL_TABLET | ORAL | Status: AC
  Filled 2023-07-08: qty 1

## 2023-07-08 MED ORDER — CELECOXIB 200 MG PO CAPS
ORAL_CAPSULE | ORAL | Status: AC
  Filled 2023-07-08: qty 1

## 2023-07-08 MED ORDER — ASPIRIN 81 MG PO CHEW
CHEWABLE_TABLET | ORAL | Status: AC
  Filled 2023-07-08: qty 1

## 2023-07-08 MED ORDER — FLEET ENEMA RE ENEM: 1.0000 | ENEMA | Freq: Once | RECTAL | Status: DC | PRN

## 2023-07-08 MED ORDER — DIPHENHYDRAMINE HCL 12.5 MG/5ML PO ELIX: 12.5000 mg | ORAL_SOLUTION | ORAL | Status: DC | PRN

## 2023-07-08 MED ORDER — CEFAZOLIN SODIUM-DEXTROSE 2-4 GM/100ML-% IV SOLN
INTRAVENOUS | Status: AC
  Filled 2023-07-08: qty 100

## 2023-07-08 MED ORDER — BISACODYL 10 MG RE SUPP: 10.0000 mg | Freq: Every day | RECTAL | Status: DC | PRN

## 2023-07-08 MED ORDER — ASPIRIN 81 MG PO CHEW
81.0000 mg | CHEWABLE_TABLET | Freq: Two times a day (BID) | ORAL | Status: DC
  Administered 2023-07-08 – 2023-07-09 (×2): 81 mg via ORAL

## 2023-07-08 MED ORDER — TRANEXAMIC ACID-NACL 1000-0.7 MG/100ML-% IV SOLN
1000.0000 mg | INTRAVENOUS | Status: AC
Start: 2023-07-08 — End: 2023-07-08
  Administered 2023-07-08: 1000 mg via INTRAVENOUS

## 2023-07-08 MED ORDER — PHENYLEPHRINE HCL-NACL 20-0.9 MG/250ML-% IV SOLN
INTRAVENOUS | Status: DC | PRN
  Administered 2023-07-08: 25 ug/min via INTRAVENOUS

## 2023-07-08 MED ORDER — SENNOSIDES-DOCUSATE SODIUM 8.6-50 MG PO TABS
ORAL_TABLET | ORAL | Status: AC
  Filled 2023-07-08: qty 1

## 2023-07-08 MED ORDER — EPHEDRINE SULFATE (PRESSORS) 50 MG/ML IJ SOLN
INTRAMUSCULAR | Status: DC | PRN
  Administered 2023-07-08: 10 mg via INTRAVENOUS

## 2023-07-08 MED ORDER — ONDANSETRON HCL 4 MG/2ML IJ SOLN
INTRAMUSCULAR | Status: DC | PRN
  Administered 2023-07-08: 4 mg via INTRAVENOUS

## 2023-07-08 MED ORDER — SENNOSIDES-DOCUSATE SODIUM 8.6-50 MG PO TABS
1.0000 | ORAL_TABLET | Freq: Two times a day (BID) | ORAL | Status: DC
  Administered 2023-07-08 – 2023-07-09 (×2): 1 via ORAL

## 2023-07-08 MED ORDER — MENTHOL 3 MG MT LOZG: 1.0000 | LOZENGE | OROMUCOSAL | Status: DC | PRN

## 2023-07-08 MED ORDER — PRAVASTATIN SODIUM 20 MG PO TABS
20.0000 mg | ORAL_TABLET | Freq: Every day | ORAL | Status: DC
  Administered 2023-07-09: 20 mg via ORAL

## 2023-07-08 MED ORDER — PHENYLEPHRINE 80 MCG/ML (10ML) SYRINGE FOR IV PUSH (FOR BLOOD PRESSURE SUPPORT)
PREFILLED_SYRINGE | INTRAVENOUS | Status: AC
  Filled 2023-07-08: qty 10

## 2023-07-08 MED ORDER — 0.9 % SODIUM CHLORIDE (POUR BTL) OPTIME
TOPICAL | Status: DC | PRN
  Administered 2023-07-08: 500 mL

## 2023-07-08 MED ORDER — ACETAMINOPHEN 10 MG/ML IV SOLN
INTRAVENOUS | Status: DC | PRN
  Administered 2023-07-08: 1000 mg via INTRAVENOUS

## 2023-07-08 MED ORDER — EZETIMIBE 10 MG PO TABS
10.0000 mg | ORAL_TABLET | Freq: Every day | ORAL | Status: DC
  Administered 2023-07-09: 10 mg via ORAL

## 2023-07-08 MED ORDER — PHENYLEPHRINE HCL-NACL 20-0.9 MG/250ML-% IV SOLN
INTRAVENOUS | Status: AC
  Filled 2023-07-08: qty 250

## 2023-07-08 MED ORDER — CELECOXIB 200 MG PO CAPS
ORAL_CAPSULE | ORAL | Status: AC
  Filled 2023-07-08: qty 2

## 2023-07-08 MED ORDER — ACETAMINOPHEN 325 MG PO TABS: 325.0000 mg | ORAL_TABLET | Freq: Four times a day (QID) | ORAL | Status: DC | PRN

## 2023-07-08 MED ORDER — CELECOXIB 200 MG PO CAPS
200.0000 mg | ORAL_CAPSULE | Freq: Two times a day (BID) | ORAL | Status: DC
  Administered 2023-07-08 – 2023-07-09 (×2): 200 mg via ORAL

## 2023-07-08 MED ORDER — PANTOPRAZOLE SODIUM 40 MG PO TBEC
40.0000 mg | DELAYED_RELEASE_TABLET | Freq: Two times a day (BID) | ORAL | Status: DC
  Administered 2023-07-08 – 2023-07-09 (×3): 40 mg via ORAL

## 2023-07-08 MED ORDER — DEXAMETHASONE SODIUM PHOSPHATE 10 MG/ML IJ SOLN
INTRAMUSCULAR | Status: AC
  Filled 2023-07-08: qty 1

## 2023-07-08 MED ORDER — ACETAMINOPHEN 10 MG/ML IV SOLN
1000.0000 mg | Freq: Four times a day (QID) | INTRAVENOUS | Status: DC
  Administered 2023-07-08 – 2023-07-09 (×3): 1000 mg via INTRAVENOUS

## 2023-07-08 MED ORDER — ENSURE PRE-SURGERY PO LIQD
296.0000 mL | Freq: Once | ORAL | Status: AC
  Administered 2023-07-08: 296 mL via ORAL
  Filled 2023-07-08: qty 296

## 2023-07-08 MED ORDER — GABAPENTIN 300 MG PO CAPS
ORAL_CAPSULE | ORAL | Status: AC
  Filled 2023-07-08: qty 1

## 2023-07-08 MED ORDER — FENTANYL CITRATE (PF) 100 MCG/2ML IJ SOLN
INTRAMUSCULAR | Status: DC | PRN
  Administered 2023-07-08: 50 ug via INTRAVENOUS
  Administered 2023-07-08 (×2): 25 ug via INTRAVENOUS

## 2023-07-08 MED ORDER — PHENOL 1.4 % MT LIQD: 1.0000 | OROMUCOSAL | Status: DC | PRN

## 2023-07-08 MED ORDER — ALUM & MAG HYDROXIDE-SIMETH 200-200-20 MG/5ML PO SUSP: 30.0000 mL | ORAL | Status: DC | PRN

## 2023-07-08 MED ORDER — METOCLOPRAMIDE HCL 10 MG PO TABS
10.0000 mg | ORAL_TABLET | Freq: Three times a day (TID) | ORAL | Status: DC
  Administered 2023-07-08 – 2023-07-09 (×3): 10 mg via ORAL

## 2023-07-08 MED ORDER — FERROUS SULFATE 325 (65 FE) MG PO TABS
ORAL_TABLET | ORAL | Status: AC
Start: 2023-07-08 — End: ?
  Filled 2023-07-08: qty 1

## 2023-07-08 MED ORDER — ONDANSETRON HCL 4 MG/2ML IJ SOLN: 4.0000 mg | Freq: Four times a day (QID) | INTRAMUSCULAR | Status: DC | PRN

## 2023-07-08 MED ORDER — TRAMADOL HCL 50 MG PO TABS
50.0000 mg | ORAL_TABLET | ORAL | Status: DC | PRN
Start: 2023-07-08 — End: 2023-07-09

## 2023-07-08 MED ORDER — ONDANSETRON HCL 4 MG PO TABS
4.0000 mg | ORAL_TABLET | Freq: Four times a day (QID) | ORAL | Status: DC | PRN
Start: 2023-07-08 — End: 2023-07-09

## 2023-07-08 MED ORDER — OXYCODONE HCL 5 MG/5ML PO SOLN: 5.0000 mg | Freq: Once | ORAL | Status: AC | PRN

## 2023-07-08 SURGICAL SUPPLY — 47 items
BLADE CLIPPER SURG (BLADE) IMPLANT
BLADE SAW 90X25X1.19 OSCILLAT (BLADE) ×1 IMPLANT
BRUSH SCRUB EZ PLAIN DRY (MISCELLANEOUS) ×1 IMPLANT
CUP ACETBLR 54 OD 100 SERIES (Hips) IMPLANT
DRAPE INCISE IOBAN 66X60 STRL (DRAPES) ×1 IMPLANT
DRAPE SHEET LG 3/4 BI-LAMINATE (DRAPES) ×1 IMPLANT
DRSG AQUACEL AG ADV 3.5X14 (GAUZE/BANDAGES/DRESSINGS) ×1 IMPLANT
DRSG MEPILEX SACRM 8.7X9.8 (GAUZE/BANDAGES/DRESSINGS) ×1 IMPLANT
DRSG TEGADERM 4X4.75 (GAUZE/BANDAGES/DRESSINGS) ×1 IMPLANT
DURAPREP 26ML APPLICATOR (WOUND CARE) ×2 IMPLANT
ELECT CAUTERY BLADE 6.4 (BLADE) ×1 IMPLANT
ELECT REM PT RETURN 9FT ADLT (ELECTROSURGICAL) ×1
ELECTRODE REM PT RTRN 9FT ADLT (ELECTROSURGICAL) ×1 IMPLANT
EVACUATOR 1/8 PVC DRAIN (DRAIN) ×1 IMPLANT
GLOVE BIOGEL M STRL SZ7.5 (GLOVE) ×6 IMPLANT
GLOVE SURG UNDER POLY LF SZ8 (GLOVE) ×2 IMPLANT
GOWN STRL REUS W/ TWL LRG LVL3 (GOWN DISPOSABLE) ×2 IMPLANT
GOWN STRL REUS W/ TWL XL LVL3 (GOWN DISPOSABLE) ×1 IMPLANT
GOWN TOGA ZIPPER T7+ PEEL AWAY (MISCELLANEOUS) ×1 IMPLANT
HANDLE YANKAUER SUCT OPEN TIP (MISCELLANEOUS) ×1 IMPLANT
HEAD M SROM 36MM PLUS 1.5 (Hips) IMPLANT
HOLDER FOLEY CATH W/STRAP (MISCELLANEOUS) ×1 IMPLANT
HOOD PEEL AWAY T7 (MISCELLANEOUS) ×1 IMPLANT
IV NS IRRIG 3000ML ARTHROMATIC (IV SOLUTION) ×1 IMPLANT
KIT PEG BOARD PINK (KITS) ×1 IMPLANT
KIT TURNOVER KIT A (KITS) ×1 IMPLANT
LINER MARATHON 10D 36X54 (Hips) IMPLANT
MANIFOLD NEPTUNE II (INSTRUMENTS) ×2 IMPLANT
NS IRRIG 500ML POUR BTL (IV SOLUTION) ×1 IMPLANT
PACK HIP PROSTHESIS (MISCELLANEOUS) ×1 IMPLANT
PIN STEIN THRED 5/32 (Pin) ×1 IMPLANT
PULSAVAC PLUS IRRIG FAN TIP (DISPOSABLE) ×1
SOLUTION IRRIG SURGIPHOR (IV SOLUTION) ×1 IMPLANT
SPONGE DRAIN TRACH 4X4 STRL 2S (GAUZE/BANDAGES/DRESSINGS) ×1 IMPLANT
SROM M HEAD 36MM PLUS 1.5 (Hips) ×1 IMPLANT
STAPLER SKIN PROX 35W (STAPLE) ×1 IMPLANT
STEM FEM ACTIS STD SZ7 (Nail) IMPLANT
SUT ETHIBOND #5 BRAIDED 30INL (SUTURE) ×1 IMPLANT
SUT VIC AB 0 CT1 36 (SUTURE) ×2 IMPLANT
SUT VIC AB 1 CT1 36 (SUTURE) ×2 IMPLANT
SUT VIC AB 2-0 CT1 TAPERPNT 27 (SUTURE) ×1 IMPLANT
TAPE CLOTH 3X10 WHT NS LF (GAUZE/BANDAGES/DRESSINGS) ×1 IMPLANT
TIP FAN IRRIG PULSAVAC PLUS (DISPOSABLE) ×1 IMPLANT
TOWEL OR 17X26 4PK STRL BLUE (TOWEL DISPOSABLE) IMPLANT
TRAP FLUID SMOKE EVACUATOR (MISCELLANEOUS) ×1 IMPLANT
TRAY FOLEY MTR SLVR 16FR STAT (SET/KITS/TRAYS/PACK) ×1 IMPLANT
WATER STERILE IRR 1000ML POUR (IV SOLUTION) ×1 IMPLANT

## 2023-07-08 NOTE — Progress Notes (Signed)
Patient is not able to walk the distance required to go the bathroom, or he/she is unable to safely negotiate stairs required to access the bathroom.  A 3in1 BSC will alleviate this problem   Raad Clayson P. Angie Fava M.D.

## 2023-07-08 NOTE — Transfer of Care (Signed)
Immediate Anesthesia Transfer of Care Note  Patient: Angelica Newton  Procedure(s) Performed: TOTAL HIP ARTHROPLASTY (Left: Hip)  Patient Location: PACU  Anesthesia Type:Regional  Level of Consciousness: awake, alert , and oriented  Airway & Oxygen Therapy: Patient Spontanous Breathing and Patient connected to face mask oxygen  Post-op Assessment: Post -op Vital signs reviewed and stable  Post vital signs: Reviewed and stable  Last Vitals:  Vitals Value Taken Time  BP 107/61 07/08/23 1100  Temp    Pulse 71 07/08/23 1101  Resp 13 07/08/23 1101  SpO2 98 % 07/08/23 1101  Vitals shown include unfiled device data.  Last Pain:  Vitals:   07/08/23 0620  TempSrc: Temporal  PainSc: 0-No pain         Complications: No notable events documented.

## 2023-07-08 NOTE — TOC Initial Note (Signed)
Transition of Care [facility]) - Initial/Assessment Note    Patient Details  Name: Angelica Newton MRN: [id number] Date of Birth: 24-Dec-1950  Transition of [facility] CM/SW Contact:    Marlowe Sax, RN Phone Number: 07/08/2023, 4:32 PM  Clinical Narrative:                                                    [facility] Is set up for [facility] prior to surgery by Surgeons office        Patient Goals and CMS Choice            Expected Discharge Plan and Services                                              Prior Living Arrangements/Services                       Activities of Daily Living   ADL Screening (condition at time of admission) Independently performs ADLs?: Yes (appropriate for developmental age) Is the patient deaf or have difficulty hearing?: No Does the patient have difficulty seeing, even when wearing glasses/contacts?: No Does the patient have difficulty concentrating, remembering, or making decisions?: No  Permission Sought/Granted                  Emotional Assessment              Admission diagnosis:  Hx of total hip arthroplasty, left [W09.811] Patient Active Problem List   Diagnosis Date Noted   Hx of total hip arthroplasty, left 07/08/2023   Primary osteoarthritis of left hip 07/05/2023   Muscle pain 08/27/2022   History of COVID-19 07/18/2022   Tachycardia 05/12/2022   Bradycardia 05/05/2022   Palpitations 04/07/2022   Anemia 08/21/2021   Cervical radiculopathy 08/21/2020   Left bundle branch block (LBBB) 03/02/2019   Osteoporosis 07/20/2017   Hyperlipidemia 05/10/2015   PCP:  Dana Allan, MD Pharmacy:   [id number] [place], [place] - [address] AT [facility] OF [address] [place] [address] Phone: [phone] Fax: [phone]     Social Drivers of Health (SDOH) Social History: SDOH Screenings   Food Insecurity: No Food Insecurity (07/08/2023)   Housing: Low Risk  (07/08/2023)  Transportation Needs: No Transportation Needs (07/08/2023)  Utilities: Not At Risk (07/08/2023)  Alcohol Screen: Low Risk  (01/28/2023)  Depression (PHQ2-9): Low Risk  (01/30/2023)  Financial Resource Strain: Low Risk  (01/28/2023)  Physical Activity: Sufficiently Active (01/28/2023)  Social Connections: Moderately Integrated (07/08/2023)  Stress: No Stress Concern Present (01/28/2023)  Tobacco Use: Medium Risk (07/08/2023)   SDOH Interventions:     Readmission Risk Interventions     No data to display

## 2023-07-08 NOTE — Op Note (Signed)
OPERATIVE NOTE  DATE OF SURGERY:  07/08/2023  PATIENT NAME:  Angelica Newton   DOB: April 17, 1951  MRN: [id number]  PRE-OPERATIVE DIAGNOSIS: Degenerative arthrosis of the left hip, primary  POST-OPERATIVE DIAGNOSIS:  Same  PROCEDURE:  Left total hip arthroplasty  SURGEON:  Jena Gauss. M.D.  ASSISTANT:  Gean Birchwood, PA-C (present and scrubbed throughout the case, critical for assistance with exposure, retraction, instrumentation, and closure)  ANESTHESIA: spinal  ESTIMATED BLOOD LOSS: 100 mL  FLUIDS REPLACED: 1000 mL of crystalloid  DRAINS: 2 medium Hemovac drains  IMPLANTS UTILIZED: DePuy size 7 standard offset Actis femoral stem, 54 mm OD Pinnacle 100 acetabular component, +4 mm 10 degree Pinnacle Marathon polyethylene insert, and a 36 mm M-SPEC +1.5 mm hip ball  INDICATIONS FOR SURGERY: Angelica Newton is a 73 y.o. year old female with a long history of progressive hip and groin  pain. X-rays demonstrated severe degenerative changes. The patient had not seen any significant improvement despite conservative nonsurgical intervention. After discussion of the risks and benefits of surgical intervention, the patient expressed understanding of the risks benefits and agree with plans for total hip arthroplasty.   The risks, benefits, and alternatives were discussed at length including but not limited to the risks of infection, bleeding, nerve injury, stiffness, blood clots, the need for revision surgery, limb length inequality, dislocation, cardiopulmonary complications, among others, and they were willing to proceed.  PROCEDURE IN DETAIL: The patient was brought into the operating room and, after adequate spinal anesthesia was achieved, the patient was placed in a right lateral decubitus position. Axillary roll was placed and all bony prominences were well-padded. The patient's left hip was cleaned and prepped with alcohol and DuraPrep and draped in the usual sterile  fashion. A "timeout" was performed as per usual protocol. A lateral curvilinear incision was made gently curving towards the posterior superior iliac spine. The IT band was incised in line with the skin incision and the fibers of the gluteus maximus were split in line. The piriformis tendon was identified, skeletonized, and incised at its insertion to the proximal femur and reflected posteriorly. A T type posterior capsulotomy was performed. Prior to dislocation of the femoral head, a threaded Steinmann pin was inserted through a separate stab incision into the pelvis superior to the acetabulum and bent in the form of a stylus so as to assess limb length and hip offset throughout the procedure. The femoral head was then dislocated posteriorly. Inspection of the femoral head demonstrated severe degenerative changes with full-thickness loss of articular cartilage. The femoral neck cut was performed using an oscillating saw. The anterior capsule was elevated off of the femoral neck using a periosteal elevator. Attention was then directed to the acetabulum. The remnant of the labrum was excised using electrocautery. Inspection of the acetabulum also demonstrated significant degenerative changes. The acetabulum was reamed in sequential fashion up to a 53 mm diameter. Good punctate bleeding bone was encountered. A 54 mm Pinnacle 100 acetabular component was positioned and impacted into place. Good scratch fit was appreciated. A +4 mm neutral polyethylene trial was inserted.  Attention was then directed to the proximal femur.  Femoral broaches were inserted in a sequential fashion up to a size 7 broach. Calcar region was planed and a trial reduction was performed using a standard offset neck and a 36 mm hip ball with a +1.5 mm neck length.  Reasonably good stability was noted but it was elected to trial with a +4 mm 10  degree trial liner with the high side position at the 5 o'clock position.  Good equalization of limb  lengths and hip offset was appreciated and excellent stability was noted both anteriorly and posteriorly. Trial components were removed. The acetabular shell was irrigated with copious amounts of normal saline with antibiotic solution and suctioned dry. A +4 mm 10 degree Pinnacle Marathon polyethylene insert was positioned with the high side at the 5 o'clock position and impacted into place. Next, a size 7 standard offset Actis femoral stem was positioned and impacted into place. Excellent scratch fit was appreciated. A trial reduction was again performed with a 36 mm hip ball with a +1.5 mm neck length. Again, good equalization of limb lengths was appreciated and excellent stability appreciated both anteriorly and posteriorly. The hip was then dislocated and the trial hip ball was removed. The Morse taper was cleaned and dried. A 36 mm M-SPEC hip ball with a +1.5 mm neck length was placed on the trunnion and impacted into place. The hip was then reduced and placed through range of motion. Excellent stability was appreciated both anteriorly and posteriorly.  The wound was irrigated with copious amounts of normal saline followed by 450 ml of Surgiphor and suctioned dry. Good hemostasis was appreciated. The posterior capsulotomy was repaired using #5 Ethibond. Piriformis tendon was reapproximated to the undersurface of the gluteus medius tendon using #5 Ethibond. The IT band was reapproximated using interrupted sutures of #1 Vicryl. Subcutaneous tissue was approximated using first #0 Vicryl followed by #2-0 Vicryl. The skin was closed with skin staples.  The patient tolerated the procedure well and was transported to the recovery room in stable condition.   Jena Gauss., M.D.

## 2023-07-08 NOTE — Anesthesia Postprocedure Evaluation (Signed)
Anesthesia Post Note  Patient: Dawne Casali  Procedure(s) Performed: TOTAL HIP ARTHROPLASTY (Left: Hip)  Patient location during evaluation: PACU Anesthesia Type: Spinal Level of consciousness: awake and alert Pain management: pain level controlled Vital Signs Assessment: post-procedure vital signs reviewed and stable Respiratory status: spontaneous breathing, nonlabored ventilation, respiratory function stable and patient connected to nasal cannula oxygen Cardiovascular status: blood pressure returned to baseline and stable Postop Assessment: no apparent nausea or vomiting Anesthetic complications: no   No notable events documented.   Last Vitals:  Vitals:   07/08/23 1130 07/08/23 1145  BP: 107/63 (!) 124/59  Pulse: (!) 54 (!) 56  Resp: 13 12  Temp:    SpO2: 99% 99%    Last Pain:  Vitals:   07/08/23 1102  TempSrc:   PainSc: 0-No pain                 Yevette Edwards

## 2023-07-08 NOTE — Plan of Care (Signed)
continue

## 2023-07-08 NOTE — Anesthesia Preprocedure Evaluation (Signed)
Anesthesia Evaluation  Patient identified by MRN, date of birth, ID band Patient awake    Reviewed: Allergy & Precautions, H&P , NPO status , Patient's Chart, lab work & pertinent test results, reviewed documented beta blocker date and time   History of Anesthesia Complications (+) PONV and history of anesthetic complications  Airway Mallampati: II   Neck ROM: full    Dental  (+) Poor Dentition   Pulmonary neg pulmonary ROS, former smoker   Pulmonary exam normal        Cardiovascular Exercise Tolerance: Good Normal cardiovascular exam+ dysrhythmias  Rhythm:regular Rate:Normal     Neuro/Psych  PSYCHIATRIC DISORDERS  Depression     Neuromuscular disease    GI/Hepatic negative GI ROS, Neg liver ROS,,,  Endo/Other  negative endocrine ROS    Renal/GU negative Renal ROS  negative genitourinary   Musculoskeletal   Abdominal   Peds  Hematology  (+) Blood dyscrasia, anemia   Anesthesia Other Findings Past Medical History: No date: Anemia No date: Arthritis     Comment:  Right hip No date: Cervical radiculopathy No date: Chicken pox No date: Depression No date: Heart palpitations No date: History of colonic polyps No date: Hyperlipidemia No date: Left bundle branch block (LBBB)     Comment:  ekg 02/2019 No date: Motion sickness     Comment:  in past No date: Osteoarthritis of left hip No date: PONV (postoperative nausea and vomiting) No date: Pure hypercholesterolemia No date: UTI (urinary tract infection) No date: Wears hearing aid     Comment:  bilateral Past Surgical History: 2005: BREAST SURGERY     Comment:  Breast Lift 09/17/2015: COLONOSCOPY WITH PROPOFOL; N/A     Comment:  Procedure: COLONOSCOPY WITH PROPOFOL;  Surgeon: Midge Minium, MD;  Location: [facility];  Service:               Endoscopy;  Laterality: N/A; 01/29/2021: COLONOSCOPY WITH PROPOFOL; N/A     Comment:  Procedure:  COLONOSCOPY WITH PROPOFOL;  Surgeon: Midge Minium, MD;  Location: [facility];  Service:               Endoscopy;  Laterality: N/A; 1987: ORIF ULNAR FRACTURE     Comment:  Broken Ulna/plates and screws 09/17/2015: POLYPECTOMY     Comment:  Procedure: POLYPECTOMY INTESTINAL;  Surgeon: Midge Minium, MD;  Location: [facility];  Service:               Endoscopy;;  Sigmoid colon polyp 03/09/2019: TOTAL HIP ARTHROPLASTY; Right     Comment:  Procedure: TOTAL HIP ARTHROPLASTY;  Surgeon: Donato Heinz, MD;  Location: [facility];  Service: Orthopedics;               Laterality: Right; 1995: TUBAL LIGATION   Reproductive/Obstetrics negative OB ROS                             Anesthesia Physical Anesthesia Plan  ASA: 2  Anesthesia Plan: Spinal   Post-op Pain Management:    Induction:   PONV Risk Score and Plan: 4 or greater  Airway Management Planned:  Additional Equipment:   Intra-op Plan:   Post-operative Plan:   Informed Consent: I have reviewed the patients History and Physical, chart, labs and discussed the procedure including the risks, benefits and alternatives for the proposed anesthesia with the patient or authorized representative who has indicated his/her understanding and acceptance.     Dental Advisory Given  Plan Discussed with: CRNA  Anesthesia Plan Comments:        Anesthesia Quick Evaluation

## 2023-07-08 NOTE — Interval H&P Note (Signed)
History and Physical Interval Note:  07/08/2023 6:07 AM  Angelica Newton  has presented today for surgery, with the diagnosis of PRIMARY OSTEOARTHRITIS OF LEFT HIP.Marland Kitchen  The various methods of treatment have been discussed with the patient and family. After consideration of risks, benefits and other options for treatment, the patient has consented to  Procedure(s): TOTAL HIP ARTHROPLASTY (Left) as a surgical intervention.  The patient's history has been reviewed, patient examined, no change in status, stable for surgery.  I have reviewed the patient's chart and labs.  Questions were answered to the patient's satisfaction.     Nichoals Heyde P Lovett Coffin

## 2023-07-08 NOTE — Evaluation (Signed)
Physical Therapy Evaluation Patient Details Name: Angelica Newton MRN: [id number] DOB: 01/08/1951 Today's Date: 07/08/2023  History of Present Illness  Pt is a 73 yo F diagnosed with degenerative arthrosis of the left hip and is s/p elective L THA.  PMH includes R THA, anemia, depression, osteoporosis, and LBBB.   Clinical Impression  Pt was pleasant and motivated to participate during the session and put forth good effort throughout. Pt required no physical assistance during the session but did require frequent cuing for proper sequencing for LLE posterior hip precaution compliance and for general safe use of the RW.  Pt was generally steady with all upright activities with no overt LOB and with no adverse symptoms noted other than mild L hip pain.  Pt's SpO2 and HR were both WNL on room air.  Pt will benefit from continued PT services upon discharge to safely address deficits listed in patient problem list for decreased caregiver assistance and eventual return to PLOF.          If plan is discharge home, recommend the following: A little help with walking and/or transfers;A little help with bathing/dressing/bathroom;Assistance with cooking/housework;Assist for transportation;Help with stairs or ramp for entrance   Can travel by private vehicle        Equipment Recommendations None recommended by PT  Recommendations for Other Services       Functional Status Assessment Patient has had a recent decline in their functional status and demonstrates the ability to make significant improvements in function in a reasonable and predictable amount of time.     Precautions / Restrictions Precautions Precautions: Fall;Posterior Hip Precaution Booklet Issued: Yes (comment) Restrictions Weight Bearing Restrictions Per Provider Order: Yes LLE Weight Bearing Per Provider Order: Weight bearing as tolerated      Mobility  Bed Mobility Overal bed mobility: Modified Independent              General bed mobility comments: Min extra time and effort only    Transfers Overall transfer level: Needs assistance Equipment used: Rolling walker (2 wheels) Transfers: Sit to/from Stand Sit to Stand: Contact guard assist           General transfer comment: Mod multi-modal cues for proper sequencing for hip precaution compliance    Ambulation/Gait Ambulation/Gait assistance: Contact guard assist Gait Distance (Feet): 10 Feet Assistive device: Rolling walker (2 wheels) Gait Pattern/deviations: Step-to pattern, Decreased step length - right, Decreased stance time - left Gait velocity: decreased     General Gait Details: Pt steady with multi-modal cues for sequencing for hip precaution comliance  Stairs            Wheelchair Mobility     Tilt Bed    Modified Rankin (Stroke Patients Only)       Balance Overall balance assessment: Needs assistance   Sitting balance-Leahy Scale: Normal     Standing balance support: Bilateral upper extremity supported, During functional activity Standing balance-Leahy Scale: Good                               Pertinent Vitals/Pain Pain Assessment Pain Assessment: 0-10 Pain Score: 2  Pain Location: L hip Pain Descriptors / Indicators: Sore Pain Intervention(s): Repositioned, Premedicated before session, Ice applied    Home Living Family/patient expects to be discharged to:: Private residence Living Arrangements: Children Available Help at Discharge: Family;Available 24 hours/day;Friend(s) Type of Home: House Home Access: Stairs to enter Entrance Stairs-Rails: None Entrance  Stairs-Number of Steps: 1   Home Layout: One level Home Equipment: Agricultural consultant (2 wheels);BSC/3in1      Prior Function Prior Level of Function : Independent/Modified Independent             Mobility Comments: Ind amb community distances without an AD, no fall history, does Spin Class 3x/wk ADLs Comments: Ind with  ADLs     Extremity/Trunk Assessment   Upper Extremity Assessment Upper Extremity Assessment: Overall WFL for tasks assessed    Lower Extremity Assessment Lower Extremity Assessment: LLE deficits/detail LLE Deficits / Details: BLE ankle strength, AROM, and sensation to light touch grossly WNL LLE Sensation: WNL LLE Coordination: WNL       Communication   Communication Communication: No apparent difficulties Cueing Techniques: Verbal cues;Visual cues;Tactile cues  Cognition Arousal: Alert Behavior During Therapy: WFL for tasks assessed/performed Overall Cognitive Status: Within Functional Limits for tasks assessed                                          General Comments      Exercises Other Exercises: 90 deg L turn training to prevent CKC L hip IR Other Exercises: HEP eduction per handout Other Exercises: Posterior hip precaution education verbally and during functional task training Other Exercises: Positioning education in bed/chair for hip precaution compliance   Assessment/Plan    PT Assessment Patient needs continued PT services  PT Problem List Decreased strength;Decreased activity tolerance;Decreased balance;Decreased mobility;Decreased knowledge of use of DME;Decreased knowledge of precautions;Pain       PT Treatment Interventions DME instruction;Gait training;Stair training;Functional mobility training;Therapeutic activities;Therapeutic exercise;Balance training;Patient/family education    PT Goals (Current goals can be found in the Care Plan section)  Acute Rehab PT Goals Patient Stated Goal: To be able to stand up without moaning PT Goal Formulation: With patient Time For Goal Achievement: 07/21/23 Potential to Achieve Goals: Good    Frequency BID     Co-evaluation               AM-PAC PT "6 Clicks" Mobility  Outcome Measure Help needed turning from your back to your side while in a flat bed without using bedrails?: A  Little Help needed moving from lying on your back to sitting on the side of a flat bed without using bedrails?: A Little Help needed moving to and from a bed to a chair (including a wheelchair)?: A Little Help needed standing up from a chair using your arms (e.g., wheelchair or bedside chair)?: A Little Help needed to walk in hospital room?: A Little Help needed climbing 3-5 steps with a railing? : A Little 6 Click Score: 18    End of Session Equipment Utilized During Treatment: Gait belt Activity Tolerance: Patient tolerated treatment well Patient left: in chair;with call bell/phone within reach;with family/visitor present;with SCD's reapplied Nurse Communication: Mobility status;Weight bearing status PT Visit Diagnosis: Other abnormalities of gait and mobility (R26.89);Muscle weakness (generalized) (M62.81);Pain Pain - Right/Left: Left Pain - part of body: Hip    Time: 1610-9604 PT Time Calculation (min) (ACUTE ONLY): 29 min   Charges:   PT Evaluation $PT Eval Moderate Complexity: 1 Mod PT Treatments $Therapeutic Activity: 8-22 mins PT General Charges $$ ACUTE PT VISIT: 1 Visit       D. Elly Modena PT, DPT 07/08/23, 3:46 PM

## 2023-07-08 NOTE — Anesthesia Procedure Notes (Signed)
Spinal  Patient location during procedure: OR Start time: 07/08/2023 7:22 AM Reason for block: surgical anesthesia Staffing Performed: anesthesiologist  Anesthesiologist: Stephanie Coup, MD Performed by: Edmund Hilda, CRNA Authorized by: Yevette Edwards, MD   Preanesthetic Checklist Completed: patient identified, IV checked, site marked, risks and benefits discussed, surgical consent, monitors and equipment checked, pre-op evaluation and timeout performed Spinal Block Patient position: sitting Prep: DuraPrep Patient monitoring: heart rate, cardiac monitor, continuous pulse ox and blood pressure Approach: midline Location: L3-4 Injection technique: single-shot Needle Needle type: Sprotte  Needle gauge: 24 G Needle length: 9 cm Assessment Sensory level: T4 Events: CSF return

## 2023-07-09 ENCOUNTER — Encounter: Payer: Self-pay | Admitting: Orthopedic Surgery

## 2023-07-09 ENCOUNTER — Other Ambulatory Visit: Payer: Self-pay | Admitting: Family Medicine

## 2023-07-09 DIAGNOSIS — Z96641 Presence of right artificial hip joint: Secondary | ICD-10-CM | POA: Diagnosis not present

## 2023-07-09 DIAGNOSIS — M1612 Unilateral primary osteoarthritis, left hip: Secondary | ICD-10-CM | POA: Diagnosis not present

## 2023-07-09 DIAGNOSIS — Z87891 Personal history of nicotine dependence: Secondary | ICD-10-CM | POA: Diagnosis not present

## 2023-07-09 MED ORDER — EZETIMIBE 10 MG PO TABS
ORAL_TABLET | ORAL | Status: AC
  Filled 2023-07-09: qty 1

## 2023-07-09 MED ORDER — ACETAMINOPHEN 10 MG/ML IV SOLN
INTRAVENOUS | Status: AC
  Filled 2023-07-09: qty 100

## 2023-07-09 MED ORDER — CELECOXIB 200 MG PO CAPS
ORAL_CAPSULE | ORAL | Status: AC
  Filled 2023-07-09: qty 1

## 2023-07-09 MED ORDER — ASPIRIN 81 MG PO CHEW
CHEWABLE_TABLET | ORAL | Status: AC
  Filled 2023-07-09: qty 1

## 2023-07-09 MED ORDER — CELECOXIB 200 MG PO CAPS: 200.0000 mg | ORAL_CAPSULE | Freq: Two times a day (BID) | ORAL | 1 refills | Status: DC

## 2023-07-09 MED ORDER — MAGNESIUM HYDROXIDE 400 MG/5ML PO SUSP
ORAL | Status: AC
  Filled 2023-07-09: qty 30

## 2023-07-09 MED ORDER — SENNOSIDES-DOCUSATE SODIUM 8.6-50 MG PO TABS
ORAL_TABLET | ORAL | Status: AC
  Filled 2023-07-09: qty 1

## 2023-07-09 MED ORDER — PRAVASTATIN SODIUM 20 MG PO TABS
ORAL_TABLET | ORAL | Status: AC
  Filled 2023-07-09: qty 1

## 2023-07-09 MED ORDER — FERROUS SULFATE 325 (65 FE) MG PO TABS
ORAL_TABLET | ORAL | Status: AC
  Filled 2023-07-09: qty 1

## 2023-07-09 MED ORDER — ACETAMINOPHEN 10 MG/ML IV SOLN
INTRAVENOUS | Status: AC
Start: 2023-07-09 — End: ?
  Filled 2023-07-09: qty 100

## 2023-07-09 MED ORDER — METOCLOPRAMIDE HCL 10 MG PO TABS
ORAL_TABLET | ORAL | Status: AC
  Filled 2023-07-09: qty 1

## 2023-07-09 MED ORDER — TRAMADOL HCL 50 MG PO TABS: 50.0000 mg | ORAL_TABLET | ORAL | 0 refills | Status: DC | PRN

## 2023-07-09 MED ORDER — OXYCODONE HCL 5 MG PO TABS: 5.0000 mg | ORAL_TABLET | ORAL | 0 refills | Status: DC | PRN

## 2023-07-09 MED ORDER — ASPIRIN 81 MG PO CHEW: 81.0000 mg | CHEWABLE_TABLET | Freq: Two times a day (BID) | ORAL | Status: DC

## 2023-07-09 MED ORDER — PANTOPRAZOLE SODIUM 40 MG PO TBEC
DELAYED_RELEASE_TABLET | ORAL | Status: AC
  Filled 2023-07-09: qty 1

## 2023-07-09 NOTE — Plan of Care (Signed)
Problem: Activity: Goal: Risk for activity intolerance will decrease Outcome: Progressing   Problem: Nutrition: Goal: Adequate nutrition will be maintained Outcome: Progressing   Problem: Pain Managment: Goal: General experience of comfort will improve and/or be controlled Outcome: Progressing   Problem: Safety: Goal: Ability to remain free from injury will improve Outcome: Progressing

## 2023-07-09 NOTE — Discharge Summary (Signed)
Physician Discharge Summary  Subjective: 1 Day Post-Op Procedure(s) (LRB): TOTAL HIP ARTHROPLASTY (Left) Patient reports pain as mild.   Patient seen in rounds with Dr. Ernest Pine. Patient is well, and has had no acute complaints or problems.  Denies any CP, SOB, N/B, fevers or chills. We will continue with therapy today. Patient is ready to go home  Physician Discharge Summary  Patient ID: Angelica Newton MRN: [id number] DOB/AGE: [birth year] 73 y.o.  Admit date: 07/08/2023 Discharge date: 07/09/2023  Admission Diagnoses:  Discharge Diagnoses:  Principal Problem:   Hx of total hip arthroplasty, left   Discharged Condition: Good  Hospital Course: Patient presented to the hospital on 07/08/2023 for an elective left total hip arthroplasty performed by Dr. Ernest Pine. Patient was given 1g of TXA and 2g of Ancef prior to the procedure.  She tolerated the procedure well without any complications. See procedural note below for details. Postoperatively, the patient did very well.  She was able to pass PT protocols on post-op day one without any issues. JP drain was removed without any difficulty and was intact.  She was able to void her bladder without any difficulty. Physical exam was unremarkable.  She denies any SOB, CP, N/V, fevers or chills. Vital signs are stable. Patient is stable to discharge home.   PROCEDURE:  Left total hip arthroplasty   SURGEON:  Jena Gauss. M.D.   ASSISTANT:  Gean Birchwood, PA-C (present and scrubbed throughout the case, critical for assistance with exposure, retraction, instrumentation, and closure)   ANESTHESIA: spinal   ESTIMATED BLOOD LOSS: 100 mL   FLUIDS REPLACED: 1000 mL of crystalloid   DRAINS: 2 medium Hemovac drains   IMPLANTS UTILIZED: DePuy size 7 standard offset Actis femoral stem, 54 mm OD Pinnacle 100 acetabular component, +4 mm 10 degree Pinnacle Marathon polyethylene insert, and a 36 mm M-SPEC +1.5 mm hip ball  Treatments:  None  Discharge Exam: Blood pressure 122/62, pulse (!) 58, temperature 98.4 F (36.9 C), temperature source Temporal, resp. rate 16, height 5\' 8"  (1.727 m), weight 70.8 kg, SpO2 97%.   Disposition: Home   Allergies as of 07/09/2023       Reactions   Lipitor [atorvastatin]    "feels terrible"   Tetracyclines & Related Other (See Comments)   Just felt lousey   Zocor [simvastatin] Other (See Comments)   Flu type symptoms   Mango Butter Rash   Just MANGOS not MANGO BUTTER Swollen lips        Medication List     TAKE these medications    amoxicillin 500 MG capsule Commonly known as: AMOXIL Take 2,000 mg by mouth once. Prior to dental appointment   aspirin 81 MG chewable tablet Chew 1 tablet (81 mg total) by mouth 2 (two) times daily.   celecoxib 200 MG capsule Commonly known as: CELEBREX Take 1 capsule (200 mg total) by mouth 2 (two) times daily.   cholecalciferol 25 MCG (1000 UNIT) tablet Commonly known as: VITAMIN D3 Take 2,000 Units by mouth daily.   Collagen Hydrolysate (Bovine) Powd Take 1 Scoop by mouth daily.   ezetimibe 10 MG tablet Commonly known as: ZETIA TAKE 1 TABLET(10 MG) BY MOUTH DAILY What changed: See the new instructions.   LUTEIN 20 PO Take 20 mg by mouth daily.   oxyCODONE 5 MG immediate release tablet Commonly known as: Oxy IR/ROXICODONE Take 1 tablet (5 mg total) by mouth every 4 (four) hours as needed for moderate pain (pain score 4-6) (pain score 4-6).  pravastatin 20 MG tablet Commonly known as: PRAVACHOL Take 1 tablet (20 mg total) by mouth daily.   traMADol 50 MG tablet Commonly known as: ULTRAM Take 1-2 tablets (50-100 mg total) by mouth every 4 (four) hours as needed for moderate pain (pain score 4-6).               Durable Medical Equipment  (From admission, onward)           Start     Ordered   07/08/23 1446  DME Walker rolling  Once       Question:  Patient needs a walker to treat with the following  condition  Answer:  S/P total hip arthroplasty   07/08/23 1445   07/08/23 1446  DME Bedside commode  Once       Comments: Patient is not able to walk the distance required to go the bathroom, or he/she is unable to safely negotiate stairs required to access the bathroom.  A 3in1 BSC will alleviate this problem  Question:  Patient needs a bedside commode to treat with the following condition  Answer:  S/P total hip arthroplasty   07/08/23 1445            Follow-up Information     Hooten, Illene Labrador, MD Follow up on 08/20/2023.   Specialty: Orthopedic Surgery Why: at 10:30am Contact information: [address] [facility] [place] [address] [phone]                 Signed: Gean Birchwood 07/09/2023, 8:27 AM   Objective: Vital signs in last 24 hours: Temp:  [97.3 F (36.3 C)-99 F (37.2 C)] 98.4 F (36.9 C) (01/30 0506) Pulse Rate:  [51-71] 58 (01/30 0740) Resp:  [12-20] 16 (01/30 0740) BP: (104-144)/(48-79) 122/62 (01/30 0740) SpO2:  [97 %-100 %] 97 % (01/30 0740) Weight:  [70.8 kg] 70.8 kg (01/29 1130)  Intake/Output from previous day:  Intake/Output Summary (Last 24 hours) at 07/09/2023 0827 Last data filed at 07/08/2023 1751 Gross per 24 hour  Intake 2460 ml  Output 890 ml  Net 1570 ml    Intake/Output this shift: No intake/output data recorded.  Labs: No results for input(s): "HGB" in the last 72 hours. No results for input(s): "WBC", "RBC", "HCT", "PLT" in the last 72 hours. No results for input(s): "NA", "K", "CL", "CO2", "BUN", "CREATININE", "GLUCOSE", "CALCIUM" in the last 72 hours. No results for input(s): "LABPT", "INR" in the last 72 hours.  EXAM: General - Patient is Alert and Appropriate Extremity - Neurologically intact ABD soft Neurovascular intact Sensation intact distally Intact pulses distally Dorsiflexion/Plantar flexion intact No cellulitis present Compartment soft Incision - clean, dry, no drainage Motor  Function -intact, moving foot and toes well on exam. JP drain pulled without difficulty.  Intact  Assessment/Plan: 1 Day Post-Op Procedure(s) (LRB): TOTAL HIP ARTHROPLASTY (Left) Procedure(s) (LRB): TOTAL HIP ARTHROPLASTY (Left) Past Medical History:  Diagnosis Date   Anemia    Arthritis    Right hip   Cervical radiculopathy    Chicken pox    Depression    Heart palpitations    History of colonic polyps    Hyperlipidemia    Left bundle branch block (LBBB)    ekg 02/2019   Motion sickness    in past   Osteoarthritis of left hip    PONV (postoperative nausea and vomiting)    Pure hypercholesterolemia    UTI (urinary tract infection)    Wears hearing aid  bilateral   Principal Problem:   Hx of total hip arthroplasty, left  Estimated body mass index is 23.73 kg/m as calculated from the following:   Height as of this encounter: 5\' 8"  (1.727 m).   Weight as of this encounter: 70.8 kg.  Patient will continue to work with physical therapy to pass postoperative PT protocols, ROM and strengthening   Hip Preacutions   Discussed with the patient continuing to utilize ice over the bandage   Patient will wear TED hose bilaterally to help prevent DVT and clot formation   Discussed the Aquacel bandage.  This bandage will stay in place 7 days postoperatively.  Can be replaced with honeycomb bandages that will be sent home with the patient   Discussed sending the patient home with tramadol and oxycodone for as needed pain management.  Patient will also be sent home with Celebrex to help with swelling and inflammation.  Patient will take an 81 mg aspirin twice daily for DVT prophylaxis   JP drain removed without difficulty, intact   Weight-Bearing as tolerated to left leg   Patient will follow-up with [facility] clinic orthopedics in 6 weeks for re-imaging and reevaluation  Diet - Regular diet Follow up - in 6 weeks Activity - WBAT Disposition - Home Condition Upon Discharge -  Good DVT Prophylaxis - Aspirin and TED hose  Danise Edge, PA-C Orthopaedic Surgery 07/09/2023, 8:27 AM

## 2023-07-09 NOTE — Progress Notes (Signed)
Physical Therapy Treatment Patient Details Name: Angelica Newton MRN: [id number] DOB: 09/22/1950 Today's Date: 07/09/2023   History of Present Illness Pt is a 73 yo F diagnosed with degenerative arthrosis of the left hip and is s/p elective L THA.  PMH includes R THA, anemia, depression, osteoporosis, and LBBB.    PT Comments  Pt was sitting in recliner upon arrival. She is A and O x 4. She endorses no pain and was able to recall 2/3 precautions. Pt was easily and safely able to exit bed, stand, and ambulate with RW. She perform 1 step to simulate home entry without difficulty. Pt is progressing well. She is cleared from an acute standpoint for safe DC home with HHPT to follow. Pt endorses having all DME needs met already.    If plan is discharge home, recommend the following: A little help with walking and/or transfers;A little help with bathing/dressing/bathroom;Assistance with cooking/housework;Assist for transportation;Help with stairs or ramp for entrance     Equipment Recommendations  None recommended by PT       Precautions / Restrictions Precautions Precautions: Fall;Posterior Hip Precaution Booklet Issued: Yes (comment) Precaution Comments: recalled 2/3 precautions Restrictions Weight Bearing Restrictions Per Provider Order: Yes LLE Weight Bearing Per Provider Order: Weight bearing as tolerated     Mobility  Bed Mobility Overal bed mobility: Modified Independent   Transfers Overall transfer level: Modified independent Equipment used: Rolling walker (2 wheels) Transfers: Sit to/from Stand Sit to Stand: Supervision  Ambulation/Gait Ambulation/Gait assistance: Supervision Gait Distance (Feet): 200 Feet Assistive device: Rolling walker (2 wheels) Gait Pattern/deviations: Step-through pattern  General Gait Details: pt demonstrated safe abilities to ambulate > 200 ft with RW without LOB or safety concerns.   Stairs Stairs: Yes Stairs assistance: Supervision,  Contact guard assist Stair Management: No rails, Step to pattern, Backwards, Forwards, With walker Number of Stairs: 1 General stair comments: pt demonstrated safe performance of ascending/descending step to simulate home environment     Balance Overall balance assessment: Modified Independent  Standing balance support: Bilateral upper extremity supported, During functional activity Standing balance-Leahy Scale: Good     Cognition Arousal: Alert Behavior During Therapy: WFL for tasks assessed/performed Overall Cognitive Status: Within Functional Limits for tasks assessed    General Comments: Pt is A and O x 4           General Comments General comments (skin integrity, edema, etc.): pt has all equipment needs met. discussed post acute PT going forward. encouraged routine mobility + performance of HEP/exercises      Pertinent Vitals/Pain Pain Assessment Pain Assessment: No/denies pain Pain Score: 0-No pain Pain Intervention(s): Limited activity within patient's tolerance, Monitored during session, Premedicated before session, Repositioned     PT Goals (current goals can now be found in the care plan section) Acute Rehab PT Goals Patient Stated Goal: go home Progress towards PT goals: Progressing toward goals    Frequency    BID       AM-PAC PT "6 Clicks" Mobility   Outcome Measure  Help needed turning from your back to your side while in a flat bed without using bedrails?: A Little Help needed moving from lying on your back to sitting on the side of a flat bed without using bedrails?: A Little Help needed moving to and from a bed to a chair (including a wheelchair)?: A Little Help needed standing up from a chair using your arms (e.g., wheelchair or bedside chair)?: A Little Help needed to walk in hospital room?:  A Little Help needed climbing 3-5 steps with a railing? : A Little 6 Click Score: 18    End of Session   Activity Tolerance: Patient tolerated  treatment well Patient left: Other (comment) (standing in room with OT) Nurse Communication: Mobility status PT Visit Diagnosis: Other abnormalities of gait and mobility (R26.89);Muscle weakness (generalized) (M62.81);Pain Pain - Right/Left: Left Pain - part of body: Hip     Time: 1610-9604 PT Time Calculation (min) (ACUTE ONLY): 20 min  Charges:    $Gait Training: 8-22 mins PT General Charges $$ ACUTE PT VISIT: 1 Visit                     Jetta Lout PTA 07/09/23, 9:28 AM

## 2023-07-09 NOTE — Care Management Obs Status (Signed)
MEDICARE OBSERVATION STATUS NOTIFICATION   Patient Details  Name: Angelica Newton MRN: [id number] Date of Birth: 1951/03/22   Medicare Observation Status Notification Given:  Yes    Sherilyn Banker 07/09/2023, 11:23 AM

## 2023-07-09 NOTE — Evaluation (Signed)
Occupational Therapy Evaluation Patient Details Name: Angelica Newton MRN: [id number] DOB: 06-13-1950 Today's Date: 07/09/2023   History of Present Illness Pt is a 73 yo F diagnosed with degenerative arthrosis of the left hip and is s/p elective L THA.  PMH includes R THA, anemia, depression, osteoporosis, and LBBB.   Clinical Impression   Ms. Salomone was seen for OT evaluation this date, POD#1 from above surgery. Pt was active and independent in all ADLs/IADLs prior to surgery, she reports attending spin classes regularly. Pt is eager to return to PLOF with less pain and improved safety and independence. Pt currently MOD I for LB dressing while in seated position due to pain and limited AROM of L hip. Pt able to recall 3/3 posterior total hip precautions at start of session and able  to verbalize how to implement during ADL and mobility with good recall noted from prior R THA. Reinforcement of prior education on posterior total hip precautions and how to implement, self care skills, falls prevention strategies, home/routines modifications, DME/AE for LB bathing and dressing tasks, compression stocking mgt strategies, and car transfer techniques. PT return verbalizes understanding of all education provided. No further skilled OT needs identified. Will sign off at this time. Do not anticipate the need for follow up OT services upon acute hospital DC.         If plan is discharge home, recommend the following: A little help with walking and/or transfers;A little help with bathing/dressing/bathroom;Help with stairs or ramp for entrance;Assist for transportation    Functional Status Assessment     Equipment Recommendations  None recommended by OT (PT has all necessary equipment)    Recommendations for Other Services       Precautions / Restrictions Precautions Precautions: Fall;Posterior Hip Precaution Booklet Issued: Yes (comment) Precaution Comments: recalled 2/3  precautions Restrictions Weight Bearing Restrictions Per Provider Order: Yes LLE Weight Bearing Per Provider Order: Weight bearing as tolerated      Mobility Bed Mobility               General bed mobility comments: Working with PT upon arrival and in recliner at end of session.    Transfers Overall transfer level: Modified independent Equipment used: Rolling walker (2 wheels) Transfers: Sit to/from Stand Sit to Stand: Modified independent (Device/Increase time)                  Balance Overall balance assessment: Modified Independent   Sitting balance-Leahy Scale: Normal     Standing balance support: Bilateral upper extremity supported, During functional activity Standing balance-Leahy Scale: Good                             ADL either performed or assessed with clinical judgement   ADL Overall ADL's : Needs assistance/impaired                                       General ADL Comments: Pt performs simulated LB dressing task from STS with RW no physical assist from therapist. Discussed modifications for ADL management in setting of posterior THPs. Pt has LH reacher and shoe horn at home. Voices plan for family to assist with compression stocking management upon DC. Good recall/carryover of education provided from previous R THA. Able to recall 3/3 posterior hip precautions and implement them into ADLs during session. MOD I for  functional mobility.     Vision Baseline Vision/History: 1 Wears glasses Ability to See in Adequate Light: 1 Impaired Patient Visual Report: No change from baseline       Perception         Praxis         Pertinent Vitals/Pain Pain Assessment Pain Assessment: Faces Faces Pain Scale: Hurts a little bit Pain Location: L hip Pain Descriptors / Indicators: Sore Pain Intervention(s): Limited activity within patient's tolerance, Monitored during session     Extremity/Trunk Assessment Upper Extremity  Assessment Upper Extremity Assessment: Overall WFL for tasks assessed   Lower Extremity Assessment Lower Extremity Assessment: LLE deficits/detail;Defer to PT evaluation LLE Deficits / Details: s/p L THA posterior approach       Communication Communication Communication: No apparent difficulties Cueing Techniques: Verbal cues   Cognition Arousal: Alert Behavior During Therapy: WFL for tasks assessed/performed Overall Cognitive Status: Within Functional Limits for tasks assessed                                 General Comments: Pt is pleasant A and O x 4     General Comments  pt has all equipment needs met. discussed post acute PT going forward. encouraged routine mobility + performance of HEP/exercises    Exercises Other Exercises Other Exercises: Pt educated on posterior THPs, ADL management and compensatory strategies, falls prevention for home and hospital, and DC recs.   Shoulder Instructions      Home Living Family/patient expects to be discharged to:: Private residence Living Arrangements: Children Available Help at Discharge: Family;Available 24 hours/day;Friend(s) Type of Home: House Home Access: Stairs to enter [employer] of Steps: 1 Entrance Stairs-Rails: None Home Layout: One level     Bathroom Shower/Tub: Producer, television/film/video: Standard     Home Equipment: Agricultural consultant (2 wheels);BSC/3in1          Prior Functioning/Environment Prior Level of Function : Independent/Modified Independent             Mobility Comments: Ind amb community distances without an AD, no fall history, does Spin Class 3x/wk ADLs Comments: Ind with ADLs/IADLs        OT Problem List: Decreased strength;Decreased range of motion;Decreased activity tolerance      OT Treatment/Interventions:      OT Goals(Current goals can be found in the care plan section) Acute Rehab OT Goals Patient Stated Goal: To go home OT Goal Formulation:  All assessment and education complete, DC therapy Time For Goal Achievement: 07/09/23 Potential to Achieve Goals: Good  OT Frequency:      Co-evaluation              AM-PAC OT "6 Clicks" Daily Activity     Outcome Measure Help from another person eating meals?: None Help from another person taking care of personal grooming?: None Help from another person toileting, which includes using toliet, bedpan, or urinal?: None Help from another person bathing (including washing, rinsing, drying)?: A Little Help from another person to put on and taking off regular upper body clothing?: None Help from another person to put on and taking off regular lower body clothing?: A Little 6 Click Score: 22   End of Session Equipment Utilized During Treatment: Gait belt;Rolling walker (2 wheels)  Activity Tolerance: Patient tolerated treatment well Patient left: in chair;with call bell/phone within reach  OT Visit Diagnosis: Other abnormalities of gait and mobility (  R26.89);Pain Pain - Right/Left: Left Pain - part of body: Hip                Time: 2952-8413 OT Time Calculation (min): 20 min Charges:  OT General Charges $OT Visit: 1 Visit OT Evaluation $OT Eval Low Complexity: 1 Low  Rockney Ghee, M.S., OTR/L 07/09/23, 9:35 AM

## 2023-07-09 NOTE — Progress Notes (Signed)
DISCHARGE NOTE:  Pt given discharge instructions, scripts and 2 honeycomb dressings. Pt verbalized understanidngTED hose on both legs. Pt wheeled to car by staff, daughter providing transportation home.

## 2023-07-09 NOTE — Progress Notes (Signed)
1/30 Patient had to give verbal consent due to tablet malfunction. The signing feature would not work.

## 2023-07-09 NOTE — Plan of Care (Signed)
  Problem: Clinical Measurements: Goal: Respiratory complications will improve Outcome: Progressing   Problem: Activity: Goal: Risk for activity intolerance will decrease Outcome: Progressing   Problem: Coping: Goal: Level of anxiety will decrease Outcome: Progressing   Problem: Elimination: Goal: Will not experience complications related to urinary retention Outcome: Progressing   Problem: Pain Managment: Goal: General experience of comfort will improve and/or be controlled Outcome: Progressing   Problem: Activity: Goal: Ability to avoid complications of mobility impairment will improve Outcome: Progressing

## 2023-07-09 NOTE — Progress Notes (Signed)
Subjective: 1 Day Post-Op Procedure(s) (LRB): TOTAL HIP ARTHROPLASTY (Left) Patient reports pain as mild.   Patient seen in rounds with Dr. Ernest Pine. Patient is well, and has had no acute complaints or problems.  Denies any CP, SOB, N/B, fevers or chills. We will continue with therapy today. Plan is to go Home after hospital stay.  Objective: Vital signs in last 24 hours: Temp:  [97.3 F (36.3 C)-99 F (37.2 C)] 98.4 F (36.9 C) (01/30 0506) Pulse Rate:  [51-71] 58 (01/30 0740) Resp:  [12-20] 16 (01/30 0740) BP: (104-144)/(48-79) 122/62 (01/30 0740) SpO2:  [97 %-100 %] 97 % (01/30 0740) Weight:  [70.8 kg] 70.8 kg (01/29 1130)  Intake/Output from previous day:  Intake/Output Summary (Last 24 hours) at 07/09/2023 0822 Last data filed at 07/08/2023 1751 Gross per 24 hour  Intake 2460 ml  Output 890 ml  Net 1570 ml    Intake/Output this shift: No intake/output data recorded.  Labs: No results for input(s): "HGB" in the last 72 hours. No results for input(s): "WBC", "RBC", "HCT", "PLT" in the last 72 hours. No results for input(s): "NA", "K", "CL", "CO2", "BUN", "CREATININE", "GLUCOSE", "CALCIUM" in the last 72 hours. No results for input(s): "LABPT", "INR" in the last 72 hours.  EXAM General - Patient is Alert, Appropriate, and Oriented Extremity - Neurologically intact ABD soft Neurovascular intact Sensation intact distally Intact pulses distally Dorsiflexion/Plantar flexion intact No cellulitis present Compartment soft Dressing - dressing C/D/I and no drainage Motor Function - intact, moving foot and toes well on exam. JP Drain pulled without difficulty. Intact    Past Medical History:  Diagnosis Date   Anemia    Arthritis    Right hip   Cervical radiculopathy    Chicken pox    Depression    Heart palpitations    History of colonic polyps    Hyperlipidemia    Left bundle branch block (LBBB)    ekg 02/2019   Motion sickness    in past   Osteoarthritis of  left hip    PONV (postoperative nausea and vomiting)    Pure hypercholesterolemia    UTI (urinary tract infection)    Wears hearing aid    bilateral    Assessment/Plan: 1 Day Post-Op Procedure(s) (LRB): TOTAL HIP ARTHROPLASTY (Left) Principal Problem:   Hx of total hip arthroplasty, left  Estimated body mass index is 23.73 kg/m as calculated from the following:   Height as of this encounter: 5\' 8"  (1.727 m).   Weight as of this encounter: 70.8 kg. Advance diet Up with therapy  Patient will continue to work with physical therapy to pass postoperative PT protocols, ROM and strengthening  Hip Preacutions  Discussed with the patient continuing to utilize ice over the bandage  Patient will wear TED hose bilaterally to help prevent DVT and clot formation  Discussed the Aquacel bandage.  This bandage will stay in place 7 days postoperatively.  Can be replaced with honeycomb bandages that will be sent home with the patient  Discussed sending the patient home with tramadol and oxycodone for as needed pain management.  Patient will also be sent home with Celebrex to help with swelling and inflammation.  Patient will take an 81 mg aspirin twice daily for DVT prophylaxis  JP drain removed without difficulty, intact  Weight-Bearing as tolerated to left leg  Patient will follow-up with [facility] clinic orthopedics in 6 weeks for re-imaging and reevaluation   Rayburn Go, PA-C [facility] 07/09/2023, 8:22 AM

## 2023-07-10 DIAGNOSIS — Z471 Aftercare following joint replacement surgery: Secondary | ICD-10-CM | POA: Diagnosis not present

## 2023-07-10 DIAGNOSIS — Z96643 Presence of artificial hip joint, bilateral: Secondary | ICD-10-CM | POA: Diagnosis not present

## 2023-07-10 DIAGNOSIS — Z87891 Personal history of nicotine dependence: Secondary | ICD-10-CM | POA: Diagnosis not present

## 2023-07-10 DIAGNOSIS — I447 Left bundle-branch block, unspecified: Secondary | ICD-10-CM | POA: Diagnosis not present

## 2023-07-10 DIAGNOSIS — D649 Anemia, unspecified: Secondary | ICD-10-CM | POA: Diagnosis not present

## 2023-07-10 DIAGNOSIS — E7800 Pure hypercholesterolemia, unspecified: Secondary | ICD-10-CM | POA: Diagnosis not present

## 2023-07-10 DIAGNOSIS — Z791 Long term (current) use of non-steroidal anti-inflammatories (NSAID): Secondary | ICD-10-CM | POA: Diagnosis not present

## 2023-07-10 DIAGNOSIS — M810 Age-related osteoporosis without current pathological fracture: Secondary | ICD-10-CM | POA: Diagnosis not present

## 2023-07-10 DIAGNOSIS — F32A Depression, unspecified: Secondary | ICD-10-CM | POA: Diagnosis not present

## 2023-07-10 DIAGNOSIS — Z7982 Long term (current) use of aspirin: Secondary | ICD-10-CM | POA: Diagnosis not present

## 2023-07-10 DIAGNOSIS — M5412 Radiculopathy, cervical region: Secondary | ICD-10-CM | POA: Diagnosis not present

## 2023-07-10 DIAGNOSIS — Z860100 Personal history of colon polyps, unspecified: Secondary | ICD-10-CM | POA: Diagnosis not present

## 2023-07-10 DIAGNOSIS — Z87440 Personal history of urinary (tract) infections: Secondary | ICD-10-CM | POA: Diagnosis not present

## 2023-07-15 DIAGNOSIS — Z471 Aftercare following joint replacement surgery: Secondary | ICD-10-CM | POA: Diagnosis not present

## 2023-07-15 DIAGNOSIS — M810 Age-related osteoporosis without current pathological fracture: Secondary | ICD-10-CM | POA: Diagnosis not present

## 2023-07-15 DIAGNOSIS — I447 Left bundle-branch block, unspecified: Secondary | ICD-10-CM | POA: Diagnosis not present

## 2023-07-15 DIAGNOSIS — E7800 Pure hypercholesterolemia, unspecified: Secondary | ICD-10-CM | POA: Diagnosis not present

## 2023-07-15 DIAGNOSIS — F32A Depression, unspecified: Secondary | ICD-10-CM | POA: Diagnosis not present

## 2023-07-15 DIAGNOSIS — M5412 Radiculopathy, cervical region: Secondary | ICD-10-CM | POA: Diagnosis not present

## 2023-07-17 DIAGNOSIS — Z471 Aftercare following joint replacement surgery: Secondary | ICD-10-CM | POA: Diagnosis not present

## 2023-07-17 DIAGNOSIS — M5412 Radiculopathy, cervical region: Secondary | ICD-10-CM | POA: Diagnosis not present

## 2023-07-17 DIAGNOSIS — I447 Left bundle-branch block, unspecified: Secondary | ICD-10-CM | POA: Diagnosis not present

## 2023-07-17 DIAGNOSIS — M810 Age-related osteoporosis without current pathological fracture: Secondary | ICD-10-CM | POA: Diagnosis not present

## 2023-07-17 DIAGNOSIS — E7800 Pure hypercholesterolemia, unspecified: Secondary | ICD-10-CM | POA: Diagnosis not present

## 2023-07-17 DIAGNOSIS — F32A Depression, unspecified: Secondary | ICD-10-CM | POA: Diagnosis not present

## 2023-07-22 DIAGNOSIS — Z471 Aftercare following joint replacement surgery: Secondary | ICD-10-CM | POA: Diagnosis not present

## 2023-07-22 DIAGNOSIS — E7800 Pure hypercholesterolemia, unspecified: Secondary | ICD-10-CM | POA: Diagnosis not present

## 2023-07-22 DIAGNOSIS — M5412 Radiculopathy, cervical region: Secondary | ICD-10-CM | POA: Diagnosis not present

## 2023-07-22 DIAGNOSIS — M810 Age-related osteoporosis without current pathological fracture: Secondary | ICD-10-CM | POA: Diagnosis not present

## 2023-07-22 DIAGNOSIS — I447 Left bundle-branch block, unspecified: Secondary | ICD-10-CM | POA: Diagnosis not present

## 2023-07-22 DIAGNOSIS — F32A Depression, unspecified: Secondary | ICD-10-CM | POA: Diagnosis not present

## 2023-07-23 DIAGNOSIS — M5412 Radiculopathy, cervical region: Secondary | ICD-10-CM | POA: Diagnosis not present

## 2023-07-23 DIAGNOSIS — F32A Depression, unspecified: Secondary | ICD-10-CM | POA: Diagnosis not present

## 2023-07-23 DIAGNOSIS — E7800 Pure hypercholesterolemia, unspecified: Secondary | ICD-10-CM | POA: Diagnosis not present

## 2023-07-23 DIAGNOSIS — Z471 Aftercare following joint replacement surgery: Secondary | ICD-10-CM | POA: Diagnosis not present

## 2023-07-23 DIAGNOSIS — I447 Left bundle-branch block, unspecified: Secondary | ICD-10-CM | POA: Diagnosis not present

## 2023-07-23 DIAGNOSIS — M810 Age-related osteoporosis without current pathological fracture: Secondary | ICD-10-CM | POA: Diagnosis not present

## 2023-07-24 DIAGNOSIS — M810 Age-related osteoporosis without current pathological fracture: Secondary | ICD-10-CM | POA: Diagnosis not present

## 2023-07-24 DIAGNOSIS — E7800 Pure hypercholesterolemia, unspecified: Secondary | ICD-10-CM | POA: Diagnosis not present

## 2023-07-24 DIAGNOSIS — D649 Anemia, unspecified: Secondary | ICD-10-CM | POA: Diagnosis not present

## 2023-07-24 DIAGNOSIS — F32A Depression, unspecified: Secondary | ICD-10-CM | POA: Diagnosis not present

## 2023-07-24 DIAGNOSIS — Z96643 Presence of artificial hip joint, bilateral: Secondary | ICD-10-CM | POA: Diagnosis not present

## 2023-07-24 DIAGNOSIS — I447 Left bundle-branch block, unspecified: Secondary | ICD-10-CM | POA: Diagnosis not present

## 2023-07-24 DIAGNOSIS — M5412 Radiculopathy, cervical region: Secondary | ICD-10-CM | POA: Diagnosis not present

## 2023-07-24 DIAGNOSIS — Z471 Aftercare following joint replacement surgery: Secondary | ICD-10-CM | POA: Diagnosis not present

## 2023-07-28 DIAGNOSIS — E7800 Pure hypercholesterolemia, unspecified: Secondary | ICD-10-CM | POA: Diagnosis not present

## 2023-07-28 DIAGNOSIS — I447 Left bundle-branch block, unspecified: Secondary | ICD-10-CM | POA: Diagnosis not present

## 2023-07-28 DIAGNOSIS — M810 Age-related osteoporosis without current pathological fracture: Secondary | ICD-10-CM | POA: Diagnosis not present

## 2023-07-28 DIAGNOSIS — F32A Depression, unspecified: Secondary | ICD-10-CM | POA: Diagnosis not present

## 2023-07-28 DIAGNOSIS — Z471 Aftercare following joint replacement surgery: Secondary | ICD-10-CM | POA: Diagnosis not present

## 2023-07-28 DIAGNOSIS — M5412 Radiculopathy, cervical region: Secondary | ICD-10-CM | POA: Diagnosis not present

## 2023-08-04 DIAGNOSIS — I447 Left bundle-branch block, unspecified: Secondary | ICD-10-CM | POA: Diagnosis not present

## 2023-08-04 DIAGNOSIS — M810 Age-related osteoporosis without current pathological fracture: Secondary | ICD-10-CM | POA: Diagnosis not present

## 2023-08-04 DIAGNOSIS — E7800 Pure hypercholesterolemia, unspecified: Secondary | ICD-10-CM | POA: Diagnosis not present

## 2023-08-04 DIAGNOSIS — F32A Depression, unspecified: Secondary | ICD-10-CM | POA: Diagnosis not present

## 2023-08-04 DIAGNOSIS — M5412 Radiculopathy, cervical region: Secondary | ICD-10-CM | POA: Diagnosis not present

## 2023-08-04 DIAGNOSIS — Z471 Aftercare following joint replacement surgery: Secondary | ICD-10-CM | POA: Diagnosis not present

## 2023-08-09 DIAGNOSIS — Z87891 Personal history of nicotine dependence: Secondary | ICD-10-CM | POA: Diagnosis not present

## 2023-08-09 DIAGNOSIS — M810 Age-related osteoporosis without current pathological fracture: Secondary | ICD-10-CM | POA: Diagnosis not present

## 2023-08-09 DIAGNOSIS — D649 Anemia, unspecified: Secondary | ICD-10-CM | POA: Diagnosis not present

## 2023-08-09 DIAGNOSIS — Z96643 Presence of artificial hip joint, bilateral: Secondary | ICD-10-CM | POA: Diagnosis not present

## 2023-08-09 DIAGNOSIS — Z471 Aftercare following joint replacement surgery: Secondary | ICD-10-CM | POA: Diagnosis not present

## 2023-08-09 DIAGNOSIS — Z87440 Personal history of urinary (tract) infections: Secondary | ICD-10-CM | POA: Diagnosis not present

## 2023-08-09 DIAGNOSIS — M5412 Radiculopathy, cervical region: Secondary | ICD-10-CM | POA: Diagnosis not present

## 2023-08-09 DIAGNOSIS — Z860100 Personal history of colon polyps, unspecified: Secondary | ICD-10-CM | POA: Diagnosis not present

## 2023-08-09 DIAGNOSIS — E7800 Pure hypercholesterolemia, unspecified: Secondary | ICD-10-CM | POA: Diagnosis not present

## 2023-08-09 DIAGNOSIS — Z7982 Long term (current) use of aspirin: Secondary | ICD-10-CM | POA: Diagnosis not present

## 2023-08-09 DIAGNOSIS — I447 Left bundle-branch block, unspecified: Secondary | ICD-10-CM | POA: Diagnosis not present

## 2023-08-09 DIAGNOSIS — Z791 Long term (current) use of non-steroidal anti-inflammatories (NSAID): Secondary | ICD-10-CM | POA: Diagnosis not present

## 2023-08-09 DIAGNOSIS — F32A Depression, unspecified: Secondary | ICD-10-CM | POA: Diagnosis not present

## 2023-08-11 DIAGNOSIS — Z471 Aftercare following joint replacement surgery: Secondary | ICD-10-CM | POA: Diagnosis not present

## 2023-08-11 DIAGNOSIS — M810 Age-related osteoporosis without current pathological fracture: Secondary | ICD-10-CM | POA: Diagnosis not present

## 2023-08-11 DIAGNOSIS — E7800 Pure hypercholesterolemia, unspecified: Secondary | ICD-10-CM | POA: Diagnosis not present

## 2023-08-11 DIAGNOSIS — I447 Left bundle-branch block, unspecified: Secondary | ICD-10-CM | POA: Diagnosis not present

## 2023-08-11 DIAGNOSIS — F32A Depression, unspecified: Secondary | ICD-10-CM | POA: Diagnosis not present

## 2023-08-11 DIAGNOSIS — M5412 Radiculopathy, cervical region: Secondary | ICD-10-CM | POA: Diagnosis not present

## 2023-08-18 DIAGNOSIS — I447 Left bundle-branch block, unspecified: Secondary | ICD-10-CM | POA: Diagnosis not present

## 2023-08-18 DIAGNOSIS — M5412 Radiculopathy, cervical region: Secondary | ICD-10-CM | POA: Diagnosis not present

## 2023-08-18 DIAGNOSIS — Z471 Aftercare following joint replacement surgery: Secondary | ICD-10-CM | POA: Diagnosis not present

## 2023-08-18 DIAGNOSIS — M810 Age-related osteoporosis without current pathological fracture: Secondary | ICD-10-CM | POA: Diagnosis not present

## 2023-08-18 DIAGNOSIS — F32A Depression, unspecified: Secondary | ICD-10-CM | POA: Diagnosis not present

## 2023-08-18 DIAGNOSIS — E7800 Pure hypercholesterolemia, unspecified: Secondary | ICD-10-CM | POA: Diagnosis not present

## 2023-08-20 DIAGNOSIS — Z96642 Presence of left artificial hip joint: Secondary | ICD-10-CM | POA: Diagnosis not present

## 2023-08-26 ENCOUNTER — Encounter: Payer: Medicare Other | Admitting: Family Medicine

## 2023-09-01 ENCOUNTER — Ambulatory Visit: Payer: Medicare Other | Admitting: Family Medicine

## 2023-10-07 ENCOUNTER — Telehealth: Payer: Self-pay | Admitting: Family Medicine

## 2023-10-07 ENCOUNTER — Encounter

## 2023-10-07 DIAGNOSIS — E785 Hyperlipidemia, unspecified: Secondary | ICD-10-CM

## 2023-10-07 NOTE — Telephone Encounter (Signed)
 Prescription Request  10/07/2023 TOC With in Dr Casimir Cleaver in Jan.  LOV: Visit date not found  What is the name of the medication or equipment?  pravastatin  (PRAVACHOL ) 20 MG tablet   Have you contacted your pharmacy to request a refill? Yes   Which pharmacy would you like this sent to?  [facility] #[id number] [place], [place] - [address] AT [facility] [address] [place] [address] Phone: [phone] Fax: [phone]    Patient notified that their request is being sent to the clinical staff for review and that they should receive a response within 2 business days.   Please advise at [facility] [phone]

## 2023-10-08 ENCOUNTER — Other Ambulatory Visit: Payer: Self-pay

## 2023-10-08 DIAGNOSIS — E785 Hyperlipidemia, unspecified: Secondary | ICD-10-CM

## 2023-10-08 MED ORDER — PRAVASTATIN SODIUM 20 MG PO TABS: 20.0000 mg | ORAL_TABLET | Freq: Every day | ORAL | 0 refills | Status: DC

## 2023-10-08 NOTE — Telephone Encounter (Signed)
 Sent to provider

## 2023-10-16 ENCOUNTER — Encounter: Payer: Self-pay | Admitting: Nurse Practitioner

## 2023-10-16 ENCOUNTER — Ambulatory Visit: Admitting: Nurse Practitioner

## 2023-10-16 VITALS — BP 116/76 | HR 54 | Temp 97.9°F | Ht 68.0 in | Wt 154.6 lb

## 2023-10-16 DIAGNOSIS — D649 Anemia, unspecified: Secondary | ICD-10-CM

## 2023-10-16 DIAGNOSIS — E785 Hyperlipidemia, unspecified: Secondary | ICD-10-CM

## 2023-10-16 DIAGNOSIS — E559 Vitamin D deficiency, unspecified: Secondary | ICD-10-CM | POA: Diagnosis not present

## 2023-10-16 NOTE — Progress Notes (Unsigned)
 Established Patient Office Visit  Subjective:  Patient ID: Angelica Newton, female    DOB: 11-29-1950  Age: 73 y.o. MRN: [id number]  CC: No chief complaint on file.   HPI  Angelica Newton presents for:  HPI   Past Medical History:  Diagnosis Date   Anemia    Arthritis    Right hip   Cervical radiculopathy    Chicken pox    Depression    Heart palpitations    History of colonic polyps    Hyperlipidemia    Left bundle branch block (LBBB)    ekg 02/2019   Motion sickness    in past   Osteoarthritis of left hip    PONV (postoperative nausea and vomiting)    Pure hypercholesterolemia    UTI (urinary tract infection)    Wears hearing aid    bilateral    Past Surgical History:  Procedure Laterality Date   BREAST SURGERY  2005   Breast Lift   COLONOSCOPY WITH PROPOFOL  N/A 09/17/2015   Procedure: COLONOSCOPY WITH PROPOFOL ;  Surgeon: Angelica Sink, MD;  Location: [facility];  Service: Endoscopy;  Laterality: N/A;   COLONOSCOPY WITH PROPOFOL  N/A 01/29/2021   Procedure: COLONOSCOPY WITH PROPOFOL ;  Surgeon: Angelica Sink, MD;  Location: [facility];  Service: Endoscopy;  Laterality: N/A;   ORIF ULNAR FRACTURE  1987   Broken Ulna/plates and screws   POLYPECTOMY  09/17/2015   Procedure: POLYPECTOMY INTESTINAL;  Surgeon: Angelica Sink, MD;  Location: [facility];  Service: Endoscopy;;  Sigmoid colon polyp   TOTAL HIP ARTHROPLASTY Right 03/09/2019   Procedure: TOTAL HIP ARTHROPLASTY;  Surgeon: Angelica Lame, MD;  Location: [facility];  Service: Orthopedics;  Laterality: Right;   TOTAL HIP ARTHROPLASTY Left 07/08/2023   Procedure: TOTAL HIP ARTHROPLASTY;  Surgeon: Angelica Lame, MD;  Location: [facility];  Service: Orthopedics;  Laterality: Left;   TUBAL LIGATION  1995    Family History  Problem Relation Age of Onset   Hyperlipidemia Mother    Stroke Mother    Diabetes Mother    Hyperlipidemia Father    Stroke Father    Hypertension Father     Kidney failure Father    Diabetes Father    Diabetes Maternal Grandmother    Breast cancer Paternal Grandmother    Cancer Paternal Grandmother        Breast   Diabetes Brother    Cancer Brother        squamous cell carcinoma    Social History   Socioeconomic History   Marital status: Single    Spouse name: Not on file   Number of children: 2   Years of education: Not on file   Highest education level: Associate degree: academic program  Occupational History   Not on file  Tobacco Use   Smoking status: Former    Current packs/day: 0.00    Types: Cigarettes    Start date: 06/09/2005    Quit date: 06/09/2010    Years since quitting: 13.3   Smokeless tobacco: Never   Tobacco comments:    only smoked 1 cig per day  Vaping Use   Vaping status: Never Used  Substance and Sexual Activity   Alcohol use: Yes    Alcohol/week: 10.0 standard drinks of alcohol    Types: 10 Glasses of wine per week    Comment: occ   Drug use: No   Sexual activity: Yes    Partners: Male  Other Topics Concern  Not on file  Social History Narrative   Retired, but volunteers    Lives by herself   Children- 2 (42 and 74 yo)    Pets: None    Caffeine: 2 cups in the morning, diet coke rarely    Right handed    Enjoys bike riding, cooking, traveling    Social Drivers of Corporate investment banker Strain: Low Risk  (10/06/2023)   Overall Financial Resource Strain (CARDIA)    Difficulty of Paying Living Expenses: Not hard at all  Food Insecurity: No Food Insecurity (10/06/2023)   Hunger Vital Sign    Worried About Running Out of Food in the Last Year: Never true    Ran Out of Food in the Last Year: Never true  Transportation Needs: No Transportation Needs (10/06/2023)   PRAPARE - Administrator, Civil Service (Medical): No    Lack of Transportation (Non-Medical): No  Physical Activity: Sufficiently Active (10/06/2023)   Exercise Vital Sign    Days of Exercise per Week: 4 days    Minutes  of Exercise per Session: 60 min  Stress: No Stress Concern Present (10/06/2023)   [employer] of Occupational Health - Occupational Stress Questionnaire    Feeling of Stress : Not at all  Social Connections: Moderately Integrated (10/06/2023)   Social Connection and Isolation Panel [NHANES]    Frequency of Communication with Friends and Family: More than three times a week    Frequency of Social Gatherings with Friends and Family: More than three times a week    Attends Religious Services: 1 to 4 times per year    Active Member of [employer] or Organizations: No    Attends Engineer, structural: More than 4 times per year    Marital Status: Divorced  Catering manager Violence: Not At Risk (07/08/2023)   Humiliation, Afraid, Rape, and Kick questionnaire    Fear of Current or Ex-Partner: No    Emotionally Abused: No    Physically Abused: No    Sexually Abused: No     Outpatient Medications Prior to Visit  Medication Sig Dispense Refill   amoxicillin (AMOXIL) 500 MG capsule Take 2,000 mg by mouth once. Prior to dental appointment     aspirin  81 MG chewable tablet Chew 1 tablet (81 mg total) by mouth 2 (two) times daily.     celecoxib  (CELEBREX ) 200 MG capsule Take 1 capsule (200 mg total) by mouth 2 (two) times daily. 60 capsule 1   cholecalciferol  (VITAMIN D3) 25 MCG (1000 UNIT) tablet Take 2,000 Units by mouth daily.     Collagen Hydrolysate, Bovine, POWD Take 1 Scoop by mouth daily.     ezetimibe  (ZETIA ) 10 MG tablet TAKE 1 TABLET(10 MG) BY MOUTH DAILY 90 tablet 0   Misc Natural Products (LUTEIN 20 PO) Take 20 mg by mouth daily.     oxyCODONE  (OXY IR/ROXICODONE ) 5 MG immediate release tablet Take 1 tablet (5 mg total) by mouth every 4 (four) hours as needed for moderate pain (pain score 4-6) (pain score 4-6). 30 tablet 0   pravastatin  (PRAVACHOL ) 20 MG tablet Take 1 tablet (20 mg total) by mouth daily. 90 tablet 0   traMADol  (ULTRAM ) 50 MG tablet Take 1-2 tablets (50-100 mg  total) by mouth every 4 (four) hours as needed for moderate pain (pain score 4-6). 30 tablet 0   No facility-administered medications prior to visit.    Allergies  Allergen Reactions   Lipitor [Atorvastatin]     "  feels terrible"   Tetracyclines & Related Other (See Comments)    Just felt lousey   Zocor [Simvastatin] Other (See Comments)    Flu type symptoms   Mango Butter Rash    Just MANGOS not MANGO BUTTER Swollen lips    ROS Review of Systems Negative unless indicated in HPI.    Objective:     Physical Exam  There were no vitals taken for this visit. Wt Readings from Last 3 Encounters:  07/08/23 156 lb 1.4 oz (70.8 kg)  07/02/23 156 lb (70.8 kg)  01/30/23 147 lb (66.7 kg)     Health Maintenance  Topic Date Due   DTaP/Tdap/Td (1 - Tdap) Never done   COVID-19 Vaccine (6 - 2024-25 season) 10/31/2023 (Originally 02/08/2023)   INFLUENZA VACCINE  01/08/2024   Medicare Annual Wellness (AWV)  01/30/2024   MAMMOGRAM  12/11/2024   Colonoscopy  01/30/2028   Pneumonia Vaccine 28+ Years old  Completed   DEXA SCAN  Completed   Hepatitis C Screening  Completed   Zoster Vaccines- Shingrix  Completed   HPV VACCINES  Aged Out   Meningococcal B Vaccine  Aged Out    There are no preventive care reminders to display for this patient.  Lab Results  Component Value Date   TSH 3.13 08/19/2022   Lab Results  Component Value Date   WBC 5.4 07/02/2023   HGB 12.6 07/02/2023   HCT 36.3 07/02/2023   MCV 93.3 07/02/2023   PLT 243 07/02/2023   Lab Results  Component Value Date   NA 135 07/02/2023   K 4.1 07/02/2023   CO2 23 07/02/2023   GLUCOSE 92 07/02/2023   BUN 19 07/02/2023   CREATININE 0.79 07/02/2023   BILITOT 0.9 07/02/2023   ALKPHOS 49 07/02/2023   AST 27 07/02/2023   ALT 23 07/02/2023   PROT 7.3 07/02/2023   ALBUMIN 3.9 07/02/2023   CALCIUM  9.1 07/02/2023   ANIONGAP 10 07/02/2023   GFR 79.38 08/19/2022   Lab Results  Component Value Date   CHOL 214 (H)  08/16/2021   Lab Results  Component Value Date   HDL 69.00 08/16/2021   Lab Results  Component Value Date   LDLCALC 128 (H) 08/16/2021   Lab Results  Component Value Date   TRIG 83.0 08/16/2021   Lab Results  Component Value Date   CHOLHDL 3 08/16/2021   Lab Results  Component Value Date   HGBA1C 5.6 11/01/2014      Assessment & Plan:  There are no diagnoses linked to this encounter.  Follow-up: No follow-ups on file.   Terree Gaultney, NP

## 2023-10-17 DIAGNOSIS — E559 Vitamin D deficiency, unspecified: Secondary | ICD-10-CM | POA: Insufficient documentation

## 2023-10-17 LAB — CBC WITH DIFFERENTIAL/PLATELET
Basophils Absolute: 0.1 10*3/uL (ref 0.0–0.2)
Basos: 2 %
EOS (ABSOLUTE): 0 10*3/uL (ref 0.0–0.4)
Eos: 1 %
Hematocrit: 40.1 % (ref 34.0–46.6)
Hemoglobin: 12.9 g/dL (ref 11.1–15.9)
Immature Grans (Abs): 0 10*3/uL (ref 0.0–0.1)
Immature Granulocytes: 0 %
Lymphocytes Absolute: 2.2 10*3/uL (ref 0.7–3.1)
Lymphs: 36 %
MCH: 29.7 pg (ref 26.6–33.0)
MCHC: 32.2 g/dL (ref 31.5–35.7)
MCV: 92 fL (ref 79–97)
Monocytes Absolute: 0.4 10*3/uL (ref 0.1–0.9)
Monocytes: 6 %
Neutrophils Absolute: 3.5 10*3/uL (ref 1.4–7.0)
Neutrophils: 55 %
Platelets: 258 10*3/uL (ref 150–450)
RBC: 4.35 x10E6/uL (ref 3.77–5.28)
RDW: 13.1 % (ref 11.7–15.4)
WBC: 6.3 10*3/uL (ref 3.4–10.8)

## 2023-10-17 LAB — COMPREHENSIVE METABOLIC PANEL WITH GFR
ALT: 23 IU/L (ref 0–32)
AST: 26 IU/L (ref 0–40)
Albumin: 4.2 g/dL (ref 3.8–4.8)
Alkaline Phosphatase: 69 IU/L (ref 44–121)
BUN/Creatinine Ratio: 15 (ref 12–28)
BUN: 13 mg/dL (ref 8–27)
Bilirubin Total: 0.8 mg/dL (ref 0.0–1.2)
CO2: 21 mmol/L (ref 20–29)
Calcium: 9.7 mg/dL (ref 8.7–10.3)
Chloride: 102 mmol/L (ref 96–106)
Creatinine, Ser: 0.88 mg/dL (ref 0.57–1.00)
Globulin, Total: 2.8 g/dL (ref 1.5–4.5)
Glucose: 89 mg/dL (ref 70–99)
Potassium: 4.2 mmol/L (ref 3.5–5.2)
Sodium: 138 mmol/L (ref 134–144)
Total Protein: 7 g/dL (ref 6.0–8.5)
eGFR: 70 mL/min/{1.73_m2} (ref >59)

## 2023-10-17 LAB — LIPID PANEL
Chol/HDL Ratio: 2.8 ratio (ref 0.0–4.4)
Cholesterol, Total: 207 mg/dL — ABNORMAL HIGH (ref 100–199)
HDL: 74 mg/dL (ref >39)
LDL Chol Calc (NIH): 113 mg/dL — ABNORMAL HIGH (ref 0–99)
Triglycerides: 115 mg/dL (ref 0–149)
VLDL Cholesterol Cal: 20 mg/dL (ref 5–40)

## 2023-10-17 LAB — TSH: TSH: 2.28 u[IU]/mL (ref 0.450–4.500)

## 2023-10-17 LAB — VITAMIN D 25 HYDROXY (VIT D DEFICIENCY, FRACTURES): Vit D, 25-Hydroxy: 40.7 ng/mL (ref 30.0–100.0)

## 2023-10-17 NOTE — Assessment & Plan Note (Signed)
 Managed with pravastatin  and Zetia . No side effects.  -Will check lipid panel.

## 2023-10-17 NOTE — Assessment & Plan Note (Signed)
 Continue Vit D 2000IU daily. -Will check Vit D level.

## 2023-10-18 ENCOUNTER — Other Ambulatory Visit: Payer: Self-pay | Admitting: Nurse Practitioner

## 2023-10-18 ENCOUNTER — Encounter: Payer: Self-pay | Admitting: Nurse Practitioner

## 2023-10-18 DIAGNOSIS — E785 Hyperlipidemia, unspecified: Secondary | ICD-10-CM

## 2023-12-01 DIAGNOSIS — D2262 Melanocytic nevi of left upper limb, including shoulder: Secondary | ICD-10-CM | POA: Diagnosis not present

## 2023-12-01 DIAGNOSIS — D2272 Melanocytic nevi of left lower limb, including hip: Secondary | ICD-10-CM | POA: Diagnosis not present

## 2023-12-01 DIAGNOSIS — L821 Other seborrheic keratosis: Secondary | ICD-10-CM | POA: Diagnosis not present

## 2023-12-01 DIAGNOSIS — D225 Melanocytic nevi of trunk: Secondary | ICD-10-CM | POA: Diagnosis not present

## 2023-12-01 DIAGNOSIS — D2271 Melanocytic nevi of right lower limb, including hip: Secondary | ICD-10-CM | POA: Diagnosis not present

## 2023-12-01 DIAGNOSIS — D2261 Melanocytic nevi of right upper limb, including shoulder: Secondary | ICD-10-CM | POA: Diagnosis not present

## 2023-12-03 ENCOUNTER — Other Ambulatory Visit: Payer: Self-pay | Admitting: Nurse Practitioner

## 2023-12-03 DIAGNOSIS — Z1231 Encounter for screening mammogram for malignant neoplasm of breast: Secondary | ICD-10-CM

## 2023-12-09 ENCOUNTER — Other Ambulatory Visit: Payer: Self-pay | Admitting: Family Medicine

## 2023-12-09 DIAGNOSIS — E785 Hyperlipidemia, unspecified: Secondary | ICD-10-CM

## 2023-12-09 NOTE — Telephone Encounter (Unsigned)
 Copied from CRM [id number]. Topic: Clinical - Medication Refill >> Dec 09, 2023 11:50 AM Geroldine GRADE wrote: Medication: ezetimibe  (ZETIA ) 10 MG tablet pravastatin  (PRAVACHOL ) 20 MG tablet   Has the patient contacted their pharmacy? Yes (Agent: If no, request that the patient contact the pharmacy for the refill. If patient does not wish to contact the pharmacy document the reason why and proceed with request.) (Agent: If yes, when and what did the pharmacy advise?)  This is the patient's preferred pharmacy:  [facility] DR[facility] #[id number] [place], [place] - [address] AT [facility] [address] [place] [address] Phone: [phone] Fax: [phone]  Is this the correct pharmacy for this prescription? Yes If no, delete pharmacy and type the correct one.   Has the prescription been filled recently? No  Is the patient out of the medication? No  Has the patient been seen for an appointment in the last year OR does the patient have an upcoming appointment? Yes  Can we respond through MyChart? Yes  Agent: Please be advised that Rx refills may take up to 3 business days. We ask that you follow-up with your pharmacy.

## 2023-12-10 MED ORDER — PRAVASTATIN SODIUM 20 MG PO TABS: 20.0000 mg | ORAL_TABLET | Freq: Every day | ORAL | 3 refills | Status: DC

## 2023-12-10 MED ORDER — EZETIMIBE 10 MG PO TABS: ORAL_TABLET | ORAL | 3 refills | Status: DC

## 2023-12-21 DIAGNOSIS — L089 Local infection of the skin and subcutaneous tissue, unspecified: Secondary | ICD-10-CM | POA: Diagnosis not present

## 2023-12-21 DIAGNOSIS — L237 Allergic contact dermatitis due to plants, except food: Secondary | ICD-10-CM | POA: Diagnosis not present

## 2023-12-21 DIAGNOSIS — S51831A Puncture wound without foreign body of right forearm, initial encounter: Secondary | ICD-10-CM | POA: Diagnosis not present

## 2023-12-23 ENCOUNTER — Ambulatory Visit
Admission: RE | Admit: 2023-12-23 | Discharge: 2023-12-23 | Disposition: A | Discharge status: Home / Self Care | Source: Ambulatory Visit | Attending: Nurse Practitioner | Admitting: Nurse Practitioner

## 2023-12-23 DIAGNOSIS — Z8249 Family history of ischemic heart disease and other diseases of the circulatory system: Secondary | ICD-10-CM | POA: Diagnosis not present

## 2023-12-23 DIAGNOSIS — T466X5A Adverse effect of antihyperlipidemic and antiarteriosclerotic drugs, initial encounter: Secondary | ICD-10-CM | POA: Diagnosis not present

## 2023-12-23 DIAGNOSIS — G720 Drug-induced myopathy: Secondary | ICD-10-CM | POA: Diagnosis not present

## 2023-12-23 DIAGNOSIS — Z1231 Encounter for screening mammogram for malignant neoplasm of breast: Secondary | ICD-10-CM | POA: Diagnosis not present

## 2023-12-23 DIAGNOSIS — I479 Paroxysmal tachycardia, unspecified: Secondary | ICD-10-CM | POA: Diagnosis not present

## 2023-12-23 DIAGNOSIS — R001 Bradycardia, unspecified: Secondary | ICD-10-CM | POA: Diagnosis not present

## 2023-12-23 DIAGNOSIS — I447 Left bundle-branch block, unspecified: Secondary | ICD-10-CM | POA: Diagnosis not present

## 2023-12-23 DIAGNOSIS — R002 Palpitations: Secondary | ICD-10-CM | POA: Diagnosis not present

## 2023-12-23 DIAGNOSIS — E7800 Pure hypercholesterolemia, unspecified: Secondary | ICD-10-CM | POA: Diagnosis not present

## 2023-12-23 DIAGNOSIS — R600 Localized edema: Secondary | ICD-10-CM | POA: Diagnosis not present

## 2023-12-24 ENCOUNTER — Other Ambulatory Visit: Payer: Self-pay | Admitting: Nurse Practitioner

## 2023-12-24 DIAGNOSIS — Z8249 Family history of ischemic heart disease and other diseases of the circulatory system: Secondary | ICD-10-CM

## 2023-12-24 DIAGNOSIS — E7800 Pure hypercholesterolemia, unspecified: Secondary | ICD-10-CM

## 2023-12-24 DIAGNOSIS — I447 Left bundle-branch block, unspecified: Secondary | ICD-10-CM

## 2023-12-30 ENCOUNTER — Ambulatory Visit
Admission: RE | Admit: 2023-12-30 | Discharge: 2023-12-30 | Disposition: A | Discharge status: Home / Self Care | Payer: Self-pay | Source: Ambulatory Visit | Attending: Nurse Practitioner | Admitting: Nurse Practitioner

## 2023-12-30 DIAGNOSIS — I447 Left bundle-branch block, unspecified: Secondary | ICD-10-CM | POA: Insufficient documentation

## 2023-12-30 DIAGNOSIS — E7800 Pure hypercholesterolemia, unspecified: Secondary | ICD-10-CM | POA: Insufficient documentation

## 2023-12-30 DIAGNOSIS — Z8249 Family history of ischemic heart disease and other diseases of the circulatory system: Secondary | ICD-10-CM | POA: Insufficient documentation

## 2024-01-06 DIAGNOSIS — R002 Palpitations: Secondary | ICD-10-CM | POA: Diagnosis not present

## 2024-01-06 DIAGNOSIS — I447 Left bundle-branch block, unspecified: Secondary | ICD-10-CM | POA: Diagnosis not present

## 2024-01-18 DIAGNOSIS — R002 Palpitations: Secondary | ICD-10-CM | POA: Diagnosis not present

## 2024-02-01 DIAGNOSIS — H2513 Age-related nuclear cataract, bilateral: Secondary | ICD-10-CM | POA: Diagnosis not present

## 2024-02-01 DIAGNOSIS — H353131 Nonexudative age-related macular degeneration, bilateral, early dry stage: Secondary | ICD-10-CM | POA: Diagnosis not present

## 2024-02-02 DIAGNOSIS — I4891 Unspecified atrial fibrillation: Secondary | ICD-10-CM | POA: Diagnosis not present

## 2024-02-02 DIAGNOSIS — I2510 Atherosclerotic heart disease of native coronary artery without angina pectoris: Secondary | ICD-10-CM | POA: Diagnosis not present

## 2024-02-02 DIAGNOSIS — Z789 Other specified health status: Secondary | ICD-10-CM | POA: Diagnosis not present

## 2024-02-02 DIAGNOSIS — R001 Bradycardia, unspecified: Secondary | ICD-10-CM | POA: Diagnosis not present

## 2024-02-02 DIAGNOSIS — R002 Palpitations: Secondary | ICD-10-CM | POA: Diagnosis not present

## 2024-02-02 DIAGNOSIS — I447 Left bundle-branch block, unspecified: Secondary | ICD-10-CM | POA: Diagnosis not present

## 2024-02-02 DIAGNOSIS — E7800 Pure hypercholesterolemia, unspecified: Secondary | ICD-10-CM | POA: Diagnosis not present

## 2024-02-03 ENCOUNTER — Ambulatory Visit: Payer: Medicare Other | Admitting: *Deleted

## 2024-02-03 VITALS — Ht 68.0 in | Wt 155.0 lb

## 2024-02-03 DIAGNOSIS — Z0000 Encounter for general adult medical examination without abnormal findings: Secondary | ICD-10-CM | POA: Diagnosis not present

## 2024-02-03 NOTE — Progress Notes (Signed)
 Subjective:   Angelica Newton is a 73 y.o. who presents for a Medicare Wellness preventive visit.  As a reminder, Annual Wellness Visits don't include a physical exam, and some assessments may be limited, especially if this visit is performed virtually. We may recommend an in-person follow-up visit with your provider if needed.  Visit Complete: Virtual I connected with  Angelica Newton on 02/03/24 by a audio enabled telemedicine application and verified that I am speaking with the correct person using two identifiers.  Patient Location: Home  Provider Location: Home Office  I discussed the limitations of evaluation and management by telemedicine. The patient expressed understanding and agreed to proceed.  Vital Signs: Because this visit was a virtual/telehealth visit, some criteria may be missing or patient reported. Any vitals not documented were not able to be obtained and vitals that have been documented are patient reported.  VideoDeclined- This patient declined Librarian, academic. Therefore the visit was completed with audio only.  Persons Participating in Visit: Patient.  AWV Questionnaire: No: Patient Medicare AWV questionnaire was not completed prior to this visit.  Cardiac Risk Factors include: advanced age (>63men, >34 women);dyslipidemia     Objective:    Today's Vitals   02/03/24 1013  Weight: 155 lb (70.3 kg)  Height: 5' 8 (1.727 m)   Body mass index is 23.57 kg/m.     02/03/2024   10:26 AM 07/08/2023    6:21 AM 07/02/2023    9:23 AM 01/30/2023   10:38 AM 01/29/2022    1:19 PM 01/29/2021    8:18 AM 01/21/2021    3:42 PM  Advanced Directives  Does Patient Have a Medical Advance Directive? Yes Yes Yes Yes Yes No Yes  Type of Estate agent of [place];Living will Healthcare Power of [place];Living will Healthcare Power of [place];Living will Healthcare Power of [place];Living will Healthcare Power of  [place];Living will  Healthcare Power of [place];Living will  Does patient want to make changes to medical advance directive? No - Patient declined No - Patient declined  No - Patient declined No - Patient declined  No - Patient declined  Copy of Healthcare Power of Attorney in Chart? Yes - validated most recent copy scanned in chart (See row information) Yes - validated most recent copy scanned in chart (See row information) Yes - validated most recent copy scanned in chart (See row information) Yes - validated most recent copy scanned in chart (See row information) Yes - validated most recent copy scanned in chart (See row information)  Yes - validated most recent copy scanned in chart (See row information)  Would patient like information on creating a medical advance directive?      No - Patient declined     Current Medications (verified) Outpatient Encounter Medications as of 02/03/2024  Medication Sig   amoxicillin (AMOXIL) 500 MG capsule Take 2,000 mg by mouth once. Prior to dental appointment   cholecalciferol  (VITAMIN D3) 25 MCG (1000 UNIT) tablet Take 2,000 Units by mouth daily.   Collagen Hydrolysate, Bovine, POWD Take 1 Scoop by mouth daily.   ezetimibe  (ZETIA ) 10 MG tablet TAKE 1 TABLET(10 MG) BY MOUTH DAILY   Misc Natural Products (LUTEIN 20 PO) Take 20 mg by mouth daily.   pravastatin  (PRAVACHOL ) 20 MG tablet Take 1 tablet (20 mg total) by mouth daily.   No facility-administered encounter medications on file as of 02/03/2024.    Allergies (verified) Lipitor [atorvastatin], Tetracyclines & related, Zocor [simvastatin], and Mango  butter   History: Past Medical History:  Diagnosis Date   Anemia    Arthritis    Right hip   Cervical radiculopathy    Chicken pox    Depression    Heart palpitations    History of colonic polyps    Hyperlipidemia    Left bundle branch block (LBBB)    ekg 02/2019   Motion sickness    in past   Osteoarthritis of left hip    PONV  (postoperative nausea and vomiting)    Pure hypercholesterolemia    UTI (urinary tract infection)    Wears hearing aid    bilateral   Past Surgical History:  Procedure Laterality Date   BREAST SURGERY  2005   Breast Lift   COLONOSCOPY WITH PROPOFOL  N/A 09/17/2015   Procedure: COLONOSCOPY WITH PROPOFOL ;  Surgeon: Angelica Copping, MD;  Location: [facility];  Service: Endoscopy;  Laterality: N/A;   COLONOSCOPY WITH PROPOFOL  N/A 01/29/2021   Procedure: COLONOSCOPY WITH PROPOFOL ;  Surgeon: Newton Rogelia, MD;  Location: [facility];  Service: Endoscopy;  Laterality: N/A;   ORIF ULNAR FRACTURE  1987   Broken Ulna/plates and screws   POLYPECTOMY  09/17/2015   Procedure: POLYPECTOMY INTESTINAL;  Surgeon: Angelica Copping, MD;  Location: [facility];  Service: Endoscopy;;  Sigmoid colon polyp   TOTAL HIP ARTHROPLASTY Right 03/09/2019   Procedure: TOTAL HIP ARTHROPLASTY;  Surgeon: Angelica Lynwood SQUIBB, MD;  Location: [facility] ORS;  Service: Orthopedics;  Laterality: Right;   TOTAL HIP ARTHROPLASTY Left 07/08/2023   Procedure: TOTAL HIP ARTHROPLASTY;  Surgeon: Angelica Lynwood SQUIBB, MD;  Location: [facility] OR[facility];  Service: Orthopedics;  Laterality: Left;   TUBAL LIGATION  1995   Family History  Problem Relation Age of Onset   Hyperlipidemia Mother    Stroke Mother    Diabetes Mother    Hyperlipidemia Father    Stroke Father    Hypertension Father    Kidney failure Father    Diabetes Father    Diabetes Maternal Grandmother    Breast cancer Paternal Grandmother    Cancer Paternal Grandmother        Breast   Diabetes Brother    Cancer Brother        squamous cell carcinoma   Social History   Socioeconomic History   Marital status: Single    Spouse name: Not on file   Number of children: 2   Years of education: Not on file   Highest education level: Associate degree: academic program  Occupational History   Not on file  Tobacco Use   Smoking status: Former    Current packs/day: 0.00     Types: Cigarettes    Start date: 06/09/2005    Quit date: 06/09/2010    Years since quitting: 13.6   Smokeless tobacco: Never   Tobacco comments:    only smoked 1 cig per day  Vaping Use   Vaping status: Never Used  Substance and Sexual Activity   Alcohol use: Yes    Alcohol/week: 10.0 standard drinks of alcohol    Types: 10 Glasses of wine per week    Comment: occ   Drug use: No   Sexual activity: Yes    Partners: Male  Other Topics Concern   Not on file  Social History Narrative   Retired, but volunteers    Lives by herself   Children- 2 (42 and 42 yo)    Pets: None    Caffeine: 2 cups in the morning, diet  coke rarely    Right handed    Enjoys bike riding, cooking, traveling    Social Drivers of Corporate investment banker Strain: Low Risk  (02/03/2024)   Overall Financial Resource Strain (CARDIA)    Difficulty of Paying Living Expenses: Not hard at all  Food Insecurity: No Food Insecurity (02/03/2024)   Hunger Vital Sign    Worried About Running Out of Food in the Last Year: Never true    Ran Out of Food in the Last Year: Never true  Transportation Needs: No Transportation Needs (02/03/2024)   PRAPARE - Administrator, Civil Service (Medical): No    Lack of Transportation (Non-Medical): No  Physical Activity: Sufficiently Active (02/03/2024)   Exercise Vital Sign    Days of Exercise per Week: 7 days    Minutes of Exercise per Session: 60 min  Stress: No Stress Concern Present (02/03/2024)   [employer] of Occupational Health - Occupational Stress Questionnaire    Feeling of Stress: Not at all  Social Connections: Moderately Isolated (02/03/2024)   Social Connection and Isolation Panel    Frequency of Communication with Friends and Family: More than three times a week    Frequency of Social Gatherings with Friends and Family: More than three times a week    Attends Religious Services: Never    Database administrator or Organizations: Yes    Attends Probation officer: More than 4 times per year    Marital Status: Divorced    Tobacco Counseling Counseling given: Not Answered Tobacco comments: only smoked 1 cig per day    Clinical Intake:  Pre-visit preparation completed: Yes  Pain : No/denies pain     BMI - recorded: 23.57 Nutritional Status: BMI of 19-24  Normal Nutritional Risks: None Diabetes: No  Lab Results  Component Value Date   HGBA1C 5.6 11/01/2014     How often do you need to have someone help you when you read instructions, pamphlets, or other written materials from your doctor or pharmacy?: 1 - Never  Interpreter Needed?: No  Information entered by :: R. Derk Doubek LPN   Activities of Daily Living     02/03/2024   10:15 AM 07/08/2023   11:23 AM  In your present state of health, do you have any difficulty performing the following activities:  Hearing? 1 0  Vision? 0 0  Difficulty concentrating or making decisions? 0 0  Walking or climbing stairs? 0   Dressing or bathing? 0   Doing errands, shopping? 0 0  Preparing Food and eating ? N   Using the Toilet? N   In the past six months, have you accidently leaked urine? N   Do you have problems with loss of bowel control? N   Managing your Medications? N   Managing your Finances? N   Housekeeping or managing your Housekeeping? N     Patient Care Team: Vincente Saber, NP as PCP - General (Nurse Practitioner) Ammon Blunt, MD as Consulting Physician (Cardiology) Angelica, Lynwood SQUIBB, MD as Consulting Physician (Orthopedic Surgery)  I have updated your Care Teams any recent Medical Services you may have received from other providers in the past year.     Assessment:   This is a routine wellness examination for [place].  Hearing/Vision screen Hearing Screening - Comments:: Wears aids Vision Screening - Comments:: glasses   Goals Addressed             This Visit's Progress  Patient Stated       Wants to join an exercise  group       Depression Screen     02/03/2024   10:21 AM 10/16/2023    2:27 PM 01/30/2023   10:34 AM 08/27/2022    9:07 AM 07/18/2022   10:06 AM 04/07/2022    2:50 PM 01/29/2022    1:24 PM  PHQ 2/9 Scores  PHQ - 2 Score 0 0 0 0 1 0 0  PHQ- 9 Score 0 0 0 0       Fall Risk     02/03/2024   10:17 AM 10/16/2023    2:27 PM 01/28/2023   12:07 PM 08/27/2022    9:07 AM 07/18/2022   10:06 AM  Fall Risk   Falls in the past year? 0 1 1 0 0  Number falls in past yr: 0 0 0 0 0  Injury with Fall? 0 1 1 0 0  Risk for fall due to : No Fall Risks History of fall(s) History of fall(s);Impaired balance/gait No Fall Risks No Fall Risks  Follow up Falls evaluation completed;Falls prevention discussed Falls evaluation completed Falls evaluation completed;Falls prevention discussed Falls evaluation completed Falls evaluation completed    MEDICARE RISK AT HOME:  Medicare Risk at Home Any stairs in or around the home?: No If so, are there any without handrails?: No Home free of loose throw rugs in walkways, pet beds, electrical cords, etc?: Yes Adequate lighting in your home to reduce risk of falls?: Yes Life alert?: Yes Use of a cane, walker or w/c?: No Grab bars in the bathroom?: No Shower chair or bench in shower?: No Elevated toilet seat or a handicapped toilet?: Yes  TIMED UP AND GO:  Was the test performed?  No  Cognitive Function: 6CIT completed        02/03/2024   10:26 AM 01/30/2023   10:38 AM 01/29/2022    1:26 PM 12/26/2019    9:51 AM 12/24/2018    9:55 AM  6CIT Screen  What Year? 0 points 0 points  0 points 0 points  What month? 0 points 0 points  0 points 0 points  What time? 0 points 0 points  0 points 0 points  Count back from 20 0 points 0 points   0 points  Months in reverse 0 points 0 points 0 points  0 points  Repeat phrase 0 points 0 points   0 points  Total Score 0 points 0 points   0 points    Immunizations Immunization History  Administered Date(s) Administered    Fluad Quad(high Dose 65+) 04/07/2022   INFLUENZA, HIGH DOSE SEASONAL PF 05/10/2018   Influenza Split 07/03/2014, 04/17/2020   Influenza,inj,Quad PF,6+ Mos 05/10/2015   PFIZER Comirnaty(Gray Top)Covid-19 Tri-Sucrose Vaccine 07/27/2019, 08/17/2019   PFIZER(Purple Top)SARS-COV-2 Vaccination 07/27/2019, 08/17/2019, 04/17/2020   Pneumococcal Conjugate-13 05/08/2017   Pneumococcal Polysaccharide-23 11/14/2020   Zoster Recombinant(Shingrix) 02/25/2021, 07/15/2021   Zoster, Live 07/21/2013    Screening Tests Health Maintenance  Topic Date Due   DTaP/Tdap/Td (1 - Tdap) Never done   COVID-19 Vaccine (6 - 2024-25 season) 02/08/2023   Medicare Annual Wellness (AWV)  01/30/2024   INFLUENZA VACCINE  01/08/2024   MAMMOGRAM  12/22/2025   Colonoscopy  01/30/2028   Pneumococcal Vaccine: 50+ Years  Completed   DEXA SCAN  Completed   Hepatitis C Screening  Completed   Zoster Vaccines- Shingrix  Completed   HPV VACCINES  Aged Out   Meningococcal B  Vaccine  Aged Out    Health Maintenance  Health Maintenance Due  Topic Date Due   DTaP/Tdap/Td (1 - Tdap) Never done   COVID-19 Vaccine (6 - 2024-25 season) 02/08/2023   Medicare Annual Wellness (AWV)  01/30/2024   INFLUENZA VACCINE  01/08/2024   Health Maintenance Items Addressed: Discussed the need to update flu and tetanus vaccines.  Patient declines covid vaccine  Additional Screening:  Vision Screening: Recommended annual ophthalmology exams for early detection of glaucoma and other disorders of the eye. Up to date [employer] Would you like a referral to an eye doctor? No    Dental Screening: Recommended annual dental exams for proper oral hygiene  Community Resource Referral / Chronic Care Management: CRR required this visit?  No   CCM required this visit?  No   Plan:    I have personally reviewed and noted the following in the patient's chart:   Medical and social history Use of alcohol, tobacco or illicit drugs  Current  medications and supplements including opioid prescriptions. Patient is not currently taking opioid prescriptions. Functional ability and status Nutritional status Physical activity Advanced directives List of other physicians Hospitalizations, surgeries, and ER visits in previous 12 months Vitals Screenings to include cognitive, depression, and falls Referrals and appointments  In addition, I have reviewed and discussed with patient certain preventive protocols, quality metrics, and best practice recommendations. A written personalized care plan for preventive services as well as general preventive health recommendations were provided to patient.   Angeline Fredericks, LPN   [id number]   After Visit Summary: (MyChart) Due to this being a telephonic visit, the after visit summary with patients personalized plan was offered to patient via MyChart   Notes: Nothing significant to report at this time.

## 2024-02-03 NOTE — Patient Instructions (Signed)
 Angelica Newton , Thank you for taking time out of your busy schedule to complete your Annual Wellness Visit with me. I enjoyed our conversation and look forward to speaking with you again next year. I, as well as your care team,  appreciate your ongoing commitment to your health goals. Please review the following plan we discussed and let me know if I can assist you in the future. Your Game plan/ To Do List    Referrals: If you haven't heard from the office you've been referred to, please reach out to them at the phone provided.  Remember to update your tetanus and flu vaccines.  Follow up Visits: We will see or speak with you next year for your Next Medicare AWV with our clinical staff 02/06/25 @ 10:10 Have you seen your provider in the last 6 months (3 months if uncontrolled diabetes)? Yes  Clinician Recommendations:  Aim for 30 minutes of exercise or brisk walking, 6-8 glasses of water, and 5 servings of fruits and vegetables each day.       This is a list of the screenings recommended for you:  Health Maintenance  Topic Date Due   DTaP/Tdap/Td vaccine (1 - Tdap) Never done   COVID-19 Vaccine (6 - 2024-25 season) 02/08/2023   Flu Shot  01/08/2024   Medicare Annual Wellness Visit  02/02/2025   Mammogram  12/22/2025   Colon Cancer Screening  01/30/2028   Pneumococcal Vaccine for age over 34  Completed   DEXA scan (bone density measurement)  Completed   Hepatitis C Screening  Completed   Zoster (Shingles) Vaccine  Completed   HPV Vaccine  Aged Out   Meningitis B Vaccine  Aged Out    Advanced directives: (In Chart) A copy of your advanced directives are scanned into your chart should your provider ever need it. Advance Care Planning is important because it:  [x]  Makes sure you receive the medical care that is consistent with your values, goals, and preferences  [x]  It provides guidance to your family and loved ones and reduces their decisional burden about whether or not they are making  the right decisions based on your wishes.

## 2024-02-09 DIAGNOSIS — I2510 Atherosclerotic heart disease of native coronary artery without angina pectoris: Secondary | ICD-10-CM | POA: Diagnosis not present

## 2024-02-09 DIAGNOSIS — I4891 Unspecified atrial fibrillation: Secondary | ICD-10-CM | POA: Diagnosis not present

## 2024-02-09 DIAGNOSIS — I447 Left bundle-branch block, unspecified: Secondary | ICD-10-CM | POA: Diagnosis not present

## 2024-02-11 IMAGING — MG MM DIGITAL SCREENING BILAT W/ TOMO AND CAD
8 series · 9 of 24 positions shown · non-contrast
Comparison: Previous exam(s).

CLINICAL DATA: Screening.

EXAM:
DIGITAL SCREENING BILATERAL MAMMOGRAM WITH TOMOSYNTHESIS AND CAD
TECHNIQUE: Bilateral screening digital craniocaudal and mediolateral oblique
mammograms were obtained. Bilateral screening digital breast
tomosynthesis was performed. The images were evaluated with
computer-aided detection.

[L CC synth-2D]
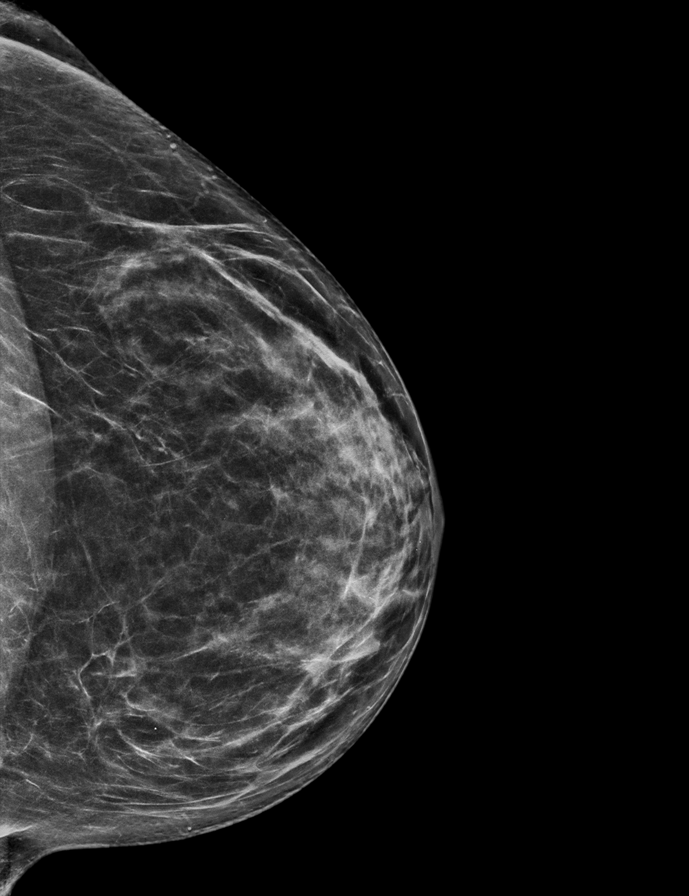

[R MLO synth-2D]
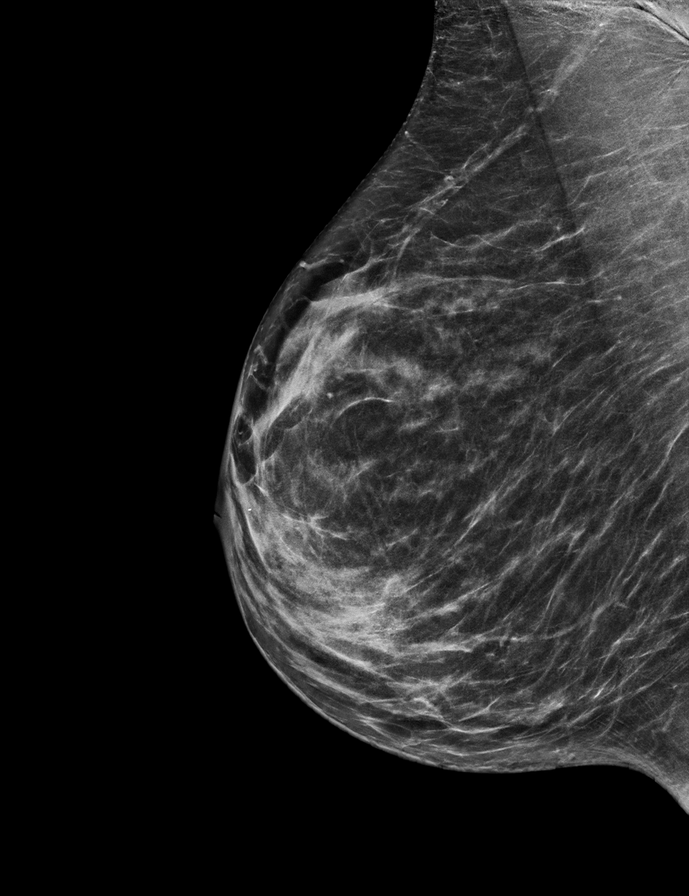

[L MLO synth-2D]
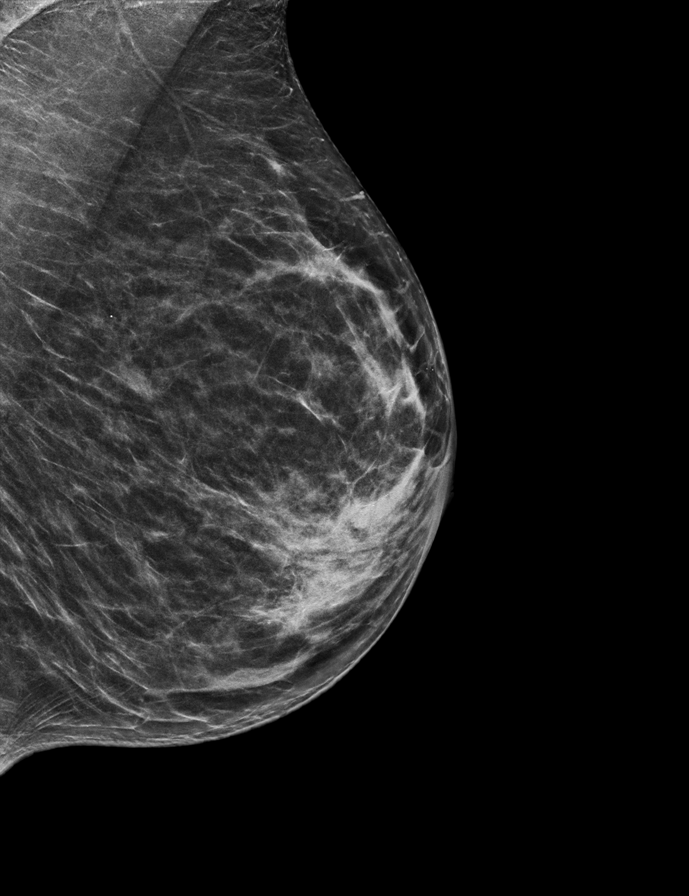

[R CC synth-2D]
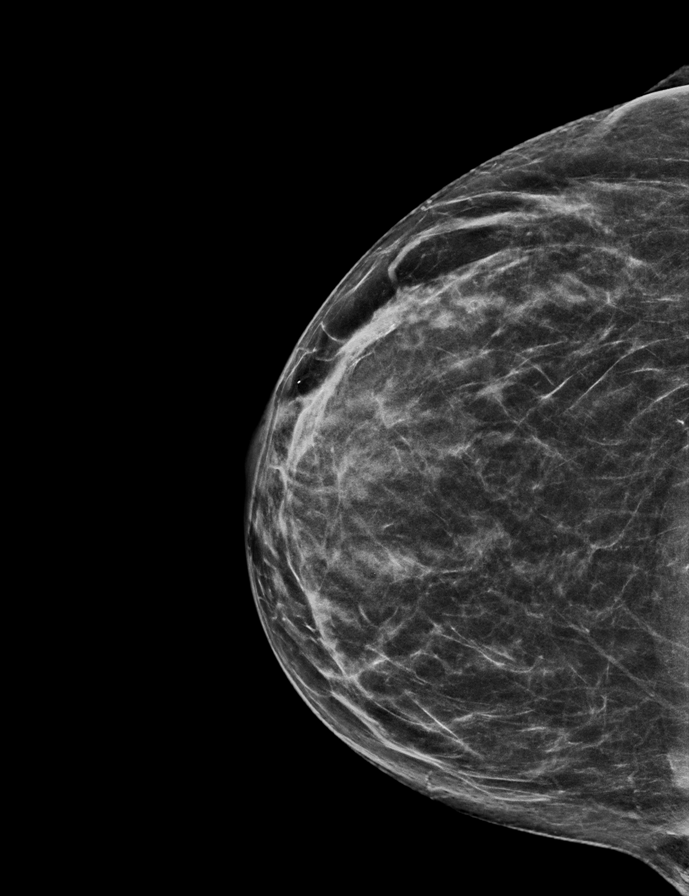

[R MLO tomo · 2 of 61 frames shown]
[frame 20/61]
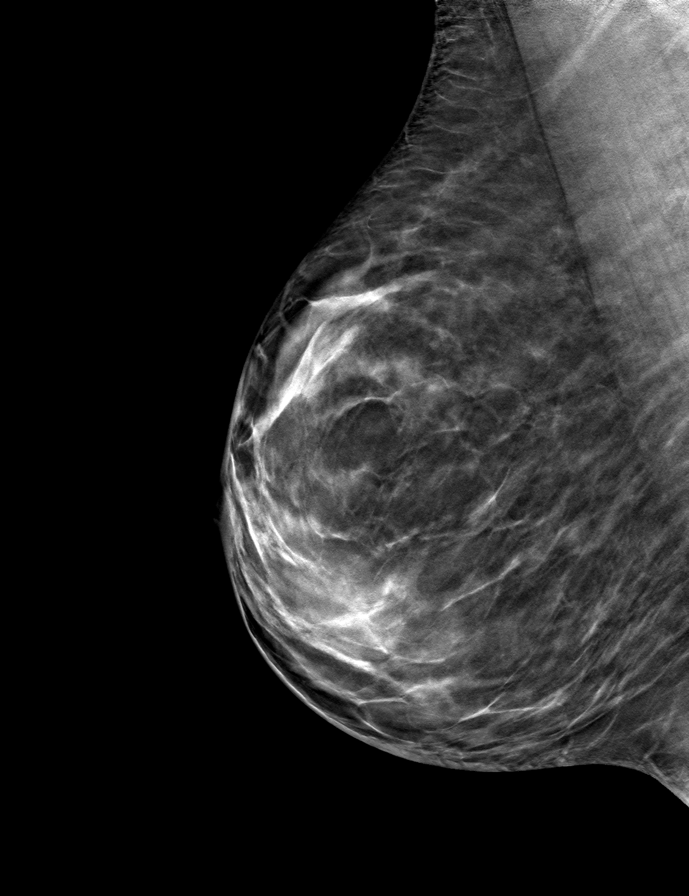
[frame 31/61]
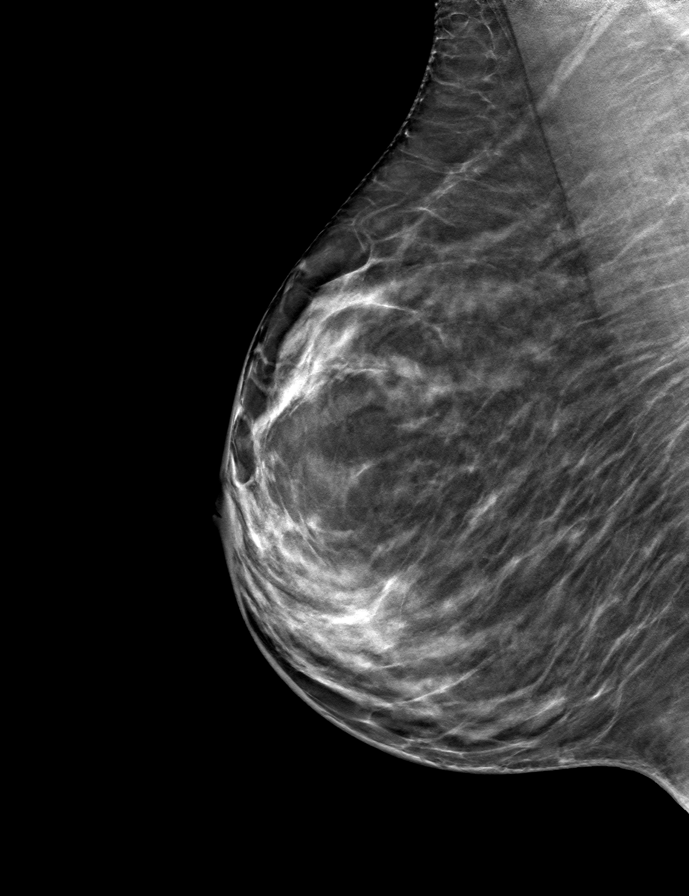

[L CC tomo · tomo slice 31/61.0]
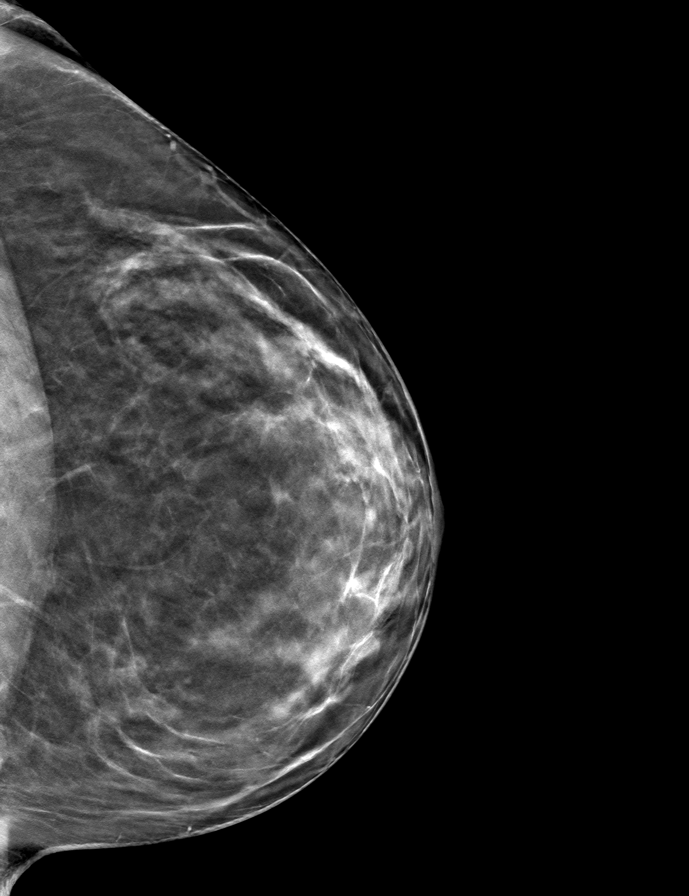

[L MLO tomo · tomo slice 29/57.0]
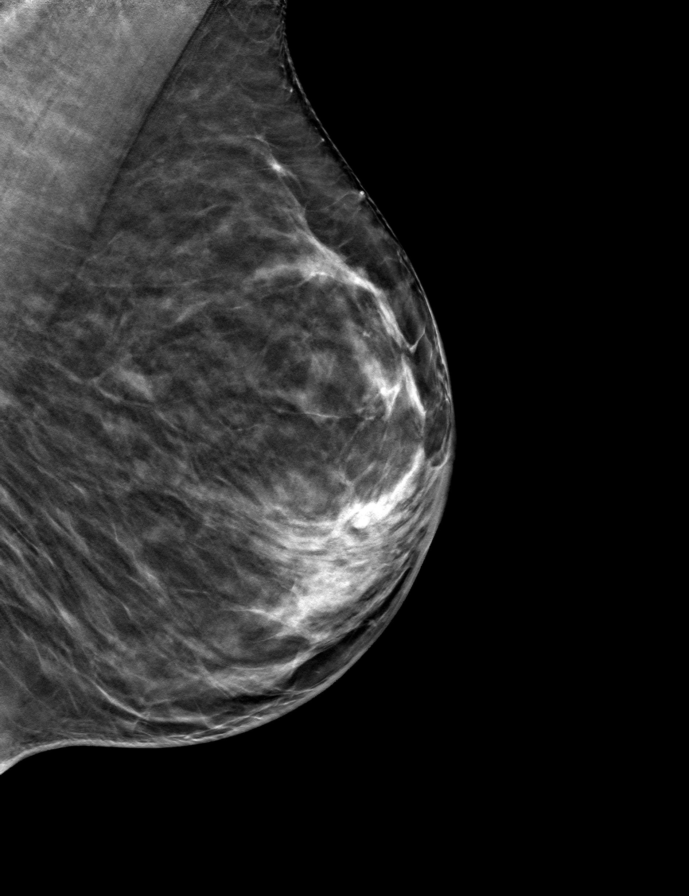

[R CC tomo · tomo slice 27/54.0]
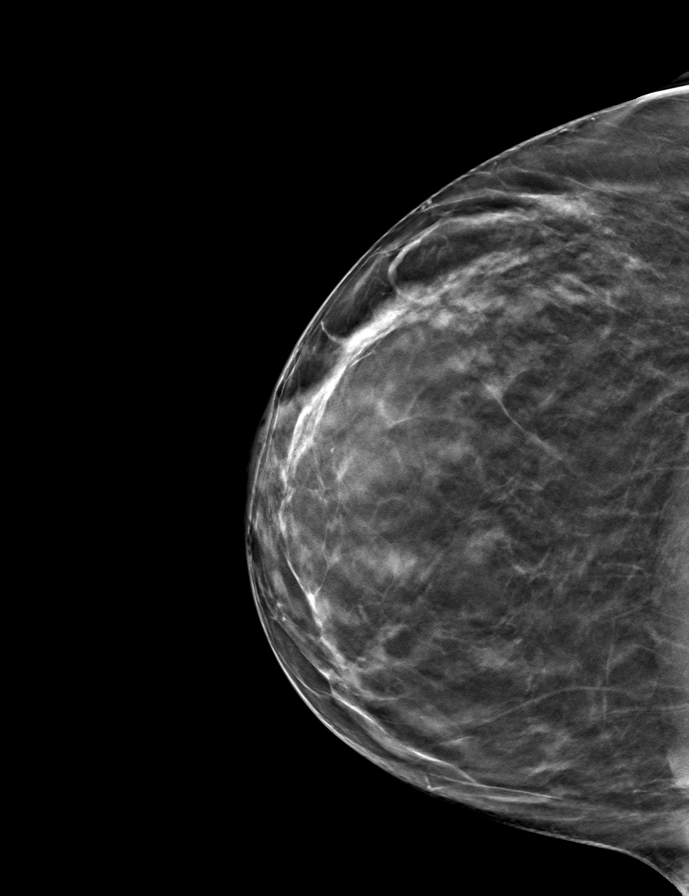

[9 of 24 positions shown; findings below may reference images not displayed]

ACR Breast Density Category c: The breast tissue is heterogeneously
dense, which may obscure small masses.
FINDINGS: There are no findings suspicious for malignancy.
IMPRESSION: No mammographic evidence of malignancy. A result letter of this
screening mammogram will be mailed directly to the patient.

RECOMMENDATION:
Screening mammogram in one year. (Code:Q3-W-BC3)

BI-RADS CATEGORY  1: Negative.

## 2024-03-01 DIAGNOSIS — G720 Drug-induced myopathy: Secondary | ICD-10-CM | POA: Diagnosis not present

## 2024-03-01 DIAGNOSIS — I447 Left bundle-branch block, unspecified: Secondary | ICD-10-CM | POA: Diagnosis not present

## 2024-03-01 DIAGNOSIS — E7800 Pure hypercholesterolemia, unspecified: Secondary | ICD-10-CM | POA: Diagnosis not present

## 2024-03-01 DIAGNOSIS — T466X5A Adverse effect of antihyperlipidemic and antiarteriosclerotic drugs, initial encounter: Secondary | ICD-10-CM | POA: Diagnosis not present

## 2024-03-01 DIAGNOSIS — R002 Palpitations: Secondary | ICD-10-CM | POA: Diagnosis not present

## 2024-03-01 DIAGNOSIS — I480 Paroxysmal atrial fibrillation: Secondary | ICD-10-CM | POA: Diagnosis not present

## 2024-03-01 DIAGNOSIS — R001 Bradycardia, unspecified: Secondary | ICD-10-CM | POA: Diagnosis not present

## 2024-03-01 DIAGNOSIS — I2510 Atherosclerotic heart disease of native coronary artery without angina pectoris: Secondary | ICD-10-CM | POA: Diagnosis not present

## 2024-03-15 DIAGNOSIS — I4891 Unspecified atrial fibrillation: Secondary | ICD-10-CM | POA: Diagnosis not present

## 2024-06-15 ENCOUNTER — Ambulatory Visit: Payer: Self-pay

## 2024-06-15 ENCOUNTER — Ambulatory Visit

## 2024-06-15 VITALS — BP 118/76 | HR 82 | Temp 102.4°F | Ht 68.0 in | Wt 155.0 lb

## 2024-06-15 DIAGNOSIS — U071 COVID-19: Secondary | ICD-10-CM | POA: Diagnosis not present

## 2024-06-15 DIAGNOSIS — R0989 Other specified symptoms and signs involving the circulatory and respiratory systems: Secondary | ICD-10-CM | POA: Diagnosis not present

## 2024-06-15 DIAGNOSIS — Z789 Other specified health status: Secondary | ICD-10-CM | POA: Diagnosis not present

## 2024-06-15 DIAGNOSIS — E782 Mixed hyperlipidemia: Secondary | ICD-10-CM | POA: Diagnosis not present

## 2024-06-15 DIAGNOSIS — Z974 Presence of external hearing-aid: Secondary | ICD-10-CM | POA: Diagnosis not present

## 2024-06-15 DIAGNOSIS — R509 Fever, unspecified: Secondary | ICD-10-CM | POA: Insufficient documentation

## 2024-06-15 LAB — POC INFLUENZA A&B (BINAX/QUICKVUE)
Influenza A, POC: NEGATIVE
Influenza B, POC: NEGATIVE

## 2024-06-15 LAB — POC COVID19 BINAXNOW: SARS Coronavirus 2 Ag: POSITIVE — AB

## 2024-06-15 MED ORDER — MOLNUPIRAVIR EUA 200MG CAPSULE: 4.0000 | ORAL_CAPSULE | Freq: Two times a day (BID) | ORAL | 0 refills | Status: AC

## 2024-06-15 NOTE — Progress Notes (Signed)
 "  Established Patient Office Visit TOC Dr. Maribeth     Subjective  Patient ID: Angelica Newton, female    DOB: 09-04-1950  Age: 74 y.o. MRN: [id number]  Chief Complaint  Patient presents with   Establish Care   Nasal Congestion    Discussed the use of AI scribe software for clinical note transcription with the patient, who gave verbal consent to proceed.  History of Present Illness Angelica Newton is a 74 year old female presenting for transfer of care from previous PCP.   - She began experiencing runny nose, chills for one day. No cough or sore throat is present. She spent time with her grandchildren during Christmas, some of whom were ill. No changes in appetite or taste, and no body aches, except for occasional back pain, which she attributes to her medication, Repatha. She is due for influenza and COVID-19 immunization.   - Her medical history includes atrial fibrillation, managed with Eliquis taken twice daily. She started Repatha in early October and follows up with cardiology at [facility]. She has a h/o statin intolerance ( which caused flu-like symptoms).   - She uses hearing aids and has a history of high cholesterol and left bundle branch block. She is concerned about her memory, as her mother had dementia. She maintains a healthy diet, regularly consuming Greek yogurt, fruits, and vegetables, and limits red meat to once a month.  - She takes amoxicillin as recommended by her surgeon following her hip replacements. She lives nearby and maintains an active lifestyle.    ROS As per HPI    Objective:     BP 118/76 (BP Location: Right Arm, Patient Position: Sitting)   Pulse 82   Temp (!) 102.4 F (39.1 C) (Oral)   Ht 5' 8 (1.727 m)   Wt 155 lb (70.3 kg)   SpO2 97%   BMI 23.57 kg/m      06/15/2024    3:08 PM 02/03/2024   10:21 AM 10/16/2023    2:27 PM  Depression screen PHQ 2/9  Decreased Interest 0 0 0  Down, Depressed, Hopeless 0 0 0   PHQ - 2 Score 0 0 0  Altered sleeping 1 0 0  Tired, decreased energy 0 0 0  Change in appetite 0 0 0  Feeling bad or failure about yourself  0 0 0  Trouble concentrating 0 0 0  Moving slowly or fidgety/restless 0 0 0  Suicidal thoughts 0 0 0  PHQ-9 Score 1 0  0   Difficult doing work/chores Not difficult at all Not difficult at all Not difficult at all     Data saved with a previous flowsheet row definition      06/15/2024    3:08 PM 10/16/2023    2:27 PM 08/27/2022    9:07 AM  GAD 7 : Generalized Anxiety Score  Nervous, Anxious, on Edge 0 0 0  Control/stop worrying 0 0 0  Worry too much - different things 0 0 0  Trouble relaxing 0 0 0  Restless 0 0 0  Easily annoyed or irritable 0 0 0  Afraid - awful might happen 0 0 0  Total GAD 7 Score 0 0 0  Anxiety Difficulty Not difficult at all Not difficult at all Not difficult at all      06/15/2024    3:08 PM 02/03/2024   10:21 AM 10/16/2023    2:27 PM  Depression screen PHQ 2/9  Decreased Interest 0 0  0  Down, Depressed, Hopeless 0 0 0  PHQ - 2 Score 0 0 0  Altered sleeping 1 0 0  Tired, decreased energy 0 0 0  Change in appetite 0 0 0  Feeling bad or failure about yourself  0 0 0  Trouble concentrating 0 0 0  Moving slowly or fidgety/restless 0 0 0  Suicidal thoughts 0 0 0  PHQ-9 Score 1 0  0   Difficult doing work/chores Not difficult at all Not difficult at all Not difficult at all     Data saved with a previous flowsheet row definition      06/15/2024    3:08 PM 10/16/2023    2:27 PM 08/27/2022    9:07 AM  GAD 7 : Generalized Anxiety Score  Nervous, Anxious, on Edge 0 0 0  Control/stop worrying 0 0 0  Worry too much - different things 0 0 0  Trouble relaxing 0 0 0  Restless 0 0 0  Easily annoyed or irritable 0 0 0  Afraid - awful might happen 0 0 0  Total GAD 7 Score 0 0 0  Anxiety Difficulty Not difficult at all Not difficult at all Not difficult at all   SDOH Screenings   Food Insecurity: No Food Insecurity  (02/03/2024)  Housing: Unknown (02/03/2024)  Transportation Needs: No Transportation Needs (02/03/2024)  Utilities: Not At Risk (02/03/2024)  Alcohol Screen: Low Risk (02/03/2024)  Depression (PHQ2-9): Low Risk (06/15/2024)  Financial Resource Strain: Low Risk (02/03/2024)  Physical Activity: Sufficiently Active (02/03/2024)  Social Connections: Moderately Isolated (02/03/2024)  Stress: No Stress Concern Present (02/03/2024)  Tobacco Use: Medium Risk (06/15/2024)  Health Literacy: Adequate Health Literacy (02/03/2024)     Physical Exam Constitutional:      General: She is not in acute distress. HENT:     Head: Normocephalic and atraumatic.     Right Ear: Tympanic membrane normal. There is no impacted cerumen.     Left Ear: Tympanic membrane normal. There is no impacted cerumen.     Nose: Rhinorrhea (clear rhinorrhea) present.     Mouth/Throat:     Mouth: Mucous membranes are moist.     Pharynx: No oropharyngeal exudate.  Cardiovascular:     Rate and Rhythm: Normal rate.  Pulmonary:     Effort: Pulmonary effort is normal.     Breath sounds: Normal breath sounds. No wheezing or rales.  Abdominal:     General: Bowel sounds are normal.     Palpations: Abdomen is soft.  Musculoskeletal:     Cervical back: Neck supple. No rigidity.     Right lower leg: No edema.     Left lower leg: No edema.  Neurological:     Mental Status: She is alert and oriented to person, place, and time.  Psychiatric:        Mood and Affect: Mood normal.        Results for orders placed or performed in visit on 06/15/24  POC COVID-19  Result Value Ref Range   SARS Coronavirus 2 Ag Positive (A) Negative  POC Influenza A&B (Binax test)  Result Value Ref Range   Influenza A, POC Negative Negative   Influenza B, POC Negative Negative    The 10-year ASCVD risk score (Arnett DK, et al., 2019) is: 9.9%     Assessment & Plan:   Assessment & Plan COVID-19 A/W fever, rhinitis. Clinic testing positive for  COVID-19. Patient updated on result.  Symptom onset:06/14/24. Given h/o atrial fibrillation on  Eliquis treatment with Molnupiravir  800 mg twice day for 5 days. Prescription sent to the pharmacy.  Reviewed precautions including wearing a mask if around others, frequent hand hygiene, respiratory etiquette, and cleaning/disinfecting high-touch surfaces. Supportive care discussed, including rest, hydration, and use of OTC medications for fever, pain, cough, or congestion as needed. Seek urgent or emergency care for shortness of breath, chest pain, confusion, persistent high fever, worsening symptoms, or inability to tolerate oral intake. Orders:   molnupiravir  EUA (LAGEVRIO ) 200 mg CAPS capsule; Take 4 capsules (800 mg total) by mouth 2 (two) times daily for 5 days.  Runny nose From COVID-19, Influenza testing was negative. Plan per COVID-19.  Recommend updating influenza immunization once she feels better with current infection.  Orders:   POC COVID-19   POC Influenza A&B (Binax test)  Mixed hyperlipidemia With h/o statin intolerance, on Repatha per cardiology, continue. Check fasting lipid panel and CMP in future. Since patient has COVID-19 recommend getting lab after she is feeling better.  Orders:   Comprehensive metabolic panel with GFR; Future   Lipid panel; Future  Does use hearing aid Noted on exam.     Statin intolerance On Repatha, continue.      Return in about 6 months (around 12/13/2024) for Fasting labs at patient'c convinience, f/u with Dr. Abbey in 6 months.   Luke Abbey, MD "

## 2024-06-15 NOTE — Assessment & Plan Note (Deleted)
  Orders: .  POC COVID-19 .  POC Influenza A&B (Binax test)

## 2024-06-15 NOTE — Assessment & Plan Note (Addendum)
On Repatha, continue.

## 2024-06-15 NOTE — Assessment & Plan Note (Addendum)
 With h/o statin intolerance, on Repatha per cardiology, continue. Check fasting lipid panel and CMP in future. Since patient has COVID-19 recommend getting lab after she is feeling better.  Orders:   Comprehensive metabolic panel with GFR; Future   Lipid panel; Future

## 2024-06-15 NOTE — Patient Instructions (Addendum)
 Start Molnupiravir  800 mg orally every 12 hours for 5 days for COVID-19  When to seek urgent care Call our office or go to urgent care/ER if you develop: Trouble breathing or shortness of breath Chest pain or pressure Confusion or trouble staying awake Persistent vomiting or inability to keep fluids down Fever lasting more than 3-4 days or worsening symptoms  What to do now Rest and drink plenty of fluids. Use acetaminophen  (Tylenol ) for fever, body aches, headache, or sore throat. For congestion, you may use saline nasal spray, a humidifier, or steam from a shower. For cough, honey, warm fluids, or cough drops may help. Eat light meals as tolerated.  Prevent spreading illness: Stay home as much as possible until results are available. Avoid close contact with others, especially older adults, infants, pregnant individuals, or anyone with chronic medical conditions. Wash hands frequently with soap and water or use hand sanitizer. Cover coughs and sneezes; consider wearing a mask if around others.

## 2024-06-15 NOTE — Assessment & Plan Note (Addendum)
 From COVID-19, Influenza testing was negative. Plan per COVID-19.  Recommend updating influenza immunization once she feels better with current infection.  Orders:   POC COVID-19   POC Influenza A&B (Binax test)

## 2024-06-15 NOTE — Assessment & Plan Note (Signed)
 A/W fever, rhinitis. Clinic testing positive for COVID-19. Patient updated on result.  Symptom onset:06/14/24. Given h/o atrial fibrillation on Eliquis treatment with Molnupiravir  800 mg twice day for 5 days. Prescription sent to the pharmacy.  Reviewed precautions including wearing a mask if around others, frequent hand hygiene, respiratory etiquette, and cleaning/disinfecting high-touch surfaces. Supportive care discussed, including rest, hydration, and use of OTC medications for fever, pain, cough, or congestion as needed. Seek urgent or emergency care for shortness of breath, chest pain, confusion, persistent high fever, worsening symptoms, or inability to tolerate oral intake. Orders:   molnupiravir  EUA (LAGEVRIO ) 200 mg CAPS capsule; Take 4 capsules (800 mg total) by mouth 2 (two) times daily for 5 days.

## 2024-06-15 NOTE — Assessment & Plan Note (Addendum)
"   Noted on exam  "

## 2024-06-15 NOTE — Assessment & Plan Note (Deleted)
 SABRA

## 2024-10-19 ENCOUNTER — Encounter

## 2025-02-06 ENCOUNTER — Ambulatory Visit
